# Patient Record
Sex: Female | Born: 1995
Health system: Southern US, Community
[De-identification: ages and names within clinical notes are randomized; demographics above are authoritative.]

## PROBLEM LIST (undated history)

## (undated) ENCOUNTER — Inpatient Hospital Stay: Payer: Self-pay

## (undated) ENCOUNTER — Emergency Department: Admission: EM | Discharge status: Absent without leave | Payer: Medicaid Other

## (undated) ENCOUNTER — Inpatient Hospital Stay (HOSPITAL_COMMUNITY): Payer: Self-pay

## (undated) DIAGNOSIS — F32A Anxiety disorder, unspecified: Secondary | ICD-10-CM

## (undated) DIAGNOSIS — I10 Essential (primary) hypertension: Secondary | ICD-10-CM

## (undated) DIAGNOSIS — J45909 Unspecified asthma, uncomplicated: Secondary | ICD-10-CM

## (undated) DIAGNOSIS — D649 Anemia, unspecified: Secondary | ICD-10-CM

## (undated) DIAGNOSIS — Z5189 Encounter for other specified aftercare: Secondary | ICD-10-CM

## (undated) DIAGNOSIS — T7840XA Allergy, unspecified, initial encounter: Secondary | ICD-10-CM

## (undated) DIAGNOSIS — F419 Anxiety disorder, unspecified: Secondary | ICD-10-CM

## (undated) DIAGNOSIS — O09299 Supervision of pregnancy with other poor reproductive or obstetric history, unspecified trimester: Secondary | ICD-10-CM

## (undated) DIAGNOSIS — F329 Major depressive disorder, single episode, unspecified: Secondary | ICD-10-CM

## (undated) DIAGNOSIS — F410 Panic disorder [episodic paroxysmal anxiety] without agoraphobia: Secondary | ICD-10-CM

## (undated) DIAGNOSIS — O24419 Gestational diabetes mellitus in pregnancy, unspecified control: Secondary | ICD-10-CM

## (undated) HISTORY — DX: Allergy, unspecified, initial encounter: T78.40XA

## (undated) HISTORY — DX: Encounter for other specified aftercare: Z51.89

## (undated) HISTORY — DX: Gestational diabetes mellitus in pregnancy, unspecified control: O24.419

## (undated) HISTORY — PX: OTHER SURGICAL HISTORY: SHX169

## (undated) HISTORY — DX: Panic disorder (episodic paroxysmal anxiety): F41.0

## (undated) HISTORY — DX: Anemia, unspecified: D64.9

## (undated) HISTORY — PX: WISDOM TOOTH EXTRACTION: SHX21

---
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
## 2014-03-14 ENCOUNTER — Emergency Department: Payer: Self-pay | Admitting: Emergency Medicine

## 2014-03-14 LAB — CBC WITH DIFFERENTIAL/PLATELET
Basophil #: 0.1 10*3/uL (ref 0.0–0.1)
Basophil %: 0.7 %
Eosinophil #: 0 10*3/uL (ref 0.0–0.7)
Eosinophil %: 0.1 %
HCT: 37 % (ref 35.0–47.0)
HGB: 12.9 g/dL (ref 12.0–16.0)
Lymphocyte #: 1.4 10*3/uL (ref 1.0–3.6)
Lymphocyte %: 14.4 %
MCH: 31 pg (ref 26.0–34.0)
MCHC: 34.8 g/dL (ref 32.0–36.0)
MCV: 89 fL (ref 80–100)
Monocyte #: 0.5 x10 3/mm (ref 0.2–0.9)
Monocyte %: 4.6 %
Neutrophil #: 7.9 10*3/uL — ABNORMAL HIGH (ref 1.4–6.5)
Neutrophil %: 80.2 %
Platelet: 206 10*3/uL (ref 150–440)
RBC: 4.15 10*6/uL (ref 3.80–5.20)
RDW: 14.5 % (ref 11.5–14.5)
WBC: 9.8 10*3/uL (ref 3.6–11.0)

## 2014-03-14 LAB — BASIC METABOLIC PANEL
Anion Gap: 5 — ABNORMAL LOW (ref 7–16)
BUN: 8 mg/dL (ref 7–18)
Calcium, Total: 8.9 mg/dL — ABNORMAL LOW (ref 9.0–10.7)
Chloride: 108 mmol/L — ABNORMAL HIGH (ref 98–107)
Co2: 23 mmol/L (ref 21–32)
Creatinine: 0.64 mg/dL (ref 0.60–1.30)
EGFR (African American): >60
EGFR (Non-African Amer.): >60
Glucose: 74 mg/dL (ref 65–99)
Osmolality: 269 (ref 275–301)
Potassium: 3.7 mmol/L (ref 3.5–5.1)
Sodium: 136 mmol/L (ref 136–145)

## 2014-04-26 ENCOUNTER — Observation Stay: Payer: Self-pay | Admitting: Obstetrics and Gynecology

## 2014-06-18 ENCOUNTER — Observation Stay: Payer: Self-pay | Admitting: Obstetrics and Gynecology

## 2014-06-18 LAB — PIH PROFILE
Anion Gap: 10 (ref 7–16)
BUN: 9 mg/dL (ref 7–18)
Calcium, Total: 8.9 mg/dL — ABNORMAL LOW (ref 9.0–10.7)
Chloride: 108 mmol/L — ABNORMAL HIGH (ref 98–107)
Co2: 23 mmol/L (ref 21–32)
Creatinine: 0.65 mg/dL (ref 0.60–1.30)
EGFR (African American): >60
EGFR (Non-African Amer.): >60
Glucose: 81 mg/dL (ref 65–99)
HCT: 34.5 % — ABNORMAL LOW (ref 35.0–47.0)
HGB: 11.8 g/dL — ABNORMAL LOW (ref 12.0–16.0)
MCH: 30.8 pg (ref 26.0–34.0)
MCHC: 34.1 g/dL (ref 32.0–36.0)
MCV: 90 fL (ref 80–100)
Osmolality: 279 (ref 275–301)
Platelet: 160 10*3/uL (ref 150–440)
Potassium: 3.9 mmol/L (ref 3.5–5.1)
RBC: 3.82 10*6/uL (ref 3.80–5.20)
RDW: 15 % — ABNORMAL HIGH (ref 11.5–14.5)
SGOT(AST): 21 U/L (ref 0–26)
Sodium: 141 mmol/L (ref 136–145)
Uric Acid: 3.6 mg/dL (ref 3.0–5.8)
WBC: 9.1 10*3/uL (ref 3.6–11.0)

## 2014-06-19 ENCOUNTER — Inpatient Hospital Stay: Payer: Self-pay | Admitting: Obstetrics and Gynecology

## 2014-06-19 LAB — CBC WITH DIFFERENTIAL/PLATELET
Basophil #: 0 10*3/uL (ref 0.0–0.1)
Basophil %: 0.5 %
Eosinophil #: 0 10*3/uL (ref 0.0–0.7)
Eosinophil %: 0.4 %
HCT: 35.5 % (ref 35.0–47.0)
HGB: 12.2 g/dL (ref 12.0–16.0)
Lymphocyte #: 1.7 10*3/uL (ref 1.0–3.6)
Lymphocyte %: 17.7 %
MCH: 31 pg (ref 26.0–34.0)
MCHC: 34.4 g/dL (ref 32.0–36.0)
MCV: 90 fL (ref 80–100)
Monocyte #: 0.7 x10 3/mm (ref 0.2–0.9)
Monocyte %: 7.8 %
Neutrophil #: 6.9 10*3/uL — ABNORMAL HIGH (ref 1.4–6.5)
Neutrophil %: 73.6 %
Platelet: 162 10*3/uL (ref 150–440)
RBC: 3.94 10*6/uL (ref 3.80–5.20)
RDW: 14.3 % (ref 11.5–14.5)
WBC: 9.3 10*3/uL (ref 3.6–11.0)

## 2014-06-19 LAB — COMPREHENSIVE METABOLIC PANEL
Albumin: 2.7 g/dL — ABNORMAL LOW (ref 3.8–5.6)
Alkaline Phosphatase: 127 U/L — ABNORMAL HIGH
Anion Gap: 11 (ref 7–16)
BUN: 7 mg/dL (ref 7–18)
Bilirubin,Total: 0.2 mg/dL (ref 0.2–1.0)
Calcium, Total: 8.5 mg/dL — ABNORMAL LOW (ref 9.0–10.7)
Chloride: 109 mmol/L — ABNORMAL HIGH (ref 98–107)
Co2: 20 mmol/L — ABNORMAL LOW (ref 21–32)
Creatinine: 0.65 mg/dL (ref 0.60–1.30)
EGFR (African American): >60
EGFR (Non-African Amer.): >60
Glucose: 86 mg/dL (ref 65–99)
Osmolality: 277 (ref 275–301)
Potassium: 4 mmol/L (ref 3.5–5.1)
SGOT(AST): 27 U/L — ABNORMAL HIGH (ref 0–26)
SGPT (ALT): 15 U/L
Sodium: 140 mmol/L (ref 136–145)
Total Protein: 6.7 g/dL (ref 6.4–8.6)

## 2014-06-19 LAB — PROTEIN / CREATININE RATIO, URINE
Creatinine, Urine: 79.2 mg/dL (ref 30.0–125.0)
Protein, Random Urine: 37 mg/dL — ABNORMAL HIGH (ref 0–12)
Protein/Creat. Ratio: 467 mg/gCREAT — ABNORMAL HIGH (ref 0–200)

## 2014-06-19 LAB — GC/CHLAMYDIA PROBE AMP

## 2014-06-20 LAB — HEMATOCRIT: HCT: 30.5 % — ABNORMAL LOW (ref 35.0–47.0)

## 2014-10-04 NOTE — L&D Delivery Note (Signed)
Obstetrical Delivery Note   Date of Delivery:   05/22/2015 at 0804 Primary OB:   [facility] Gestational Age/EDD: [redacted]w[redacted]d  Antepartum complications: preeclampsia with mild features, anemia  Delivered By:   Farrel Conners, CNM  Delivery Type:   spontaneous vaginal delivery  Procedure Details:   Began pushing when anterior lip. Was having variable decelerations after AROM, but progressed rapidly from 6 to 10 cm. Patient pushed effectively and cervix was reduced. After delivery of the head, there was a shoulder dystocia which did respond to [facility] and suprapubic pressure. Shoulder cord was reduced with birth. Baby had poor tone and was not breathing effectively, so cord was clamped then cut and baby was handed to neonatal nurse. Baby responded to tactile stimulation. Placenta appeared spontaneously at vaginal introitus shortly after birth and was easily delivered. Anesthesia:    none Intrapartum complications: Shoulder Dystocia and variable decelerations. Shoulder cord GBS:    negative Laceration:    n/a Episiotomy:    none Placenta:    Via active 3rd stage. To pathology: no Estimated Blood Loss:  400 ml  Baby:    Liveborn female, Apgars 6/9, weight 9#3oz (4170 gms)    Shaketha Jeon, CNM

## 2014-11-07 LAB — OB RESULTS CONSOLE RUBELLA ANTIBODY, IGM: Rubella: IMMUNE

## 2014-11-07 LAB — OB RESULTS CONSOLE GC/CHLAMYDIA
Chlamydia: NEGATIVE
Gonorrhea: NEGATIVE

## 2014-11-07 LAB — OB RESULTS CONSOLE RPR: RPR: NONREACTIVE

## 2014-11-07 LAB — OB RESULTS CONSOLE HIV ANTIBODY (ROUTINE TESTING): HIV: NONREACTIVE

## 2014-11-07 LAB — OB RESULTS CONSOLE ABO/RH: RH Type: POSITIVE

## 2014-11-07 LAB — OB RESULTS CONSOLE HEPATITIS B SURFACE ANTIGEN: Hepatitis B Surface Ag: NEGATIVE

## 2014-11-07 LAB — OB RESULTS CONSOLE ANTIBODY SCREEN: Antibody Screen: NEGATIVE

## 2015-02-11 NOTE — H&P (Signed)
L&D Evaluation:  History:  HPI -CC: HA, spots in vision and h/o dizziness -HPI: [age] y /o G1 @ 39/5 with above CC. Preg c/b h/o panic d/o and currently on zoloft. Patient states that her HAs began about 3 weeks ago and have been stable but persisting. She states that it occurs every day and lasts for a few hours, with spots in her vision occuring for about 2mins at the start of the HA. The HA is frontal and on both sides of her HA and has no aggravating or alleviating s/s. She doesn't drink any caffeiene, smoke and denies any GI or OB s/s except for occasional cramping. She has never tried any apap during these episodes and denies any current s/s and she denies any s/s prior to three weeks ago   She states that she had a h/o of some dizziness earlier in pregnancy but was seen by Dr. Dear and had a negative eval, including an ECG.  She has been evaluated a few times this pregnancy and has always had negative pre-eclampsia lab eval and has never been hypertensive.   Allergies albuterol, benadryl, buspar   Social History none   Exam:  Vital Signs AF VS normal and stable   General no apparent distress   Mental Status clear   Chest ctab   Heart rrr no mrgs   Abdomen gravid, non-tender   Reflexes 2+  brachial   FHT 130 baseline, +accels, no decels, mod var   Ucx q5959m   Impression:  Impression negative pre-eclampsia lab evaluation. ?migraine HAs   Plan:  Comments *IUP: rNST. no s/s of labor *HA: I told her that it sounds suspicious for migraines but options are limited with pregnancy. I told her that she can do apap prn, since she's never tried this; expectant management; or very limited fioricet use due to risk of rebound HA. I told her that I was hopeful that since it started so late in pregnancy that it'll resolve PP. CBC and CMP negative still; no need for PC ratio given recent 24hr urine and it wouldn't change her management.   She states that since it's not that severe and so  close to her due date that she'll do just apap only; she was counseled to avoid NSAIDs and call if she'd like a script for fioricet  She was amenable to this plan and has an NST/AFI already scheduled for her regular PNV.   Follow Up Appointment already scheduled   Electronic Signatures: [place] BingPickens, Leah Skora (MD)  (Signed 15-Sep-15 19:10)  Authored: L&D Evaluation   Last Updated: 15-Sep-15 19:10 by [place] BingPickens, Whittley Carandang (MD)

## 2015-02-11 NOTE — H&P (Signed)
L&D Evaluation:  History:  HPI -CC: SROM @ 2200 yesterday -HPI: [age] y /o G1 @ 39/6 with above CC. Preg c/b h/o panic d/o and currently on zoloft. SROM (fluid color unknown) at 2200 yesterday. Feeling regular UCs  (unsure frequency) and some blood streaks with fluid  She denies any HA,blurry vision/spots in vision   Patient's Medical History panic disorder   Patient's Surgical History none   Medications zoloft   Allergies albuterol, benadryl, buspar   Social History none   Exam:  Vital Signs AF VS normal and stable except for BP 130s-150s/80s-90s   General no apparent distress   Mental Status clear   Chest ctab   Heart rrr no mrgs   Abdomen gravid, non-tender   Estimated Fetal Weight Average for gestational age, 603600gm   Fetal Position ceph   Reflexes 2+  brachial   Pelvic 3/50 at 0500 (pt previously 3cm in the office)   Mebranes Ruptured, clear fluid, cx visually closed   FHT 120 baseline, +accels, no decels, mod var   Ucx irregular   Skin dry   Impression:  Impression s/p SROM with ?gHTN; pt stable   Plan:  Comments *ZOX:WRUEAVWUUP:category I tracing *gHTN: trend BPs q2963m and rpt urine and serum studies sent. no neuro s/s *SROM: pitocin augmentation given lack of cx change and regular UCs s/p SROM 7hrs ago to help decrease infection risk *GBS neg *psych: continue zoloft 50 qday  A pos/RI/Var NI/rpr neg/hiv and hepB neg/tdap unknown   Follow Up Appointment already scheduled   Electronic Signatures: [place] BingPickens, Odeal Welden (MD)  (Signed 16-Sep-15 06:26)  Authored: L&D Evaluation   Last Updated: 16-Sep-15 06:26 by [place] BingPickens, Zorana Brockwell (MD)

## 2015-02-12 ENCOUNTER — Emergency Department: Admission: EM | Admit: 2015-02-12 | Discharge: 2015-02-12 | Discharge status: Absent without leave | Payer: Self-pay

## 2015-02-12 ENCOUNTER — Observation Stay
Admission: RE | Admit: 2015-02-12 | Discharge: 2015-02-13 | Disposition: A | Discharge status: Home / Self Care | Payer: Medicaid Other | Source: Ambulatory Visit | Attending: Obstetrics & Gynecology | Admitting: Obstetrics & Gynecology

## 2015-02-12 ENCOUNTER — Encounter: Payer: Self-pay | Admitting: *Deleted

## 2015-02-12 DIAGNOSIS — Z3A26 26 weeks gestation of pregnancy: Secondary | ICD-10-CM | POA: Diagnosis not present

## 2015-02-12 DIAGNOSIS — O219 Vomiting of pregnancy, unspecified: Secondary | ICD-10-CM | POA: Diagnosis present

## 2015-02-12 DIAGNOSIS — Z3492 Encounter for supervision of normal pregnancy, unspecified, second trimester: Secondary | ICD-10-CM

## 2015-02-12 DIAGNOSIS — R112 Nausea with vomiting, unspecified: Secondary | ICD-10-CM | POA: Diagnosis present

## 2015-02-12 DIAGNOSIS — O2342 Unspecified infection of urinary tract in pregnancy, second trimester: Secondary | ICD-10-CM | POA: Diagnosis not present

## 2015-02-12 DIAGNOSIS — Z3490 Encounter for supervision of normal pregnancy, unspecified, unspecified trimester: Secondary | ICD-10-CM

## 2015-02-12 DIAGNOSIS — O212 Late vomiting of pregnancy: Principal | ICD-10-CM | POA: Insufficient documentation

## 2015-02-12 HISTORY — DX: Anxiety disorder, unspecified: F41.9

## 2015-02-12 HISTORY — DX: Depression, unspecified: F32.A

## 2015-02-12 HISTORY — DX: Major depressive disorder, single episode, unspecified: F32.9

## 2015-02-12 LAB — URINALYSIS COMPLETE WITH MICROSCOPIC (ARMC ONLY)
Bilirubin Urine: NEGATIVE
Glucose, UA: NEGATIVE mg/dL
Hgb urine dipstick: NEGATIVE
Nitrite: NEGATIVE
Protein, ur: NEGATIVE mg/dL
Specific Gravity, Urine: 1.014 (ref 1.005–1.030)
pH: 7 (ref 5.0–8.0)

## 2015-02-12 LAB — CBC
HCT: 29.7 % — ABNORMAL LOW (ref 35.0–47.0)
Hemoglobin: 10.1 g/dL — ABNORMAL LOW (ref 12.0–16.0)
MCH: 29.2 pg (ref 26.0–34.0)
MCHC: 33.9 g/dL (ref 32.0–36.0)
MCV: 86.3 fL (ref 80.0–100.0)
Platelets: 175 10*3/uL (ref 150–440)
RBC: 3.44 MIL/uL — ABNORMAL LOW (ref 3.80–5.20)
RDW: 13.8 % (ref 11.5–14.5)
WBC: 7.8 10*3/uL (ref 3.6–11.0)

## 2015-02-12 LAB — BASIC METABOLIC PANEL
Anion gap: 7 (ref 5–15)
BUN: 12 mg/dL (ref 6–20)
CO2: 22 mmol/L (ref 22–32)
Calcium: 8 mg/dL — ABNORMAL LOW (ref 8.9–10.3)
Chloride: 107 mmol/L (ref 101–111)
Creatinine, Ser: 0.45 mg/dL (ref 0.44–1.00)
GFR calc Af Amer: >60 mL/min (ref >60)
GFR calc non Af Amer: >60 mL/min (ref >60)
Glucose, Bld: 75 mg/dL (ref 65–99)
Potassium: 3.7 mmol/L (ref 3.5–5.1)
Sodium: 136 mmol/L (ref 135–145)

## 2015-02-12 MED ORDER — LACTATED RINGERS IV SOLN
INTRAVENOUS | Status: DC
  Administered 2015-02-12 (×3): via INTRAVENOUS
  Administered 2015-02-12: 500 mL via INTRAVENOUS
  Administered 2015-02-13: 06:00:00 via INTRAVENOUS

## 2015-02-12 MED ORDER — ONDANSETRON HCL 4 MG/2ML IJ SOLN
4.0000 mg | INTRAMUSCULAR | Status: DC | PRN
  Administered 2015-02-12: 4 mg via INTRAVENOUS

## 2015-02-12 MED ORDER — CEFTRIAXONE SODIUM IN DEXTROSE 20 MG/ML IV SOLN
1.0000 g | Freq: Two times a day (BID) | INTRAVENOUS | Status: DC
  Administered 2015-02-12 – 2015-02-13 (×3): 1 g via INTRAVENOUS
  Filled 2015-02-12 (×5): qty 50

## 2015-02-12 MED ORDER — ONDANSETRON HCL 4 MG/2ML IJ SOLN
INTRAMUSCULAR | Status: AC
  Administered 2015-02-12: 4 mg via INTRAVENOUS
  Filled 2015-02-12: qty 2

## 2015-02-12 MED ORDER — FAMOTIDINE IN NACL 20-0.9 MG/50ML-% IV SOLN
INTRAVENOUS | Status: AC
  Administered 2015-02-12: 20 mg via INTRAVENOUS
  Filled 2015-02-12: qty 50

## 2015-02-12 MED ORDER — FAMOTIDINE IN NACL 20-0.9 MG/50ML-% IV SOLN
20.0000 mg | Freq: Two times a day (BID) | INTRAVENOUS | Status: DC
  Administered 2015-02-12 – 2015-02-13 (×2): 20 mg via INTRAVENOUS
  Filled 2015-02-12: qty 50

## 2015-02-12 MED ORDER — LACTATED RINGERS IV SOLN: 500.0000 mL | INTRAVENOUS | Status: DC | PRN

## 2015-02-12 MED ORDER — ACETAMINOPHEN 500 MG PO TABS
1000.0000 mg | ORAL_TABLET | Freq: Four times a day (QID) | ORAL | Status: DC | PRN
  Administered 2015-02-12 – 2015-02-13 (×2): 1000 mg via ORAL
  Filled 2015-02-12 (×2): qty 2

## 2015-02-12 NOTE — H&P (Signed)
19 year old G2 P1001 with EDC=05/26/2015 by LMP 08/19/2014 presents at 25 2/7 weeks with c/o nausea and vomiting x2 days. She was seen in office yesterday for same problem. Urine dipstick at that time showed large leukocytes, small ketones, and +1 protein. Attempted OP treatment with Zofran ODT and cephalexin. Patient reports taking the Zofran twice, and vomiting once at home and once on arrival to hospital today. Able to keep down some gingerale. Denies diarrhea, fever. Has had some abdominal cramping and heartburn. Was feeling BH ctxs yesterday and had some LOF x 1 getting out of shower. SSE yesterday was negative as was wet prep. Cervix L/T/CL/OOP.  [facility]/depression (currently on zoloft 50 mgm), short interconceptual spacing with SVD 06/2014.   O: VSS. Afebrile General : in no acute distress, appears comfortable Mouth: moist mucous membranes Heart: RRR without murmur Lungs: CTA ABDomen: soft, mild tenderness in upper abdomen, BS active Uterus NT FHTs WNL with baseline 145 and accels to 160s Toco: No ctxs seen  A: Nausea and vomiting at 25 2/7 weeks Suspected UTI P: IV hydration, IV Zofran and IV pepcid  already begun. DC external fetal monitors Rocephin for UTI coverage. Urine culture pending at office Clear liquid sips Will transfer to floor

## 2015-02-12 NOTE — OB Triage Note (Signed)
Presents complaining of n/v since the evening of 02/10/2015.  States that she hasn't kept anything down since then with exception of some water on 5/10 and Ginger ale on 5/10.

## 2015-02-13 DIAGNOSIS — O219 Vomiting of pregnancy, unspecified: Secondary | ICD-10-CM | POA: Diagnosis present

## 2015-02-13 DIAGNOSIS — O212 Late vomiting of pregnancy: Secondary | ICD-10-CM | POA: Diagnosis not present

## 2015-02-13 MED ORDER — FAMOTIDINE 20 MG PO TABS: 20.0000 mg | ORAL_TABLET | Freq: Two times a day (BID) | ORAL | Status: DC

## 2015-02-13 NOTE — Progress Notes (Signed)
Patient understands all discharge instructions and the need to make follow up appointments. Patient discharge via wheelchair with auxillary. 

## 2015-02-13 NOTE — Discharge Instructions (Addendum)
Hyperemesis Gravidarum °Hyperemesis gravidarum is a severe form of nausea and vomiting that happens during pregnancy. Hyperemesis is worse than morning sickness. It may cause you to have nausea or vomiting all day for many days. It may keep you from eating and drinking enough food and liquids. Hyperemesis usually occurs during the first half (the first 20 weeks) of pregnancy. It often goes away once a woman is in her second half of pregnancy. However, sometimes hyperemesis continues through an entire pregnancy.  °CAUSES  °The cause of this condition is not completely known but is thought to be related to changes in the body's hormones when pregnant. It could be from the high level of the pregnancy hormone or an increase in estrogen in the body.  °SIGNS AND SYMPTOMS  °· Severe nausea and vomiting. °· Nausea that does not go away. °· Vomiting that does not allow you to keep any food down. °· Weight loss and body fluid loss (dehydration). °· Having no desire to eat or not liking food you have previously enjoyed. °DIAGNOSIS  °Your health care provider will do a physical exam and ask you about your symptoms. He or she may also order blood tests and urine tests to make sure something else is not causing the problem.  °TREATMENT  °You may only need medicine to control the problem. If medicines do not control the nausea and vomiting, you will be treated in the hospital to prevent dehydration, increased acid in the blood (acidosis), weight loss, and changes in the electrolytes in your body that may harm the unborn baby (fetus). You may need IV fluids.  °HOME CARE INSTRUCTIONS  °· Only take over-the-counter or prescription medicines as directed by your health care provider. °· Try eating a couple of dry crackers or toast in the morning before getting out of bed. °· Avoid foods and smells that upset your stomach. °· Avoid fatty and spicy foods. °· Eat 5-6 small meals a day. °· Do not drink when eating meals. Drink between  meals. °· For snacks, eat high-protein foods, such as cheese. °· Eat or suck on things that have ginger in them. Ginger helps nausea. °· Avoid food preparation. The smell of food can spoil your appetite. °· Avoid iron pills and iron in your multivitamins until after 3-4 months of being pregnant. However, consult with your health care provider before stopping any prescribed iron pills. °SEEK MEDICAL CARE IF:  °· Your abdominal pain increases. °· You have a severe headache. °· You have vision problems. °· You are losing weight. °SEEK IMMEDIATE MEDICAL CARE IF:  °· You are unable to keep fluids down. °· You vomit blood. °· You have constant nausea and vomiting. °· You have excessive weakness. °· You have extreme thirst. °· You have dizziness or fainting. °· You have a fever or persistent symptoms for more than 2-3 days. °· You have a fever and your symptoms suddenly get worse. °MAKE SURE YOU:  °· Understand these instructions. °· Will watch your condition. °· Will get help right away if you are not doing well or get worse. °Document Released: 09/20/2005 Document Revised: 07/11/2013 Document Reviewed: 05/02/2013 °ExitCare® Patient Information ©2015 [employer]. This information is not intended to replace advice given to you by your health care provider. Make sure you discuss any questions you have with your health care provider. ° °

## 2015-02-13 NOTE — Discharge Summary (Signed)
Antenatal Physician Discharge Summary  Patient ID: Erika Solomon MRN: [id number] DOB/AGE: [birth year] 21 y.o.Erika Solomon  Admit date: 02/12/2015 Discharge date: 02/13/2015  Admission Diagnoses: Nausea and vomiting in pregnancy, UTI  Discharge Diagnoses: same  Prenatal Procedures: none   Significant Diagnostic Studies:  Results for orders placed or performed during the hospital encounter of 02/12/15 (from the past 168 hour(s))  CBC   Collection Time: 02/12/15 10:41 AM  Result Value Ref Range   WBC 7.8 3.6 - 11.0 K/uL   RBC 3.44 (L) 3.80 - 5.20 MIL/uL   Hemoglobin 10.1 (L) 12.0 - 16.0 g/dL   HCT 95.629.7 (L) 38.735.0 - 56.447.0 %   MCV 86.3 80.0 - 100.0 fL   MCH 29.2 26.0 - 34.0 pg   MCHC 33.9 32.0 - 36.0 g/dL   RDW 33.213.8 95.111.5 - 88.414.5 %   Platelets 175 150 - 440 K/uL  Basic metabolic panel   Collection Time: 02/12/15 10:41 AM  Result Value Ref Range   Sodium 136 135 - 145 mmol/L   Potassium 3.7 3.5 - 5.1 mmol/L   Chloride 107 101 - 111 mmol/L   CO2 22 22 - 32 mmol/L   Glucose, Bld 75 65 - 99 mg/dL   BUN 12 6 - 20 mg/dL   Creatinine, Ser 1.660.45 0.44 - 1.00 mg/dL   Calcium 8.0 (L) 8.9 - 10.3 mg/dL   GFR calc non Af Amer >60 >60 mL/min   GFR calc Af Amer >60 >60 mL/min   Anion gap 7 5 - 15  Urinalysis complete, with microscopic [facility])   Collection Time: 02/12/15 2:46 PM  Result Value Ref Range   Color, Urine YELLOW (A) YELLOW   APPearance CLOUDY (A) CLEAR   Glucose, UA NEGATIVE NEGATIVE mg/dL   Bilirubin Urine NEGATIVE NEGATIVE   Ketones, ur TRACE (A) NEGATIVE mg/dL   Specific Gravity, Urine 1.014 1.005 - 1.030   Hgb urine dipstick NEGATIVE NEGATIVE   pH 7.0 5.0 - 8.0   Protein, ur NEGATIVE NEGATIVE mg/dL   Nitrite NEGATIVE NEGATIVE   Leukocytes, UA 3+ (A) NEGATIVE   RBC / HPF 0-5 0 - 5 RBC/hpf   WBC, UA 6-30 0 - 5 WBC/hpf   Bacteria,  UA RARE (A) NONE SEEN   Squamous Epithelial / LPF 6-30 (A) NONE SEEN   Mucous PRESENT    Hyaline Casts, UA PRESENT     Treatments: IV hydration and antibiotics: ceftriaxone  Hospital Course:  This is a 19 y.o. G2P1001 with IUP at 7374w3d admitted for nausea and vomiting of pregnancy. She was observed, fetal heart tones were obtained, and she had no signs/symptoms of preterm labor or other maternal-fetal concerns. She was given antiemetics and antibiotics and was able to tolerate po intake. She was deemed stable for discharge to home with outpatient follow up.  Discharge Exam: BP 115/67 mmHg  Pulse 71  Temp(Src) 98.6 F (37 C) (Oral)  Resp 20  Ht 5' 3.5" (1.613 m)  Wt 77.111 kg (170 lb)  BMI 29.64 kg/m2  SpO2 100%  LMP 08/19/2014 CVS exam: normal rate, regular rhythm, normal S1, S2, no murmurs, rubs, clicks or gallops. Lungs - Normal respiratory effort, chest expands symmetrically. Lungs are clear to auscultation, no crackles or wheezes. ABD: soft, non-tender to palpation, gravid abdomen   Discharge Condition: Stable  Disposition: stable for home     Medication List    TAKE these medications       famotidine 20 MG tablet  Commonly known as: PEPCID  Take  1 tablet (20 mg total) by mouth 2 (two) times daily.     ondansetron 4 MG disintegrating tablet  Commonly known as: ZOFRAN-ODT  Take 4 mg by mouth every 8 (eight) hours as needed for nausea or vomiting.     prenatal multivitamin Tabs tablet  Take 1 tablet by mouth daily.           Follow-up Information    [facility] Schedule an appointment as soon as possible for a visit in 3 weeks.   Why: keep scheduled 28 week appointment, will do labs at that visit   Contact information:   [phone]           Signed: Jannet MantisSubudhi, Jeslynn Hollander, CNM. 02/13/2015, 10:26 AM

## 2015-04-23 ENCOUNTER — Observation Stay
Admission: EM | Admit: 2015-04-23 | Discharge: 2015-04-24 | Payer: Medicaid Other | Attending: Obstetrics & Gynecology | Admitting: Obstetrics & Gynecology

## 2015-04-23 ENCOUNTER — Encounter: Payer: Self-pay | Admitting: *Deleted

## 2015-04-23 DIAGNOSIS — O368390 Maternal care for abnormalities of the fetal heart rate or rhythm, unspecified trimester, not applicable or unspecified: Secondary | ICD-10-CM | POA: Diagnosis present

## 2015-04-23 DIAGNOSIS — O368130 Decreased fetal movements, third trimester, not applicable or unspecified: Secondary | ICD-10-CM | POA: Insufficient documentation

## 2015-04-23 DIAGNOSIS — Z3A35 35 weeks gestation of pregnancy: Secondary | ICD-10-CM | POA: Insufficient documentation

## 2015-04-23 DIAGNOSIS — O4700 False labor before 37 completed weeks of gestation, unspecified trimester: Secondary | ICD-10-CM | POA: Diagnosis present

## 2015-04-23 LAB — URINALYSIS COMPLETE WITH MICROSCOPIC (ARMC ONLY)
Bacteria, UA: NONE SEEN
Bilirubin Urine: NEGATIVE
Glucose, UA: NEGATIVE mg/dL
Hgb urine dipstick: NEGATIVE
Nitrite: NEGATIVE
Protein, ur: NEGATIVE mg/dL
RBC / HPF: NONE SEEN RBC/hpf (ref 0–5)
Specific Gravity, Urine: 1.003 — ABNORMAL LOW (ref 1.005–1.030)
pH: 7 (ref 5.0–8.0)

## 2015-04-23 MED ORDER — NIFEDIPINE 10 MG PO CAPS
10.0000 mg | ORAL_CAPSULE | Freq: Once | ORAL | Status: AC
  Administered 2015-04-23: 10 mg via ORAL
  Filled 2015-04-23: qty 1

## 2015-04-23 MED ORDER — LACTATED RINGERS IV SOLN: 500.0000 mL | INTRAVENOUS | Status: DC | PRN

## 2015-04-23 MED ORDER — CEPHALEXIN 500 MG PO CAPS
500.0000 mg | ORAL_CAPSULE | Freq: Three times a day (TID) | ORAL | Status: DC
  Administered 2015-04-23: 500 mg via ORAL
  Filled 2015-04-23 (×5): qty 1

## 2015-04-23 NOTE — OB Triage Note (Signed)
Recvd to L&D obs 4 with c/o decreased fetal movement and increased discharge.  Changed to gown and to bed.  EFM applied.

## 2015-04-24 DIAGNOSIS — O4700 False labor before 37 completed weeks of gestation, unspecified trimester: Secondary | ICD-10-CM | POA: Diagnosis not present

## 2015-04-24 NOTE — Discharge Instructions (Signed)
Braxton Hicks Contractions °Contractions of the uterus can occur throughout pregnancy. Contractions are not always a sign that you are in labor.  °WHAT ARE BRAXTON HICKS CONTRACTIONS?  °Contractions that occur before labor are called Braxton Hicks contractions, or false labor. Toward the end of pregnancy (32-34 weeks), these contractions can develop more often and may become more forceful. This is not true labor because these contractions do not result in opening (dilatation) and thinning of the cervix. They are sometimes difficult to tell apart from true labor because these contractions can be forceful and people have different pain tolerances. You should not feel embarrassed if you go to the hospital with false labor. Sometimes, the only way to tell if you are in true labor is for your health care provider to look for changes in the cervix. °If there are no prenatal problems or other health problems associated with the pregnancy, it is completely safe to be sent home with false labor and await the onset of true labor. °HOW CAN YOU TELL THE DIFFERENCE BETWEEN TRUE AND FALSE LABOR? °False Labor °· The contractions of false labor are usually shorter and not as hard as those of true labor.   °· The contractions are usually irregular.   °· The contractions are often felt in the front of the lower abdomen and in the groin.   °· The contractions may go away when you walk around or change positions while lying down.   °· The contractions get weaker and are shorter lasting as time goes on.   °· The contractions do not usually become progressively stronger, regular, and closer together as with true labor.   °True Labor °· Contractions in true labor last 30-70 seconds, become very regular, usually become more intense, and increase in frequency.   °· The contractions do not go away with walking.   °· The discomfort is usually felt in the top of the uterus and spreads to the lower abdomen and low back.   °· True labor can be  determined by your health care provider with an exam. This will show that the cervix is dilating and getting thinner.   °WHAT TO REMEMBER °· Keep up with your usual exercises and follow other instructions given by your health care provider.   °· Take medicines as directed by your health care provider.   °· Keep your regular prenatal appointments.   °· Eat and drink lightly if you think you are going into labor.   °· If Braxton Hicks contractions are making you uncomfortable:   °¨ Change your position from lying down or resting to walking, or from walking to resting.   °¨ Sit and rest in a tub of warm water.   °¨ Drink 2-3 glasses of water. Dehydration may cause these contractions.   °¨ Do slow and deep breathing several times an hour.   °WHEN SHOULD I SEEK IMMEDIATE MEDICAL CARE? °Seek immediate medical care if: °· Your contractions become stronger, more regular, and closer together.   °· You have fluid leaking or gushing from your vagina.   °· You have a fever.   °· You pass blood-tinged mucus.   °· You have vaginal bleeding.   °· You have continuous abdominal pain.   °· You have low back pain that you never had before.   °· You feel your baby's head pushing down and causing pelvic pressure.   °· Your baby is not moving as much as it used to.   °Document Released: 09/20/2005 Document Revised: 09/25/2013 Document Reviewed: 07/02/2013 °ExitCare® Patient Information ©2015 [employer]. This information is not intended to replace advice given to you by your health care   provider. Make sure you discuss any questions you have with your health care provider. ° °

## 2015-04-24 NOTE — OB Triage Provider Note (Signed)
OB History & Physical   History of Present Illness:  Chief Complaint: Presented to office with c/o decreased FM since 19 July PM. And LOF x weeks. Had a NST in office which was reactive, but there were two fetal heart rate decelerations to 120 from a baseline of 150. She presents for monitoring and evaluation of c/o LOF/ Has been feeling some cramping, but denies VB, dysuria, vulvovaginal itching, or diarrhea. Has had nausea thru out her pregnancy and heartburn. Has not eaten all day 20 July, drinking some fluids.  HPI:  Erika Solomon is a 19 y.o. G3P1001 female with EDC=05/26/2015  at [redacted]w[redacted]d dated by LMP=9wk ultrasound.  Her pregnancy has been complicated by persistent nausea, close interconceptual spacing, hx of anxiety, and overnight hospitalization in second trimester for N/V. Has gained 20# with pregnancy.  A SSE was negative for PROM, and the AFI was 18 cm. The prolonged NST was nicely reactive, CAT1, but patient was contracting every 2-3 minutes on arrival. Her contractions spaced out a little with po hydration, but they resumed at  q3-5 min apart. Patient feeling more cramping with the contractions. Cervix changed slightly from FT/40% to 1cm and 50%. Urinalysis also with 6-30 WBCs and suspicious for a UTI. She was given Keflex and a dose of procardia at 2218. She was held for monitoring of labor contractions/ cervical change.    Prenatal care site: Prenatal care at [facility] has also been remarkable for early onset of care, normal anatomy scan except for single EIF, negative first trimester test, iron deficiency anemia, and mildly decreased vitamin B12 level. She had been on Zoloft earlier in pregnancy, but stopped taking. Has received one injection of vitamin B12 1000 mcg.       Maternal Medical History:   Past Medical History  Diagnosis Date  . Anxiety and depression   Iron deficiency anemia  Past Surgical History  Procedure Laterality Date  . No past surgery      Allergies   Allergen Reactions  . Albuterol Anaphylaxis  . Benadryl [Diphenhydramine] Anaphylaxis    Prior to Admission medications   Medication Sig Start Date End Date Taking? Authorizing Provider  famotidine (PEPCID) 20 MG tablet Take 1 tablet (20 mg total) by mouth 2 (two) times daily. 02/13/15   Chelsea C Ward, MD  ondansetron (ZOFRAN-ODT) 4 MG disintegrating tablet Take 4 mg by mouth every 8 (eight) hours as needed for nausea or vomiting.    Historical Provider, MD  Prenatal Vit-Fe Fumarate-FA (PRENATAL MULTIVITAMIN) TABS tablet Take 1 tablet by mouth daily.     Historical Provider, MD   UA: +1ketones, 1.003, 2+ leuks, 6-30 WBC, 0-5epi cells    Social History: She  reports that she has quit smoking. She does not have any smokeless tobacco history on file. She reports that she does not drink alcohol or use illicit drugs.  Family History: family history includes Diabetes in her maternal grandfather; Hypertension in her father and maternal grandmother.   Review of Systems: Negative x 10 systems reviewed except as noted in the HPI.      Physical Exam:  Vital Signs: BP 131/73 mmHg  Pulse 75  Temp(Src) 98.3 F (36.8 C) (Oral)  Resp 18  LMP 08/19/2014 General: no acute distress.  HEENT: normocephalic, atraumatic Heart: regular rate & rhythm.  No murmurs Lungs: clear to auscultation bilaterally Abdomen: soft, gravid, non-tender;  Pelvic:   External: Normal external female genitalia  Cervix: initially FT/30-40%/-2, then after 2 hours 1/50%/-3  Wet [place]: negative  clue cells, hyphae, Trichimonas  ROM: negative Nitrazine, ferning, pooling Extremities: non-tender, symmetric, no edema bilaterally.   Neurologic: Alert & oriented x 3.    Pertinent Results:  Prenatal Labs: Blood type/Rh A positive  Antibody screen negative  Rubella immune  RPR negative  HBsAg negative  HIV negative  GC negative  Chlamydia negative  Genetic screening Negative first trimester test.  1 hour GTT 94  3  hour GTT N/A  GBS unknown   Baseline FHR: 125-130 baseline and accelerations to 150s to 170, moderate variability, no decelerations Toco: contractions q4-5 minutesx 40 sec. 4.0cm+Bedside Ultrasound:  Number of Fetus: single  Presentation: vertex  Fluid: 18.1cm=4cm+ 4.3+5.3+4.5cm   Assessment:  Erika Solomon is a 19 y.o. G12P1001 female at [redacted]w[redacted]d with threatened preterm labor. Possible UTI  Plan:  1. Continue to monitor for cervical change 2. Urine culture 3. Continue po hydration.  Tierre Gerard  04/24/2015 3:52 AM

## 2015-04-25 NOTE — Progress Notes (Signed)
Late triage note for 04/24/2015 at 0430 AM   S: Patient comfortable, sleeping, no longer feeling lower abdominal cramping O:  Appears comfortable Baby very active. FHR 150 with accels to 170s to 180s, no decelerations Essentially acontractile over last 2 hours Cervical exam at 0051-no change  A: IUP at 35 3/7 weeks with threatened preterm labor-resolved Possible UTI  P: Discussed earlier with patient option for BMZ with preterm deliver Patient declines at this time.  Discharge home with preterm labor precautions RX for Keflex 500 mgm TID x 7 days Urine culture pending FU at [facility] as scheduled.  Farrel Conners, CNM

## 2015-04-26 LAB — URINE CULTURE

## 2015-05-21 ENCOUNTER — Encounter: Payer: Self-pay | Admitting: *Deleted

## 2015-05-21 ENCOUNTER — Inpatient Hospital Stay
Admission: EM | Admit: 2015-05-21 | Discharge: 2015-05-23 | DRG: 774 | Disposition: A | Discharge status: Home / Self Care | Payer: Medicaid Other | Attending: Obstetrics & Gynecology | Admitting: Obstetrics & Gynecology

## 2015-05-21 DIAGNOSIS — Z3490 Encounter for supervision of normal pregnancy, unspecified, unspecified trimester: Secondary | ICD-10-CM

## 2015-05-21 DIAGNOSIS — Z888 Allergy status to other drugs, medicaments and biological substances status: Secondary | ICD-10-CM

## 2015-05-21 DIAGNOSIS — Z87891 Personal history of nicotine dependence: Secondary | ICD-10-CM

## 2015-05-21 DIAGNOSIS — O9902 Anemia complicating childbirth: Secondary | ICD-10-CM | POA: Diagnosis present

## 2015-05-21 DIAGNOSIS — Z3A39 39 weeks gestation of pregnancy: Secondary | ICD-10-CM | POA: Diagnosis present

## 2015-05-21 DIAGNOSIS — O1403 Mild to moderate pre-eclampsia, third trimester: Secondary | ICD-10-CM | POA: Diagnosis present

## 2015-05-21 DIAGNOSIS — Z79899 Other long term (current) drug therapy: Secondary | ICD-10-CM | POA: Diagnosis not present

## 2015-05-21 DIAGNOSIS — O09299 Supervision of pregnancy with other poor reproductive or obstetric history, unspecified trimester: Secondary | ICD-10-CM | POA: Diagnosis present

## 2015-05-21 LAB — CBC
HCT: 29.7 % — ABNORMAL LOW (ref 35.0–47.0)
Hemoglobin: 9.7 g/dL — ABNORMAL LOW (ref 12.0–16.0)
MCH: 26.5 pg (ref 26.0–34.0)
MCHC: 32.8 g/dL (ref 32.0–36.0)
MCV: 80.8 fL (ref 80.0–100.0)
Platelets: 147 10*3/uL — ABNORMAL LOW (ref 150–440)
RBC: 3.67 MIL/uL — ABNORMAL LOW (ref 3.80–5.20)
RDW: 16.4 % — ABNORMAL HIGH (ref 11.5–14.5)
WBC: 7.9 10*3/uL (ref 3.6–11.0)

## 2015-05-21 LAB — COMPREHENSIVE METABOLIC PANEL
ALT: 12 U/L — ABNORMAL LOW (ref 14–54)
AST: 29 U/L (ref 15–41)
Albumin: 2.8 g/dL — ABNORMAL LOW (ref 3.5–5.0)
Alkaline Phosphatase: 81 U/L (ref 38–126)
Anion gap: 6 (ref 5–15)
BUN: 11 mg/dL (ref 6–20)
CO2: 22 mmol/L (ref 22–32)
Calcium: 8.3 mg/dL — ABNORMAL LOW (ref 8.9–10.3)
Chloride: 105 mmol/L (ref 101–111)
Creatinine, Ser: 0.62 mg/dL (ref 0.44–1.00)
GFR calc Af Amer: >60 mL/min (ref >60)
GFR calc non Af Amer: >60 mL/min (ref >60)
Glucose, Bld: 109 mg/dL — ABNORMAL HIGH (ref 65–99)
Potassium: 3.5 mmol/L (ref 3.5–5.1)
Sodium: 133 mmol/L — ABNORMAL LOW (ref 135–145)
Total Bilirubin: 0.3 mg/dL (ref 0.3–1.2)
Total Protein: 6 g/dL — ABNORMAL LOW (ref 6.5–8.1)

## 2015-05-21 LAB — PROTEIN / CREATININE RATIO, URINE
Creatinine, Urine: 183 mg/dL
Protein Creatinine Ratio: 0.5 mg/mg{Cre} — ABNORMAL HIGH (ref 0.00–0.15)
Total Protein, Urine: 91 mg/dL

## 2015-05-21 LAB — ABO/RH: ABO/RH(D): A POS

## 2015-05-21 LAB — TYPE AND SCREEN
ABO/RH(D): A POS
Antibody Screen: NEGATIVE

## 2015-05-21 MED ORDER — LACTATED RINGERS IV SOLN
500.0000 mL | INTRAVENOUS | Status: DC | PRN
  Administered 2015-05-22: 500 mL via INTRAVENOUS

## 2015-05-21 MED ORDER — DINOPROSTONE 10 MG VA INST
10.0000 mg | VAGINAL_INSERT | Freq: Once | VAGINAL | Status: AC
  Administered 2015-05-21: 10 mg via VAGINAL
  Filled 2015-05-21: qty 1

## 2015-05-21 MED ORDER — LABETALOL HCL 5 MG/ML IV SOLN: 20.0000 mg | INTRAVENOUS | Status: DC | PRN

## 2015-05-21 MED ORDER — OXYTOCIN 40 UNITS IN LACTATED RINGERS INFUSION - SIMPLE MED
62.5000 mL/h | INTRAVENOUS | Status: DC
  Administered 2015-05-22: 500 mL/h via INTRAVENOUS
  Filled 2015-05-21: qty 1000

## 2015-05-21 MED ORDER — OXYTOCIN BOLUS FROM INFUSION: 500.0000 mL | INTRAVENOUS | Status: DC

## 2015-05-21 MED ORDER — AMMONIA AROMATIC IN INHA: 0.3000 mL | Freq: Once | RESPIRATORY_TRACT | Status: DC | PRN

## 2015-05-21 MED ORDER — HYDRALAZINE HCL 20 MG/ML IJ SOLN: 5.0000 mg | INTRAMUSCULAR | Status: DC | PRN

## 2015-05-21 MED ORDER — ONDANSETRON HCL 4 MG/2ML IJ SOLN: 4.0000 mg | Freq: Four times a day (QID) | INTRAMUSCULAR | Status: DC | PRN

## 2015-05-21 MED ORDER — LACTATED RINGERS IV SOLN
INTRAVENOUS | Status: DC
  Administered 2015-05-21: 23:00:00 via INTRAVENOUS

## 2015-05-21 MED ORDER — MISOPROSTOL 200 MCG PO TABS
800.0000 ug | ORAL_TABLET | Freq: Once | ORAL | Status: DC | PRN
  Filled 2015-05-21: qty 4

## 2015-05-21 MED ORDER — LIDOCAINE HCL (PF) 1 % IJ SOLN
30.0000 mL | INTRAMUSCULAR | Status: DC | PRN
  Filled 2015-05-21: qty 30

## 2015-05-21 NOTE — OB Triage Note (Signed)
PIH evaluation sent from the office.

## 2015-05-21 NOTE — H&P (Signed)
HISTORY AND PHYSICAL  HISTORY OF PRESENT ILLNESS: Erika Solomon is a 19 y.o. G2P1001 at [redacted]w[redacted]d by LMP consistent with a 9 week ultrasound with a pregnancy complicated by preeclampsia without severe features presenting for induction of labor. Patient was sent from office today with elevated blood pressures and +1 proteinuria. Blood pressures in the L&D were 130s/78 to 94. PIH labs were essentially WNL (LFTS, BUN, CR) except for mild thrombocytopenia (147K) and a PC ratio of 500 mgm. She reports intermittent RUQ pain, dizziness and bitemporal headaches.  She has been having contractions Q5 min when arrived on unit for [facility] evaluation but denied leakage of fluid, frank vaginal bleeding, or decreased fetal movement. She has had a little spotting after a cervical exam this week.  Prenatal care at [facility] remarkable for anxiety and depression (was on Zoloft earlier this pregnancy), short interconceptual spacing, anemia, reflux, and a history of preeclampsia with G1. Had an SVD after a Pitocin augmented labor  In Sept 2015 delivering a 7#1 oz son.   HISTORY:  Past Medical History  Diagnosis Date  . Anxiety and depression     Past Surgical History  Procedure Laterality Date  . No past surgery      No current facility-administered medications on file prior to encounter.   Current Outpatient Prescriptions on File Prior to Encounter  Medication Sig Dispense Refill  . famotidine (PEPCID) 20 MG tablet Take 1 tablet (20 mg total) by mouth 2 (two) times daily. 60 tablet 4  . ondansetron (ZOFRAN-ODT) 4 MG disintegrating tablet Take 4 mg by mouth every 8 (eight) hours as needed for nausea or vomiting.    . Prenatal Vit-Fe Fumarate-FA (PRENATAL MULTIVITAMIN) TABS tablet Take 1 tablet by mouth daily.        Allergies  Allergen Reactions  . Albuterol Anaphylaxis  . Benadryl [Diphenhydramine] Anaphylaxis  . Buspar [Buspirone] Anaphylaxis  . Strawberry Anaphylaxis    OB History  Gravida Para Term  Preterm AB SAB TAB Ectopic Multiple Living  0 0 0 0 0 0 1    # Outcome Date GA Lbr Len/2nd Weight Sex Delivery Anes PTL Lv  2 Current           1 Term 06/19/14   7 lb 1 oz (3.204 kg) M Vag-Spont  N Y       Social History  Substance Use Topics  . Smoking status: Former Games developer  . Smokeless tobacco: None  . Alcohol Use: No    PHYSICAL EXAM: Temp:  [98.3 F (36.8 C)] 98.3 F (36.8 C) (08/17 2031) Pulse Rate:  [78-92] 78 (08/17 2031) Resp:  [18] 18 (08/17 2031) BP: (132-147)/(78-94) 147/82 mmHg (08/17 2031) Weight:  [170 lb (77.111 kg)] 170 lb (77.111 kg) (08/17 1914)  GENERAL: NAD AAOx3 CHEST:CTAB no increased work of breathing CV:RRR no appreciable murmurs ABDOMEN: gravid, nontender, EFW 7 1/2# by [place] EXTREMITIES:  Warm and well-perfused, nontender, nonedematous, +1 DTRs  CERVIX: 3.5/ 30%/-1 to -2/ vtx/posterior   FHT:130 baseline with accelerations to 170s, no decelerations  Toco:q5 min apart earlier today, now irregular DIAGNOSTIC STUDIES:  Recent Labs Lab 05/21/15 1430  WBC 7.9  HGB 9.7*  HCT 29.7*  PLT 147*  NA 133*  K 3.5  CL 105  CO2 22  BUN 11  CREATININE 0.62  GLUCOSE 109*  CALCIUM 8.3*  ALKPHOS 81  AST 29  ALT 12*  PROT 6.0*    PRENATAL STUDIES:  Prenatal Labs:  MBT: A POS, Rubella immune,  Varicella immune, HIV neg, RPR neg, Hep B neg, GC/CT neg, GBSnegative, glucola94    ASSESSMENT AND PLAN: IUP at 39.2 weeks with preeclampsia without severe features Discussed plan to induce labor since term with preeclampsia. Will use Cervidil tonight since cervix thick and posterior. Reviewed risks of induction including hyperstimulation, FITL, failed induction and Cesarean section. Monitor blood pressures closely  1. Fetal Well being  - Fetal Tracing: Cat 1 - Group B Streptococcus: negative  2. Routine OB: - Prenatal labs reviewed, as above - Rh positive 3. Induction of Labor:  -  Contractions irregular-  Pelvis proven to 7#1oz -   Plan for induction withCervidil  4. Post Partum Planning: - Infant feeding:bottle - Contraception: Jinny Blossom, CNM

## 2015-05-22 DIAGNOSIS — O1490 Unspecified pre-eclampsia, unspecified trimester: Secondary | ICD-10-CM

## 2015-05-22 LAB — RPR: RPR Ser Ql: NONREACTIVE

## 2015-05-22 MED ORDER — DOCUSATE SODIUM 100 MG PO CAPS
100.0000 mg | ORAL_CAPSULE | Freq: Two times a day (BID) | ORAL | Status: DC
  Administered 2015-05-23 (×2): 100 mg via ORAL
  Filled 2015-05-22 (×2): qty 1

## 2015-05-22 MED ORDER — DIBUCAINE 1 % RE OINT: 1.0000 "application " | TOPICAL_OINTMENT | RECTAL | Status: DC | PRN

## 2015-05-22 MED ORDER — BENZOCAINE-MENTHOL 20-0.5 % EX AERO: 1.0000 "application " | INHALATION_SPRAY | CUTANEOUS | Status: DC | PRN

## 2015-05-22 MED ORDER — LANOLIN HYDROUS EX OINT: TOPICAL_OINTMENT | CUTANEOUS | Status: DC | PRN

## 2015-05-22 MED ORDER — BUTORPHANOL TARTRATE 1 MG/ML IJ SOLN
2.0000 mg | INTRAMUSCULAR | Status: DC | PRN
  Administered 2015-05-22: 2 mg via INTRAVENOUS
  Administered 2015-05-22: 1 mg via INTRAVENOUS
  Filled 2015-05-22: qty 1
  Filled 2015-05-22: qty 2
  Filled 2015-05-22: qty 1

## 2015-05-22 MED ORDER — ZOLPIDEM TARTRATE 5 MG PO TABS: 5.0000 mg | ORAL_TABLET | Freq: Every evening | ORAL | Status: DC | PRN

## 2015-05-22 MED ORDER — ONDANSETRON HCL 4 MG/2ML IJ SOLN: 4.0000 mg | INTRAMUSCULAR | Status: DC | PRN

## 2015-05-22 MED ORDER — ACETAMINOPHEN 325 MG PO TABS: 650.0000 mg | ORAL_TABLET | ORAL | Status: DC | PRN

## 2015-05-22 MED ORDER — LACTATED RINGERS IV SOLN: INTRAVENOUS | Status: DC

## 2015-05-22 MED ORDER — IBUPROFEN 600 MG PO TABS
600.0000 mg | ORAL_TABLET | Freq: Four times a day (QID) | ORAL | Status: DC
  Administered 2015-05-22 – 2015-05-23 (×3): 600 mg via ORAL
  Filled 2015-05-22 (×3): qty 1

## 2015-05-22 MED ORDER — WITCH HAZEL-GLYCERIN EX PADS: 1.0000 "application " | MEDICATED_PAD | CUTANEOUS | Status: DC | PRN

## 2015-05-22 MED ORDER — ONDANSETRON HCL 4 MG PO TABS: 4.0000 mg | ORAL_TABLET | ORAL | Status: DC | PRN

## 2015-05-22 MED ORDER — FERROUS SULFATE 325 (65 FE) MG PO TABS
325.0000 mg | ORAL_TABLET | Freq: Every day | ORAL | Status: DC
  Administered 2015-05-23: 325 mg via ORAL
  Filled 2015-05-22: qty 1

## 2015-05-22 MED ORDER — OXYCODONE-ACETAMINOPHEN 5-325 MG PO TABS: 1.0000 | ORAL_TABLET | ORAL | Status: DC | PRN

## 2015-05-22 MED ORDER — SIMETHICONE 80 MG PO CHEW: 80.0000 mg | CHEWABLE_TABLET | ORAL | Status: DC | PRN

## 2015-05-22 MED ORDER — PRENATAL MULTIVITAMIN CH
1.0000 | ORAL_TABLET | Freq: Every day | ORAL | Status: DC
  Administered 2015-05-22 – 2015-05-23 (×2): 1 via ORAL
  Filled 2015-05-22 (×2): qty 1

## 2015-05-23 LAB — HEMOGLOBIN AND HEMATOCRIT, BLOOD
HCT: 28.1 % — ABNORMAL LOW (ref 35.0–47.0)
Hemoglobin: 9.2 g/dL — ABNORMAL LOW (ref 12.0–16.0)

## 2015-05-23 MED ORDER — IBUPROFEN 600 MG PO TABS: 600.0000 mg | ORAL_TABLET | Freq: Four times a day (QID) | ORAL | Status: DC | PRN

## 2015-05-23 MED ORDER — DOCUSATE SODIUM 100 MG PO CAPS: 100.0000 mg | ORAL_CAPSULE | Freq: Two times a day (BID) | ORAL | Status: DC | PRN

## 2015-05-23 NOTE — Discharge Summary (Signed)
Physician Obstetric Discharge Summary  Patient ID: Erika Solomon MRN: [id number] DOB/AGE: [birth year] 18 y.o.   Date of Admission: 05/21/2015  Date of Delivery: 05/22/2015  Date of Discharge:   Admitting Diagnosis: Preeclampsia without severe features at [redacted]w[redacted]d  Secondary Diagnosis: Anemia in pregnancy  Mode of Delivery: normal spontaneous vaginal delivery     Discharge Diagnosis: Preeclampsia (mild), Shoulder dystocia, Term pregnancy-delivered   Intrapartum Procedures: Atificial rupture of membranes and Cervidil ripening   Post partum procedures: none  Complications: Shoulder dystocia, Shoulder cord, Variable decelerations   Brief Hospital Course  Erika Solomon is a [id number] who had a SVD on 05/22/2015;  for further details of this delivery, please refer to the delivery note.  Patient had an uncomplicated postpartum course.  By time of discharge on PPD#1, her pain was controlled on oral pain medications; she had appropriate lochia and was ambulating, voiding without difficulty and tolerating regular diet.  She was deemed stable for discharge to home.  BPs normal with two mild range BPs in the PP period  Labs: CBC Latest Ref Rng 05/23/2015 05/21/2015 02/12/2015  WBC 3.6 - 11.0 K/uL - 7.9 7.8  Hemoglobin 12.0 - 16.0 g/dL 1.4(N) 8.2(N) 10.1(L)  Hematocrit 35.0 - 47.0 % 28.1(L) 29.7(L) 29.7(L)  Platelets 150 - 440 K/uL - 147(L) 175   A POS  Physical exam:  Blood pressure 131/79, pulse 76, temperature 97.7 F (36.5 C), temperature source Oral, resp. rate 14, height  (1.6 m), weight 170 lb (77.111 kg), last menstrual period 08/19/2014, SpO2 97 %, unknown if currently breastfeeding. Benign  Discharge Instructions: Per After Visit Summary. Activity: Advance as tolerated. Pelvic rest for 6 weeks.  Also refer to Discharge Instructions Diet: Regular Medications:   Medication List    TAKE these medications        docusate sodium 100 MG capsule  Commonly known as:  COLACE   Take 1 capsule (100 mg total) by mouth 2 (two) times daily as needed for mild constipation.     famotidine 20 MG tablet  Commonly known as:  PEPCID  Take 1 tablet (20 mg total) by mouth 2 (two) times daily.     ibuprofen 600 MG tablet  Commonly known as:  ADVIL,MOTRIN  Take 1 tablet (600 mg total) by mouth every 6 (six) hours as needed for mild pain or cramping.     ondansetron 4 MG disintegrating tablet  Commonly known as:  ZOFRAN-ODT  Take 4 mg by mouth every 8 (eight) hours as needed for nausea or vomiting.     prenatal multivitamin Tabs tablet  Take 1 tablet by mouth daily.       Outpatient follow up:  Follow-up Information    Follow up with GUTIERREZ, COLLEEN, CNM. Schedule an appointment as soon as possible for a visit in 1 week.   Specialty:  Certified Nurse Midwife   Why:  nexplanon placement and BP check   Contact information:   [address] [place] [address] [phone]      Postpartum contraception: Nexplanon  Discharged Condition: good  Discharged to: home   Newborn Data: Disposition:home with mother  Apgars: APGAR (1 MIN): 6   APGAR (5 MINS): 9   APGAR (10 MINS):    Baby Feeding: Loney Laurence, MD 05/23/2015 11:35 AM

## 2015-05-23 NOTE — Progress Notes (Signed)
Daily Post Partum Note  Erika Solomon is a 19 y.o. [id number] PPD#1 s/p  SVD/IP.  Pregnancy c/b IOL for pre-x w/o severe features, anx/depression, GERD, CI pregnancy  24hr/overnight events:  none  Subjective:  Pt doing well; meeting all PP goals. No s/s of pre-x  Objective:   Filed Vitals:   05/22/15 1639 05/22/15 1901 05/23/15 0018 05/23/15 0419  BP: 123/77 121/74 144/81 115/74  Pulse: 94  69 75  Temp: 98.2 F (36.8 C) 97.9 F (36.6 C) 97.9 F (36.6 C) 97.7 F (36.5 C)  TempSrc: Oral Oral Oral Oral  Resp:  20 18 18   Height:      Weight:      SpO2: 99%  99%      Current Vital Signs 24h Vital Sign Ranges  T 97.7 F (36.5 C) Temp  Avg: 98 F (36.7 C)  Min: 97.7 F (36.5 C)  Max: 98.2 F (36.8 C)  BP 115/74 mmHg BP  Min: 115/74  Max: 150/116  HR 75 Pulse  Avg: 84.6  Min: 69  Max: 122  RR 18 Resp  Avg: 18.7  Min: 18  Max: 20  SaO2 99 % Not Delivered SpO2  Avg: 99.3 %  Min: 99 %  Max: 100 %       24 Hour I/O Current Shift I/O  Time Ins Outs 08/18 0701 - 08/19 0700 In: 2030 [I.V.:2030] Out: 400       General: NAD Abdomen: FF below the umbilicus, nttp, nd Perineum: deferred Skin:  Warm and dry.  Cardiovascular:Regular rate and rhythm. Respiratory:  Clear to auscultation bilateral. Normal respiratory effort  Medications Current Facility-Administered Medications  Medication Dose Route Frequency Provider Last Rate Last Dose  . acetaminophen (TYLENOL) tablet 650 mg  650 mg Oral Q4H PRN Marta Antu, CNM      . benzocaine-Menthol (DERMOPLAST) 20-0.5 % topical spray 1 application  1 application Topical PRN Marta Antu, CNM      . witch hazel-glycerin (TUCKS) pad 1 application  1 application Topical PRN Marta Antu, CNM       And  . dibucaine (NUPERCAINAL) 1 % rectal ointment 1 application  1 application Rectal PRN Marta Antu, CNM      . docusate sodium (COLACE) capsule 100 mg  100 mg Oral BID Marta Antu, CNM   100 mg at 05/23/15 0026  . ferrous  sulfate tablet 325 mg  325 mg Oral Q breakfast Marta Antu, CNM      . ibuprofen (ADVIL,MOTRIN) tablet 600 mg  600 mg Oral 4 times per day Marta Antu, CNM   600 mg at 05/23/15 4540  . lanolin ointment   Topical PRN Marta Antu, CNM      . ondansetron (ZOFRAN) tablet 4 mg  4 mg Oral Q4H PRN Marta Antu, CNM      . oxyCODONE-acetaminophen (PERCOCET/ROXICET) 5-325 MG per tablet 1-2 tablet  1-2 tablet Oral Q4H PRN Marta Antu, CNM      . prenatal multivitamin tablet 1 tablet  1 tablet Oral Q1200 Marta Antu, CNM   1 tablet at 05/22/15 1812  . simethicone (MYLICON) chewable tablet 80 mg  80 mg Oral PRN Marta Antu, CNM      . zolpidem (AMBIEN) tablet 5 mg  5 mg Oral QHS PRN Marta Antu, CNM         Recent Labs Lab 05/21/15 1430 05/23/15 0429  WBC 7.9  --   HGB 9.7* 9.2*  HCT 29.7* 28.1*  PLT 147*  --  Recent Labs Lab 05/21/15 1430  NA 133*  K 3.5  CL 105  CO2 22  BUN 11  CREATININE 0.62  GLUCOSE 109*  CALCIUM 8.3*    Assessment & Plan:  Pt doing well *Postpartum/postop: routine care *pre-x: pt told to let us know if any s/s. BPs normal *Psych: no needs. Can see back one week PP and for nexplanon placement *Dispo: likely tomorrow  A POS / Rubella Immune / Varicella Immune/  RPR negative / HIV negative / HepBsAg negative / Tdap UTD: Yes/pap not applicable / Bottle  / Contraception: nexplanon / Follow up: Johnsie Kindred. MD [facility] Pager [phone]

## 2015-05-23 NOTE — Discharge Instructions (Signed)
Vaginal Delivery, Care After Refer to this sheet in the next few weeks. These discharge instructions provide you with information on caring for yourself after delivery. Your caregiver may also give you specific instructions. Your treatment has been planned according to the most current medical practices available, but problems sometimes occur. Call your caregiver if you have any problems or questions after you go home. HOME CARE INSTRUCTIONS  Take over-the-counter or prescription medicines only as directed by your caregiver or pharmacist.  Do not drink alcohol, especially if you are breastfeeding or taking medicine to relieve pain.  Do not smoke tobacco.  Continue to use good perineal care. Good perineal care includes:  Wiping your perineum from back to front  Keeping your perineum clean.  You can do sitz baths twice a day, to help keep this area clean  Do not use tampons, douche or have sex until your caregiver says it is okay.  Shower only and avoid sitting in submerged water, aside from sitz baths  Wear a well-fitting bra that provides breast support.  Eat healthy foods.  Drink enough fluids to keep your urine clear or pale yellow.  Eat high-fiber foods such as whole grain cereals and breads, brown rice, beans, and fresh fruits and vegetables every day. These foods may help prevent or relieve constipation.  Avoid constipation with high fiber foods or medications, such as miralax or metamucil  Follow your caregiver's recommendations regarding resumption of activities such as climbing stairs, driving, lifting, exercising, or traveling.  Talk to your caregiver about resuming sexual activities. Resumption of sexual activities is dependent upon your risk of infection, your rate of healing, and your comfort and desire to resume sexual activity.  Try to have someone help you with your household activities and your newborn for at least a few days after you leave the  hospital.  Rest as much as possible. Try to rest or take a nap when your newborn is sleeping.  Increase your activities gradually.  Keep all of your scheduled postpartum appointments. It is very important to keep your scheduled follow-up appointments. At these appointments, your caregiver will be checking to make sure that you are healing physically and emotionally. SEEK MEDICAL CARE IF:   You are passing large clots from your vagina. Save any clots to show your caregiver.  You have a foul smelling discharge from your vagina.  You have trouble urinating.  You are urinating frequently.  You have pain when you urinate.  You have a change in your bowel movements.  You have increasing redness, pain, or swelling near your vaginal incision (episiotomy) or vaginal tear.  You have pus draining from your episiotomy or vaginal tear.  Your episiotomy or vaginal tear is separating.  You have painful, hard, or reddened breasts.  You have a severe headache.  You have blurred vision or see spots.  You feel sad or depressed.  You have thoughts of hurting yourself or your newborn.  You have questions about your care, the care of your newborn, or medicines.  You are dizzy or light-headed.  You have a rash.  You have nausea or vomiting.  You were breastfeeding and have not had a menstrual period within 12 weeks after you stopped breastfeeding.  You are not breastfeeding and have not had a menstrual period by the 12th week after delivery.  You have a fever. SEEK IMMEDIATE MEDICAL CARE IF:   You have persistent pain.  You have chest pain.  You have shortness of breath.  You faint.  You have leg pain.  You have stomach pain.  Your vaginal bleeding saturates two or more sanitary pads in 1 hour. MAKE SURE YOU:   Understand these instructions.  Will watch your condition.  Will get help right away if you are not doing well or get worse. Document Released: 09/17/2000  Document Revised: 02/04/2014 Document Reviewed: 05/17/2012 [facility] Patient Information 2015 [place], [place]. This information is not intended to replace advice given to you by your health care provider. Make sure you discuss any questions you have with your health care provider.  Sitz Bath A sitz bath is a warm water bath taken in the sitting position. The water covers only the hips and butt (buttocks). We recommend using one that fits in the toilet, to help with ease of use and cleanliness. It may be used for either healing or cleaning purposes. Sitz baths are also used to relieve pain, itching, or muscle tightening (spasms). The water may contain medicine. Moist heat will help you heal and relax.  HOME CARE  Take 3 to 4 sitz baths a day.  Fill the bathtub half-full with warm water.  Sit in the water and open the drain a little.  Turn on the warm water to keep the tub half-full. Keep the water running constantly.  Soak in the water for 15 to 20 minutes.  After the sitz bath, pat the affected area dry. GET HELP RIGHT AWAY IF: You get worse instead of better. Stop the sitz baths if you get worse. MAKE SURE YOU:  Understand these instructions.  Will watch your condition.  Will get help right away if you are not doing well or get worse. Document Released: 10/28/2004 Document Revised: 06/14/2012 Document Reviewed: 01/18/2011 [facility] Patient Information 2015 [place], [place]. This information is not intended to replace advice given to you by your health care provider. Make sure you discuss any questions you have with your health care provider.   Preeclampsia and Eclampsia Preeclampsia is a serious condition that develops only during pregnancy. It is also called toxemia of pregnancy. This condition causes high blood pressure along with other symptoms, such as swelling and headaches. These may develop as the condition gets worse. Preeclampsia may occur 20 weeks or later into your pregnancy.   Diagnosing and treating preeclampsia early is very important. If not treated early, it can cause serious problems for you and your baby. One problem it can lead to is eclampsia, which is a condition that causes muscle jerking or shaking (convulsions) in the mother. Delivering your baby is the best treatment for preeclampsia or eclampsia.  RISK FACTORS The cause of preeclampsia is not known. You may be more likely to develop preeclampsia if you have certain risk factors. These include:   Being pregnant for the first time.  Having preeclampsia in a past pregnancy.  Having a family history of preeclampsia.  Having high blood pressure.  Being pregnant with twins or triplets.  Being 69 or older.  Being African American.  Having kidney disease or diabetes.  Having medical conditions such as lupus or blood diseases.  Being very overweight (obese). SIGNS AND SYMPTOMS  The earliest signs of preeclampsia are:  High blood pressure.  Increased protein in your urine. Your health care provider will check for this at every prenatal visit. Other symptoms that can develop include:   Severe headaches.  Sudden weight gain.  Swelling of your hands, face, legs, and feet.  Feeling sick to your stomach (nauseous) and throwing up (  vomiting).  Vision problems (blurred or double vision).  Numbness in your face, arms, legs, and feet.  Dizziness.  Slurred speech.  Sensitivity to bright lights.  Abdominal pain. SEEK MEDICAL CARE IF:  You are gaining more weight than expected.  You have any headaches, abdominal pain, or nausea.  You are bruising more than usual.  You feel dizzy or light-headed. SEEK IMMEDIATE MEDICAL CARE IF:   You develop sudden or severe swelling anywhere in your body. This usually happens in the legs.  You gain 5 lb (2.3 kg) or more in a week.  You have a severe headache, dizziness, problems with your vision, or confusion.  You have severe abdominal  pain.  You have lasting nausea or vomiting.  You have a seizure.  You have trouble moving any part of your body.  You develop numbness in your body.  You have trouble speaking.  You have any abnormal bleeding.  You develop a stiff neck.  You pass out. MAKE SURE YOU:   Understand these instructions.  Will watch your condition.  Will get help right away if you are not doing well or get worse. Document Released: 09/17/2000 Document Revised: 09/25/2013 Document Reviewed: 07/13/2013 [facility] Patient Information 2015 [place], [place]. This information is not intended to replace advice given to you by your health care provider. Make sure you discuss any questions you have with your health care provider.

## 2015-07-20 ENCOUNTER — Encounter: Payer: Self-pay | Admitting: Emergency Medicine

## 2015-07-20 ENCOUNTER — Emergency Department
Admission: EM | Admit: 2015-07-20 | Discharge: 2015-07-20 | Disposition: A | Discharge status: Home / Self Care | Payer: Medicaid Other | Attending: Emergency Medicine | Admitting: Emergency Medicine

## 2015-07-20 DIAGNOSIS — F419 Anxiety disorder, unspecified: Secondary | ICD-10-CM | POA: Diagnosis not present

## 2015-07-20 DIAGNOSIS — S8011XA Contusion of right lower leg, initial encounter: Secondary | ICD-10-CM | POA: Diagnosis not present

## 2015-07-20 DIAGNOSIS — S3991XA Unspecified injury of abdomen, initial encounter: Secondary | ICD-10-CM | POA: Insufficient documentation

## 2015-07-20 DIAGNOSIS — S0081XA Abrasion of other part of head, initial encounter: Secondary | ICD-10-CM | POA: Diagnosis not present

## 2015-07-20 DIAGNOSIS — Y998 Other external cause status: Secondary | ICD-10-CM | POA: Insufficient documentation

## 2015-07-20 DIAGNOSIS — Y9389 Activity, other specified: Secondary | ICD-10-CM | POA: Diagnosis not present

## 2015-07-20 DIAGNOSIS — F329 Major depressive disorder, single episode, unspecified: Secondary | ICD-10-CM | POA: Diagnosis not present

## 2015-07-20 DIAGNOSIS — Z79899 Other long term (current) drug therapy: Secondary | ICD-10-CM | POA: Diagnosis not present

## 2015-07-20 DIAGNOSIS — S6991XA Unspecified injury of right wrist, hand and finger(s), initial encounter: Secondary | ICD-10-CM | POA: Insufficient documentation

## 2015-07-20 DIAGNOSIS — S60221A Contusion of right hand, initial encounter: Secondary | ICD-10-CM | POA: Insufficient documentation

## 2015-07-20 DIAGNOSIS — S0990XA Unspecified injury of head, initial encounter: Secondary | ICD-10-CM | POA: Diagnosis present

## 2015-07-20 DIAGNOSIS — Y92009 Unspecified place in unspecified non-institutional (private) residence as the place of occurrence of the external cause: Secondary | ICD-10-CM | POA: Insufficient documentation

## 2015-07-20 DIAGNOSIS — S1091XA Abrasion of unspecified part of neck, initial encounter: Secondary | ICD-10-CM | POA: Insufficient documentation

## 2015-07-20 DIAGNOSIS — Z87891 Personal history of nicotine dependence: Secondary | ICD-10-CM | POA: Insufficient documentation

## 2015-07-20 MED ORDER — IBUPROFEN 800 MG PO TABS
800.0000 mg | ORAL_TABLET | Freq: Once | ORAL | Status: AC
  Administered 2015-07-20: 800 mg via ORAL
  Filled 2015-07-20: qty 1

## 2015-07-20 MED ORDER — KETOROLAC TROMETHAMINE 60 MG/2ML IM SOLN
60.0000 mg | Freq: Once | INTRAMUSCULAR | Status: AC
  Administered 2015-07-20: 60 mg via INTRAMUSCULAR
  Filled 2015-07-20: qty 2

## 2015-07-20 MED ORDER — CYCLOBENZAPRINE HCL 10 MG PO TABS: 10.0000 mg | ORAL_TABLET | Freq: Three times a day (TID) | ORAL | Status: DC | PRN

## 2015-07-20 MED ORDER — IBUPROFEN 800 MG PO TABS: 800.0000 mg | ORAL_TABLET | Freq: Three times a day (TID) | ORAL | Status: DC | PRN

## 2015-07-20 MED ORDER — CYCLOBENZAPRINE HCL 10 MG PO TABS
10.0000 mg | ORAL_TABLET | Freq: Once | ORAL | Status: AC
Start: 2015-07-20 — End: 2015-07-20
  Administered 2015-07-20: 10 mg via ORAL
  Filled 2015-07-20: qty 1

## 2015-07-20 MED ORDER — TRAMADOL HCL 50 MG PO TABS: 50.0000 mg | ORAL_TABLET | Freq: Four times a day (QID) | ORAL | Status: DC | PRN
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
## 2015-10-02 ENCOUNTER — Emergency Department
Admission: EM | Admit: 2015-10-02 | Discharge: 2015-10-02 | Disposition: A | Discharge status: Home / Self Care | Payer: Medicaid Other | Attending: Emergency Medicine | Admitting: Emergency Medicine

## 2015-10-02 DIAGNOSIS — Z79899 Other long term (current) drug therapy: Secondary | ICD-10-CM | POA: Insufficient documentation

## 2015-10-02 DIAGNOSIS — Z87891 Personal history of nicotine dependence: Secondary | ICD-10-CM | POA: Insufficient documentation

## 2015-10-02 DIAGNOSIS — J069 Acute upper respiratory infection, unspecified: Secondary | ICD-10-CM | POA: Insufficient documentation

## 2015-10-02 MED ORDER — PREDNISONE 10 MG (21) PO TBPK: ORAL_TABLET | ORAL | Status: DC

## 2015-10-02 NOTE — ED Notes (Signed)
Pt c/o sinus congestion for the past 2 weeks with some dizziness.

## 2015-10-02 NOTE — ED Provider Notes (Signed)
[facility] Emergency Department Provider Note  ____________________________________________  Time seen: Approximately 11:08 AM  I have reviewed the triage vital signs and the nursing notes.   HISTORY  Chief Complaint Nasal Congestion   HPI Erika Solomon is a 19 y.o. female who presents to the emergency department for evaluation of sinus congestion for the past week. She states that she just had gotten over a cold and then symptoms returned after her children got sick. No relief with OTC cold meds. One of the medications makes her feel "dizzy" but she is unsure which one. She fell last and scratched her scalp. No loss of consciousness.    Past Medical History  Diagnosis Date  . Anxiety and depression     Patient Active Problem List   Diagnosis Date Noted  . Pregnancy 05/21/2015  . Indication for care in labor or delivery 05/21/2015  . Mild preeclampsia 05/21/2015  . Labor and delivery indication for care or intervention 04/24/2015  . Variable fetal heart rate decelerations, antepartum 04/23/2015  . Fetal heart rate decelerations affecting management of mother 04/23/2015  . Nausea/vomiting in pregnancy 02/13/2015  . Pregnant and not yet delivered 02/12/2015  . Nausea and vomiting in pregnancy 02/12/2015    Past Surgical History  Procedure Laterality Date  . No past surgery      Current Outpatient Rx  Name  Route  Sig  Dispense  Refill  . cyclobenzaprine (FLEXERIL) 10 MG tablet   Oral   Take 1 tablet (10 mg total) by mouth every 8 (eight) hours as needed for muscle spasms.   15 tablet   0   . docusate sodium (COLACE) 100 MG capsule   Oral   Take 1 capsule (100 mg total) by mouth 2 (two) times daily as needed for mild constipation.   45 capsule   1   . famotidine (PEPCID) 20 MG tablet   Oral   Take 1 tablet (20 mg total) by mouth 2 (two) times daily.   60 tablet   4   . ibuprofen (ADVIL,MOTRIN) 600 MG tablet   Oral   Take 1 tablet  (600 mg total) by mouth every 6 (six) hours as needed for mild pain or cramping.   30 tablet   1   . ibuprofen (ADVIL,MOTRIN) 800 MG tablet   Oral   Take 1 tablet (800 mg total) by mouth every 8 (eight) hours as needed for moderate pain.   15 tablet   0   . ondansetron (ZOFRAN-ODT) 4 MG disintegrating tablet   Oral   Take 4 mg by mouth every 8 (eight) hours as needed for nausea or vomiting.         . predniSONE (STERAPRED UNI-PAK 21 TAB) 10 MG (21) TBPK tablet      Take 6 tablets on day 1 Take 5 tablets on day 2 Take 4 tablets on day 3 Take 3 tablets on day 4 Take 2 tablets on day 5 Take 1 tablet on day 6   21 tablet   0   . Prenatal Vit-Fe Fumarate-FA (PRENATAL MULTIVITAMIN) TABS tablet   Oral   Take 1 tablet by mouth daily.          . traMADol (ULTRAM) 50 MG tablet   Oral   Take 1 tablet (50 mg total) by mouth every 6 (six) hours as needed for moderate pain.   12 tablet   0     Allergies Albuterol; Benadryl; Buspar; and Strawberry extract  Family History  Problem Relation Age of Onset  . Diabetes Maternal Grandfather   . Hypertension Father   . Hypertension Maternal Grandmother     Social History Social History  Substance Use Topics  . Smoking status: Former Games developer  . Smokeless tobacco: None  . Alcohol Use: 0.0 oz/week    0 Standard drinks or equivalent per week     Comment: occasional    Review of Systems Constitutional: No fever/chills Eyes: No visual changes. ENT: No sore throat. Cardiovascular: Denies chest pain. Respiratory: Negative for shortness of breath. Positive for cough. Gastrointestinal: Negative for abdominal pain. No nausea,  No vomiting.  No diarrhea.  Genitourinary: Negative for dysuria. Musculoskeletal: Negative for body aches Skin: Negative for rash. Neurological: Positive for headaches, Negative for focal weakness or numbness.  10-point ROS otherwise negative.  ____________________________________________   PHYSICAL  EXAM:  VITAL SIGNS: ED Triage Vitals  Enc Vitals Group     BP 10/02/15 1024 115/67 mmHg     Pulse Rate 10/02/15 1024 80     Resp 10/02/15 1024 18     Temp 10/02/15 1024 98.3 F (36.8 C)     Temp Source 10/02/15 1024 Oral     SpO2 10/02/15 1024 99 %     Weight 10/02/15 1024 160 lb (72.576 kg)     Height 10/02/15 1024  (1.626 m)     Head Cir --      Peak Flow --      Pain Score 10/02/15 1030 7     Pain Loc --      Pain Edu? --      Excl. in GC? --     Constitutional: Alert and oriented. Well appearing and in no acute distress. Eyes: Conjunctivae are normal. PERRL. EOMI. Ears: Serous OM bilaterally. Head: Atraumatic. Nose: No congestion/rhinnorhea. Mouth/Throat: Mucous membranes are moist.  Oropharynx non-erythematous. Neck: No stridor.  Lymphatic: No cervical lymphadenopathy. Cardiovascular: Normal rate, regular rhythm. Grossly normal heart sounds.  Good peripheral circulation. Respiratory: Normal respiratory effort.  No retractions. Lungs Clear to auscultation bilaterally. Gastrointestinal: Soft and nontender. No distention. No abdominal bruits. No CVA tenderness. Musculoskeletal: No joint pain reported. Neurologic:  Normal speech and language. No gross focal neurologic deficits are appreciated. Speech is normal. No gait instability. Skin:  Skin is warm, dry and intact. No rash noted. Psychiatric: Mood and affect are normal. Speech and behavior are normal.  ____________________________________________   LABS (all labs ordered are listed, but only abnormal results are displayed)  Labs Reviewed - No data to display ____________________________________________  EKG   ____________________________________________  RADIOLOGY   ____________________________________________   PROCEDURES  Procedure(s) performed: None  Critical Care performed: No  ____________________________________________   INITIAL IMPRESSION / ASSESSMENT AND PLAN / ED COURSE  Pertinent  labs & imaging results that were available during my care of the patient were reviewed by me and considered in my medical decision making (see chart for details).  Patient was advised to follow up with PCP of choice for symptoms that are not improving with the Prednisone pack.  ____________________________________________   FINAL CLINICAL IMPRESSION(S) / ED DIAGNOSES  Final diagnoses:  Acute upper respiratory infection       Chinita Pester, FNP 10/02/15 1116  Arnaldo Natal, MD 10/02/15 [phone]

## 2015-10-02 NOTE — ED Notes (Signed)
States she has had nasal and chest congestion with occasional productive cough for about 1 week   Also fell and hit her head last pm  No loc

## 2015-10-02 NOTE — Discharge Instructions (Signed)
Upper Respiratory Infection, Adult Most upper respiratory infections (URIs) are a viral infection of the air passages leading to the lungs. A URI affects the nose, throat, and upper air passages. The most common type of URI is nasopharyngitis and is typically referred to as "the common cold." URIs run their course and usually go away on their own. Most of the time, a URI does not require medical attention, but sometimes a bacterial infection in the upper airways can follow a viral infection. This is called a secondary infection. Sinus and middle ear infections are common types of secondary upper respiratory infections. Bacterial pneumonia can also complicate a URI. A URI can worsen asthma and chronic obstructive pulmonary disease (COPD). Sometimes, these complications can require emergency medical care and may be life threatening.  CAUSES Almost all URIs are caused by viruses. A virus is a type of germ and can spread from one person to another.  RISKS FACTORS You may be at risk for a URI if:   You smoke.   You have chronic heart or lung disease.  You have a weakened defense (immune) system.   You are very young or very old.   You have nasal allergies or asthma.  You work in crowded or poorly ventilated areas.  You work in health care facilities or schools. SIGNS AND SYMPTOMS  Symptoms typically develop 2-3 days after you come in contact with a cold virus. Most viral URIs last 7-10 days. However, viral URIs from the influenza virus (flu virus) can last 14-18 days and are typically more severe. Symptoms may include:   Runny or stuffy (congested) nose.   Sneezing.   Cough.   Sore throat.   Headache.   Fatigue.   Fever.   Loss of appetite.   Pain in your forehead, behind your eyes, and over your cheekbones (sinus pain).  Muscle aches.  DIAGNOSIS  Your health care provider may diagnose a URI by:  Physical exam.  Tests to check that your symptoms are not due to  another condition such as:  Strep throat.  Sinusitis.  Pneumonia.  Asthma. TREATMENT  A URI goes away on its own with time. It cannot be cured with medicines, but medicines may be prescribed or recommended to relieve symptoms. Medicines may help:  Reduce your fever.  Reduce your cough.  Relieve nasal congestion. HOME CARE INSTRUCTIONS   Take medicines only as directed by your health care provider.   Gargle warm saltwater or take cough drops to comfort your throat as directed by your health care provider.  Use a warm mist humidifier or inhale steam from a shower to increase air moisture. This may make it easier to breathe.  Drink enough fluid to keep your urine clear or pale yellow.   Eat soups and other clear broths and maintain good nutrition.   Rest as needed.   Return to work when your temperature has returned to normal or as your health care provider advises. You may need to stay home longer to avoid infecting others. You can also use a face mask and careful hand washing to prevent spread of the virus.  Increase the usage of your inhaler if you have asthma.   Do not use any tobacco products, including cigarettes, chewing tobacco, or electronic cigarettes. If you need help quitting, ask your health care provider. PREVENTION  The best way to protect yourself from getting a cold is to practice good hygiene.   Avoid oral or hand contact with people with cold   symptoms.   Wash your hands often if contact occurs.  There is no clear evidence that vitamin C, vitamin E, echinacea, or exercise reduces the chance of developing a cold. However, it is always recommended to get plenty of rest, exercise, and practice good nutrition.  SEEK MEDICAL CARE IF:   You are getting worse rather than better.   Your symptoms are not controlled by medicine.   You have chills.  You have worsening shortness of breath.  You have brown or red mucus.  You have yellow or brown nasal  discharge.  You have pain in your face, especially when you bend forward.  You have a fever.  You have swollen neck glands.  You have pain while swallowing.  You have white areas in the back of your throat. SEEK IMMEDIATE MEDICAL CARE IF:   You have severe or persistent:  Headache.  Ear pain.  Sinus pain.  Chest pain.  You have chronic lung disease and any of the following:  Wheezing.  Prolonged cough.  Coughing up blood.  A change in your usual mucus.  You have a stiff neck.  You have changes in your:  Vision.  Hearing.  Thinking.  Mood. MAKE SURE YOU:   Understand these instructions.  Will watch your condition.  Will get help right away if you are not doing well or get worse.   This information is not intended to replace advice given to you by your health care provider. Make sure you discuss any questions you have with your health care provider.   Document Released: 03/16/2001 Document Revised: 02/04/2015 Document Reviewed: 12/26/2013 Elsevier Interactive Patient Education 2016 [employer]vier Inc[employer]  

## 2015-10-30 ENCOUNTER — Encounter: Payer: Self-pay | Admitting: Emergency Medicine

## 2015-10-30 ENCOUNTER — Emergency Department
Admission: EM | Admit: 2015-10-30 | Discharge: 2015-10-30 | Disposition: A | Discharge status: Home / Self Care | Payer: Medicaid Other | Attending: Emergency Medicine | Admitting: Emergency Medicine

## 2015-10-30 ENCOUNTER — Emergency Department: Payer: Medicaid Other

## 2015-10-30 DIAGNOSIS — S20219A Contusion of unspecified front wall of thorax, initial encounter: Secondary | ICD-10-CM | POA: Diagnosis not present

## 2015-10-30 DIAGNOSIS — Z3A00 Weeks of gestation of pregnancy not specified: Secondary | ICD-10-CM | POA: Insufficient documentation

## 2015-10-30 DIAGNOSIS — Y9289 Other specified places as the place of occurrence of the external cause: Secondary | ICD-10-CM | POA: Diagnosis not present

## 2015-10-30 DIAGNOSIS — Z79899 Other long term (current) drug therapy: Secondary | ICD-10-CM | POA: Diagnosis not present

## 2015-10-30 DIAGNOSIS — Z87891 Personal history of nicotine dependence: Secondary | ICD-10-CM | POA: Diagnosis not present

## 2015-10-30 DIAGNOSIS — Y9389 Activity, other specified: Secondary | ICD-10-CM | POA: Diagnosis not present

## 2015-10-30 DIAGNOSIS — S40211A Abrasion of right shoulder, initial encounter: Secondary | ICD-10-CM | POA: Diagnosis not present

## 2015-10-30 DIAGNOSIS — Y998 Other external cause status: Secondary | ICD-10-CM | POA: Insufficient documentation

## 2015-10-30 DIAGNOSIS — S29001A Unspecified injury of muscle and tendon of front wall of thorax, initial encounter: Secondary | ICD-10-CM | POA: Diagnosis present

## 2015-10-30 MED ORDER — IBUPROFEN 600 MG PO TABS
600.0000 mg | ORAL_TABLET | Freq: Once | ORAL | Status: AC
  Administered 2015-10-30: 600 mg via ORAL
  Filled 2015-10-30: qty 1

## 2015-10-30 MED ORDER — IBUPROFEN 600 MG PO TABS: 600.0000 mg | ORAL_TABLET | Freq: Four times a day (QID) | ORAL | Status: DC | PRN
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
## 2015-10-30 NOTE — Discharge Instructions (Signed)
Chest Contusion A contusion is a deep bruise. Bruises happen when an injury causes bleeding under the skin. Signs of bruising include pain, puffiness (swelling), and discolored skin. The bruise may turn blue, purple, or yellow.  HOME CARE  Put ice on the injured area.  Put ice in a plastic bag.  Place a towel between the skin and the bag.  Leave the ice on for 15-20 minutes at a time, 03-04 times a day for the first 48 hours.  Only take medicine as told by your doctor.  Rest.  Take deep breaths (deep-breathing exercises) as told by your doctor.  Stop smoking if you smoke.  Do not lift objects over 5 pounds (2.3 kilograms) for 3 days or longer if told by your doctor. GET HELP RIGHT AWAY IF:   You have more bruising or puffiness.  You have pain that gets worse.  You have trouble breathing.  You are dizzy, weak, or pass out (faint).  You have blood in your pee (urine) or poop (stool).  You cough up or throw up (vomit) blood.  Your puffiness or pain is not helped with medicines. MAKE SURE YOU:   Understand these instructions.  Will watch your condition.  Will get help right away if you are not doing well or get worse.   This information is not intended to replace advice given to you by your health care provider. Make sure you discuss any questions you have with your health care provider.   Document Released: 03/08/2008 Document Revised: 06/14/2012 Document Reviewed: 03/13/2012 Elsevier Interactive Patient Education 2016 [employer].   Follow-up with [facility] if any continued problems. Use ice to the ribs as needed for discomfort. Begin taking ibuprofen 600 mg with food for inflammation and pain.
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
## 2015-12-15 ENCOUNTER — Encounter: Payer: Self-pay | Admitting: Emergency Medicine

## 2015-12-15 ENCOUNTER — Emergency Department
Admission: EM | Admit: 2015-12-15 | Discharge: 2015-12-15 | Disposition: A | Discharge status: Home / Self Care | Payer: Medicaid Other | Attending: Emergency Medicine | Admitting: Emergency Medicine

## 2015-12-15 DIAGNOSIS — J101 Influenza due to other identified influenza virus with other respiratory manifestations: Secondary | ICD-10-CM

## 2015-12-15 DIAGNOSIS — Z79899 Other long term (current) drug therapy: Secondary | ICD-10-CM | POA: Insufficient documentation

## 2015-12-15 DIAGNOSIS — R52 Pain, unspecified: Secondary | ICD-10-CM | POA: Diagnosis present

## 2015-12-15 DIAGNOSIS — Z87891 Personal history of nicotine dependence: Secondary | ICD-10-CM | POA: Insufficient documentation

## 2015-12-15 LAB — RAPID INFLUENZA A&B ANTIGENS
Influenza A (ARMC): NEGATIVE
Influenza B (ARMC): POSITIVE — AB

## 2015-12-15 LAB — POCT RAPID STREP A: Streptococcus, Group A Screen (Direct): NEGATIVE

## 2015-12-15 MED ORDER — OSELTAMIVIR PHOSPHATE 75 MG PO CAPS: 75.0000 mg | ORAL_CAPSULE | Freq: Two times a day (BID) | ORAL | Status: DC

## 2015-12-15 MED ORDER — PROMETHAZINE-DM 6.25-15 MG/5ML PO SYRP: 5.0000 mL | ORAL_SOLUTION | Freq: Four times a day (QID) | ORAL | Status: DC | PRN

## 2015-12-15 NOTE — ED Provider Notes (Signed)
[facility] Emergency Department Provider Note  ____________________________________________  Time seen: Approximately 1:10 PM  I have reviewed the triage vital signs and the nursing notes.   HISTORY  Chief Complaint Generalized Body Aches    HPI Erika Solomon is a 20 y.o. female , NAD, presents to the emergency department with one-day history of sore throat, body aches, chills. Has not checked her temperature so unknown if she had a fever. Has also had some nasal congestion and runny nose. Notes everyone in her household is been sick with some upper respiratory illness over the last few days. Denies any abdominal pain, nausea, vomiting, diarrhea. Chest and back pain. Has not taken anything over-the-counter at this time for her current symptoms.   Past Medical History  Diagnosis Date  . Anxiety and depression     Patient Active Problem List   Diagnosis Date Noted  . Pregnancy 05/21/2015  . Indication for care in labor or delivery 05/21/2015  . Mild preeclampsia 05/21/2015  . Labor and delivery indication for care or intervention 04/24/2015  . Variable fetal heart rate decelerations, antepartum 04/23/2015  . Fetal heart rate decelerations affecting management of mother 04/23/2015  . Nausea/vomiting in pregnancy 02/13/2015  . Pregnant and not yet delivered 02/12/2015  . Nausea and vomiting in pregnancy 02/12/2015    Past Surgical History  Procedure Laterality Date  . No past surgery      Current Outpatient Rx  Name  Route  Sig  Dispense  Refill  . cyclobenzaprine (FLEXERIL) 10 MG tablet   Oral   Take 1 tablet (10 mg total) by mouth every 8 (eight) hours as needed for muscle spasms.   15 tablet   0   . docusate sodium (COLACE) 100 MG capsule   Oral   Take 1 capsule (100 mg total) by mouth 2 (two) times daily as needed for mild constipation.   45 capsule   1   . famotidine (PEPCID) 20 MG tablet   Oral   Take 1 tablet (20 mg total) by  mouth 2 (two) times daily.   60 tablet   4   . ibuprofen (ADVIL,MOTRIN) 600 MG tablet   Oral   Take 1 tablet (600 mg total) by mouth every 6 (six) hours as needed.   30 tablet   0   . ondansetron (ZOFRAN-ODT) 4 MG disintegrating tablet   Oral   Take 4 mg by mouth every 8 (eight) hours as needed for nausea or vomiting.         Marland Kitchen oseltamivir (TAMIFLU) 75 MG capsule   Oral   Take 1 capsule (75 mg total) by mouth 2 (two) times daily.   10 capsule   0   . Prenatal Vit-Fe Fumarate-FA (PRENATAL MULTIVITAMIN) TABS tablet   Oral   Take 1 tablet by mouth daily.          . promethazine-dextromethorphan (PROMETHAZINE-DM) 6.25-15 MG/5ML syrup   Oral   Take 5 mLs by mouth 4 (four) times daily as needed for cough.   118 mL   0     Allergies Albuterol; Benadryl; Buspar; and Strawberry extract  Family History  Problem Relation Age of Onset  . Diabetes Maternal Grandfather   . Hypertension Father   . Hypertension Maternal Grandmother     Social History Social History  Substance Use Topics  . Smoking status: Former Games developer  . Smokeless tobacco: None  . Alcohol Use: 0.0 oz/week    0 Standard drinks or equivalent  per week     Comment: occasional     Review of Systems  Constitutional: Eyes of chills. No fever. Eyes: No visual changes. No discharge, erythema, swelling. ENT: Positive nasal congestion, runny nose, sore throat. Ear pain, sinus pressure. Cardiovascular: No chest pain. Respiratory: As of cough. No shortness of breath. No wheezing.  Gastrointestinal: No abdominal pain.  No nausea, vomiting.  No diarrhea.   Musculoskeletal: Positive general myalgias.  Skin: Negative for rash. Neurological: Negative for headaches, focal weakness or numbness. 10-point ROS otherwise negative.  ____________________________________________   PHYSICAL EXAM:  VITAL SIGNS: ED Triage Vitals  Enc Vitals Group     BP 12/15/15 1213 122/75 mmHg     Pulse Rate 12/15/15 1213 106      Resp 12/15/15 1213 18     Temp 12/15/15 1213 98.6 F (37 C)     Temp Source 12/15/15 1213 Oral     SpO2 12/15/15 1213 98 %     Weight 12/15/15 1213 160 lb (72.576 kg)     Height 12/15/15 1213 5' 3.5" (1.613 m)     Head Cir --      Peak Flow --      Pain Score 12/15/15 1214 6     Pain Loc --      Pain Edu? --      Excl. in GC? --     Constitutional: Alert and oriented. Well appearing and in no acute distress. Eyes: Conjunctivae are normal. PERRL. EOMI without pain.  Head: Atraumatic. ENT:      Ears: TMs visualized bilaterally without erythema, effusion, bulging, perforation.      Nose: Moderate congestion/rhinnorhea.      Mouth/Throat: Mucous membranes are moist. Pharynx with mild injection but no overt erythema, swelling, exudates. Neck: Upper with full range of motion. Hematological/Lymphatic/Immunilogical: No cervical lymphadenopathy. Cardiovascular: Normal rate, regular rhythm. Normal S1 and S2.  Good peripheral circulation. Respiratory: Normal respiratory effort without tachypnea or retractions. Lungs CTAB. Neurologic:  Normal speech and language. No gross focal neurologic deficits are appreciated.  Skin:  Skin is warm, dry and intact. No rash noted. Psychiatric: Mood and affect are normal. Speech and behavior are normal. Patient exhibits appropriate insight and judgement.   ____________________________________________   LABS (all labs ordered are listed, but only abnormal results are displayed)  Labs Reviewed  RAPID INFLUENZA A&B ANTIGENS ([facility] ONLY)  POCT RAPID STREP A   ____________________________________________  EKG  None ____________________________________________  RADIOLOGY  None  ____________________________________________    PROCEDURES  Procedure(s) performed: None    Medications - No data to display   ____________________________________________   INITIAL IMPRESSION / ASSESSMENT AND PLAN / ED COURSE  Pertinent lab results that were  available during my care of the patient were reviewed by me and considered in my medical decision making (see chart for details).  Patient's diagnosis is consistent with influenza infection. Patient will be discharged home with prescriptions for Tamiflu and promethazine DM syrup to take as directed. May alternate Tylenol and ibuprofen as needed for fever and aches. Patient is to follow up with [facility] if symptoms persist past this treatment course. Patient is given ED precautions to return to the ED for any worsening or new symptoms.    ____________________________________________  FINAL CLINICAL IMPRESSION(S) / ED DIAGNOSES  Final diagnoses:  Influenza B      NEW MEDICATIONS STARTED DURING THIS VISIT:  New Prescriptions   OSELTAMIVIR (TAMIFLU) 75 MG CAPSULE    Take 1 capsule (75 mg total)  by mouth 2 (two) times daily.   PROMETHAZINE-DEXTROMETHORPHAN (PROMETHAZINE-DM) 6.25-15 MG/5ML SYRUP    Take 5 mLs by mouth 4 (four) times daily as needed for cough.         Hope PigeonJami L Dmetrius Ambs, PA-C 12/15/15 1413  Jene Everyobert Kinner, MD 12/15/15 1440

## 2015-12-15 NOTE — Discharge Instructions (Signed)

## 2015-12-15 NOTE — ED Notes (Signed)
Pt c/o sore throat, body aches, chills, runny nose, and sweats at home. Has not taken temperature. Reports whole family has been sick.  No respiratory distress.

## 2016-04-15 ENCOUNTER — Emergency Department
Admission: EM | Admit: 2016-04-15 | Discharge: 2016-04-15 | Disposition: A | Discharge status: Home / Self Care | Payer: Medicaid Other | Attending: Emergency Medicine | Admitting: Emergency Medicine

## 2016-04-15 ENCOUNTER — Encounter: Payer: Self-pay | Admitting: Emergency Medicine

## 2016-04-15 ENCOUNTER — Emergency Department: Payer: Medicaid Other

## 2016-04-15 DIAGNOSIS — F329 Major depressive disorder, single episode, unspecified: Secondary | ICD-10-CM | POA: Diagnosis not present

## 2016-04-15 DIAGNOSIS — R102 Pelvic and perineal pain: Secondary | ICD-10-CM | POA: Diagnosis present

## 2016-04-15 DIAGNOSIS — Z87891 Personal history of nicotine dependence: Secondary | ICD-10-CM | POA: Insufficient documentation

## 2016-04-15 DIAGNOSIS — Z3201 Encounter for pregnancy test, result positive: Secondary | ICD-10-CM | POA: Diagnosis not present

## 2016-04-15 DIAGNOSIS — Z3A01 Less than 8 weeks gestation of pregnancy: Secondary | ICD-10-CM | POA: Diagnosis not present

## 2016-04-15 DIAGNOSIS — Z3490 Encounter for supervision of normal pregnancy, unspecified, unspecified trimester: Secondary | ICD-10-CM

## 2016-04-15 LAB — CBC
HCT: 32.8 % — ABNORMAL LOW (ref 35.0–47.0)
Hemoglobin: 11 g/dL — ABNORMAL LOW (ref 12.0–16.0)
MCH: 26.3 pg (ref 26.0–34.0)
MCHC: 33.6 g/dL (ref 32.0–36.0)
MCV: 78.3 fL — ABNORMAL LOW (ref 80.0–100.0)
Platelets: 210 10*3/uL (ref 150–440)
RBC: 4.2 MIL/uL (ref 3.80–5.20)
RDW: 15.1 % — ABNORMAL HIGH (ref 11.5–14.5)
WBC: 9.9 10*3/uL (ref 3.6–11.0)

## 2016-04-15 LAB — COMPREHENSIVE METABOLIC PANEL
ALT: 18 U/L (ref 14–54)
AST: 23 U/L (ref 15–41)
Albumin: 4.2 g/dL (ref 3.5–5.0)
Alkaline Phosphatase: 51 U/L (ref 38–126)
Anion gap: 7 (ref 5–15)
BUN: 16 mg/dL (ref 6–20)
CO2: 23 mmol/L (ref 22–32)
Calcium: 8.8 mg/dL — ABNORMAL LOW (ref 8.9–10.3)
Chloride: 108 mmol/L (ref 101–111)
Creatinine, Ser: 0.83 mg/dL (ref 0.44–1.00)
GFR calc Af Amer: >60 mL/min (ref >60)
GFR calc non Af Amer: >60 mL/min (ref >60)
Glucose, Bld: 92 mg/dL (ref 65–99)
Potassium: 3.4 mmol/L — ABNORMAL LOW (ref 3.5–5.1)
Sodium: 138 mmol/L (ref 135–145)
Total Bilirubin: 0.3 mg/dL (ref 0.3–1.2)
Total Protein: 7.2 g/dL (ref 6.5–8.1)

## 2016-04-15 LAB — URINALYSIS COMPLETE WITH MICROSCOPIC (ARMC ONLY)
Bilirubin Urine: NEGATIVE
Glucose, UA: NEGATIVE mg/dL
Hgb urine dipstick: NEGATIVE
Ketones, ur: NEGATIVE mg/dL
Nitrite: NEGATIVE
Protein, ur: 30 mg/dL — AB
Specific Gravity, Urine: 1.021 (ref 1.005–1.030)
pH: 6 (ref 5.0–8.0)

## 2016-04-15 LAB — POCT PREGNANCY, URINE: Preg Test, Ur: NEGATIVE

## 2016-04-15 LAB — LIPASE, BLOOD: Lipase: 30 U/L (ref 11–51)

## 2016-04-15 LAB — POC URINE PREG, ED: Preg Test, Ur: POSITIVE — AB

## 2016-04-15 LAB — HCG, QUANTITATIVE, PREGNANCY: hCG, Beta Chain, Quant, S: 158 m[IU]/mL — ABNORMAL HIGH (ref <5)

## 2016-04-15 MED ORDER — NITROFURANTOIN MACROCRYSTAL 100 MG PO CAPS: 100.0000 mg | ORAL_CAPSULE | Freq: Four times a day (QID) | ORAL | Status: DC

## 2016-04-15 NOTE — ED Notes (Signed)
Pt presents to ED with reports of upper abdominal pain that radiates to epigastric area. Pt reports nausea and diarrhea but denies vomiting. Pt denies SOB.

## 2016-04-15 NOTE — ED Notes (Addendum)
Pregnancy, urine POC Resulted; NEGATIVE. IT help desk has been notified and they will be making the correction. Charge nurse Sue LushAndrea, RN has been notified as well.

## 2016-04-15 NOTE — Discharge Instructions (Signed)
First Trimester of Pregnancy The first trimester of pregnancy is from week 1 until the end of week 12 (months 1 through 3). A week after a sperm fertilizes an egg, the egg will implant on the wall of the uterus. This embryo will begin to develop into a baby. Genes from you and your partner are forming the baby. The female genes determine whether the baby is a boy or a girl. At 6-8 weeks, the eyes and face are formed, and the heartbeat can be seen on ultrasound. At the end of 12 weeks, all the baby's organs are formed.  Now that you are pregnant, you will want to do everything you can to have a healthy baby. Two of the most important things are to get good prenatal care and to follow your health care provider's instructions. Prenatal care is all the medical care you receive before the baby's birth. This care will help prevent, find, and treat any problems during the pregnancy and childbirth. BODY CHANGES Your body goes through many changes during pregnancy. The changes vary from woman to woman.   You may gain or lose a couple of pounds at first.  You may feel sick to your stomach (nauseous) and throw up (vomit). If the vomiting is uncontrollable, call your health care provider.  You may tire easily.  You may develop headaches that can be relieved by medicines approved by your health care provider.  You may urinate more often. Painful urination may mean you have a bladder infection.  You may develop heartburn as a result of your pregnancy.  You may develop constipation because certain hormones are causing the muscles that push waste through your intestines to slow down.  You may develop hemorrhoids or swollen, bulging veins (varicose veins).  Your breasts may begin to grow larger and become tender. Your nipples may stick out more, and the tissue that surrounds them (areola) may become darker.  Your gums may bleed and may be sensitive to brushing and flossing.  Dark spots or blotches (chloasma,  mask of pregnancy) may develop on your face. This will likely fade after the baby is born.  Your menstrual periods will stop.  You may have a loss of appetite.  You may develop cravings for certain kinds of food.  You may have changes in your emotions from day to day, such as being excited to be pregnant or being concerned that something may go wrong with the pregnancy and baby.  You may have more vivid and strange dreams.  You may have changes in your hair. These can include thickening of your hair, rapid growth, and changes in texture. Some women also have hair loss during or after pregnancy, or hair that feels dry or thin. Your hair will most likely return to normal after your baby is born. WHAT TO EXPECT AT YOUR PRENATAL VISITS During a routine prenatal visit:  You will be weighed to make sure you and the baby are growing normally.  Your blood pressure will be taken.  Your abdomen will be measured to track your baby's growth.  The fetal heartbeat will be listened to starting around week 10 or 12 of your pregnancy.  Test results from any previous visits will be discussed. Your health care provider may ask you:  How you are feeling.  If you are feeling the baby move.  If you have had any abnormal symptoms, such as leaking fluid, bleeding, severe headaches, or abdominal cramping.  If you are using any tobacco products,   including cigarettes, chewing tobacco, and electronic cigarettes.  If you have any questions. Other tests that may be performed during your first trimester include:  Blood tests to find your blood type and to check for the presence of any previous infections. They will also be used to check for low iron levels (anemia) and Rh antibodies. Later in the pregnancy, blood tests for diabetes will be done along with other tests if problems develop.  Urine tests to check for infections, diabetes, or protein in the urine.  An ultrasound to confirm the proper growth  and development of the baby.  An amniocentesis to check for possible genetic problems.  Fetal screens for spina bifida and Down syndrome.  You may need other tests to make sure you and the baby are doing well.  HIV (human immunodeficiency virus) testing. Routine prenatal testing includes screening for HIV, unless you choose not to have this test. HOME CARE INSTRUCTIONS  Medicines  Follow your health care provider's instructions regarding medicine use. Specific medicines may be either safe or unsafe to take during pregnancy.  Take your prenatal vitamins as directed.  If you develop constipation, try taking a stool softener if your health care provider approves. Diet  Eat regular, well-balanced meals. Choose a variety of foods, such as meat or vegetable-based protein, fish, milk and low-fat dairy products, vegetables, fruits, and whole grain breads and cereals. Your health care provider will help you determine the amount of weight gain that is right for you.  Avoid raw meat and uncooked cheese. These carry germs that can cause birth defects in the baby.  Eating four or five small meals rather than three large meals a day may help relieve nausea and vomiting. If you start to feel nauseous, eating a few soda crackers can be helpful. Drinking liquids between meals instead of during meals also seems to help nausea and vomiting.  If you develop constipation, eat more high-fiber foods, such as fresh vegetables or fruit and whole grains. Drink enough fluids to keep your urine clear or pale yellow. Activity and Exercise  Exercise only as directed by your health care provider. Exercising will help you:  Control your weight.  Stay in shape.  Be prepared for labor and delivery.  Experiencing pain or cramping in the lower abdomen or low back is a good sign that you should stop exercising. Check with your health care provider before continuing normal exercises.  Try to avoid standing for long  periods of time. Move your legs often if you must stand in one place for a long time.  Avoid heavy lifting.  Wear low-heeled shoes, and practice good posture.  You may continue to have sex unless your health care provider directs you otherwise. Relief of Pain or Discomfort  Wear a good support bra for breast tenderness.   Take warm sitz baths to soothe any pain or discomfort caused by hemorrhoids. Use hemorrhoid cream if your health care provider approves.   Rest with your legs elevated if you have leg cramps or low back pain.  If you develop varicose veins in your legs, wear support hose. Elevate your feet for 15 minutes, 3-4 times a day. Limit salt in your diet. Prenatal Care  Schedule your prenatal visits by the twelfth week of pregnancy. They are usually scheduled monthly at first, then more often in the last 2 months before delivery.  Write down your questions. Take them to your prenatal visits.  Keep all your prenatal visits as directed by your   health care provider. Safety  Wear your seat belt at all times when driving.  Make a list of emergency phone numbers, including numbers for family, friends, the hospital, and police and fire departments. General Tips  Ask your health care provider for a referral to a local prenatal education class. Begin classes no later than at the beginning of month 6 of your pregnancy.  Ask for help if you have counseling or nutritional needs during pregnancy. Your health care provider can offer advice or refer you to specialists for help with various needs.  Do not use hot tubs, steam rooms, or saunas.  Do not douche or use tampons or scented sanitary pads.  Do not cross your legs for long periods of time.  Avoid cat litter boxes and soil used by cats. These carry germs that can cause birth defects in the baby and possibly loss of the fetus by miscarriage or stillbirth.  Avoid all smoking, herbs, alcohol, and medicines not prescribed by  your health care provider. Chemicals in these affect the formation and growth of the baby.  Do not use any tobacco products, including cigarettes, chewing tobacco, and electronic cigarettes. If you need help quitting, ask your health care provider. You may receive counseling support and other resources to help you quit.  Schedule a dentist appointment. At home, brush your teeth with a soft toothbrush and be gentle when you floss. SEEK MEDICAL CARE IF:   You have dizziness.  You have mild pelvic cramps, pelvic pressure, or nagging pain in the abdominal area.  You have persistent nausea, vomiting, or diarrhea.  You have a bad smelling vaginal discharge.  You have pain with urination.  You notice increased swelling in your face, hands, legs, or ankles. SEEK IMMEDIATE MEDICAL CARE IF:   You have a fever.  You are leaking fluid from your vagina.  You have spotting or bleeding from your vagina.  You have severe abdominal cramping or pain.  You have rapid weight gain or loss.  You vomit blood or material that looks like coffee grounds.  You are exposed to German measles and have never had them.  You are exposed to fifth disease or chickenpox.  You develop a severe headache.  You have shortness of breath.  You have any kind of trauma, such as from a fall or a car accident.   This information is not intended to replace advice given to you by your health care provider. Make sure you discuss any questions you have with your health care provider.   Document Released: 09/14/2001 Document Revised: 10/11/2014 Document Reviewed: 07/31/2013 Elsevier Interactive Patient Education 2016 [employer]  

## 2016-04-15 NOTE — ED Provider Notes (Signed)
Time Seen: Approximately 1950  I have reviewed the triage notes  Chief Complaint: Abdominal Pain   History of Present Illness: Erika Solomon is a 20 y.o. female *who states that she missed her last menstrual period earlier this month. She states her menstrual periods are fairly regular and urine pregnancy test was actually positive. She would be gravida 3 para 2 no complications from her previous pregnancies. Patient states she's had some nausea with no vomiting. Occasional loose stool but no persistent diarrhea. She denies any melena or hematochezia. She denies any vaginal bleeding or discharge. She points to this historian toward the lower part of her abdomen is source of discomfort and points to both lower abdominal regions. She denies any back or flank pain   Past Medical History  Diagnosis Date  . Anxiety and depression     Patient Active Problem List   Diagnosis Date Noted  . Pregnancy 05/21/2015  . Indication for care in labor or delivery 05/21/2015  . Mild preeclampsia 05/21/2015  . Labor and delivery indication for care or intervention 04/24/2015  . Variable fetal heart rate decelerations, antepartum 04/23/2015  . Fetal heart rate decelerations affecting management of mother 04/23/2015  . Nausea/vomiting in pregnancy 02/13/2015  . Pregnant and not yet delivered 02/12/2015  . Nausea and vomiting in pregnancy 02/12/2015    Past Surgical History  Procedure Laterality Date  . No past surgery      Past Surgical History  Procedure Laterality Date  . No past surgery      Current Outpatient Rx  Name  Route  Sig  Dispense  Refill  . cyclobenzaprine (FLEXERIL) 10 MG tablet   Oral   Take 1 tablet (10 mg total) by mouth every 8 (eight) hours as needed for muscle spasms.   15 tablet   0   . docusate sodium (COLACE) 100 MG capsule   Oral   Take 1 capsule (100 mg total) by mouth 2 (two) times daily as needed for mild constipation.   45 capsule   1   . famotidine  (PEPCID) 20 MG tablet   Oral   Take 1 tablet (20 mg total) by mouth 2 (two) times daily.   60 tablet   4   . ibuprofen (ADVIL,MOTRIN) 600 MG tablet   Oral   Take 1 tablet (600 mg total) by mouth every 6 (six) hours as needed.   30 tablet   0   . ondansetron (ZOFRAN-ODT) 4 MG disintegrating tablet   Oral   Take 4 mg by mouth every 8 (eight) hours as needed for nausea or vomiting.         Marland Kitchen oseltamivir (TAMIFLU) 75 MG capsule   Oral   Take 1 capsule (75 mg total) by mouth 2 (two) times daily.   10 capsule   0   . Prenatal Vit-Fe Fumarate-FA (PRENATAL MULTIVITAMIN) TABS tablet   Oral   Take 1 tablet by mouth daily.          . promethazine-dextromethorphan (PROMETHAZINE-DM) 6.25-15 MG/5ML syrup   Oral   Take 5 mLs by mouth 4 (four) times daily as needed for cough.   118 mL   0     Allergies:  Albuterol; Benadryl; Buspar; and Strawberry extract  Family History: Family History  Problem Relation Age of Onset  . Diabetes Maternal Grandfather   . Hypertension Father   . Hypertension Maternal Grandmother     Social History: Social History  Substance Use Topics  .  Smoking status: Former Games developermoker  . Smokeless tobacco: None  . Alcohol Use: 0.0 oz/week    0 Standard drinks or equivalent per week     Comment: occasional     Review of Systems:   10 point review of systems was performed and was otherwise negative:  Constitutional: No fever Eyes: No visual disturbances ENT: No sore throat, ear pain Cardiac: No chest pain Respiratory: No shortness of breath, wheezing, or stridor Abdomen: No abdominal pain, no vomiting, No diarrhea Endocrine: No weight loss, No night sweats Extremities: No peripheral edema, cyanosis Skin: No rashes, easy bruising Neurologic: No focal weakness, trouble with speech or swollowing Urologic: Urinary frequency without hematuria or dysuria   Physical Exam:  ED Triage Vitals  Enc Vitals Group     BP 04/15/16 1903 135/85 mmHg      Pulse Rate 04/15/16 1903 88     Resp 04/15/16 1903 18     Temp 04/15/16 1903 98.2 F (36.8 C)     Temp Source 04/15/16 1903 Oral     SpO2 04/15/16 1903 99 %     Weight 04/15/16 1903 186 lb (84.369 kg)     Height 04/15/16 1903 5' 3.5" (1.613 m)     Head Cir --      Peak Flow --      Pain Score 04/15/16 1904 6     Pain Loc --      Pain Edu? --      Excl. in GC? --     General: Awake , Alert , and Oriented times 3; GCS 15 Head: Normal cephalic , atraumatic Eyes: Pupils equal , round, reactive to light Nose/Throat: No nasal drainage, patent upper airway without erythema or exudate.  Neck: Supple, Full range of motion, No anterior adenopathy or palpable thyroid masses Lungs: Clear to ascultation without wheezes , rhonchi, or rales Heart: Regular rate, regular rhythm without murmurs , gallops , or rubs Abdomen: Soft, non tender without rebound, guarding , or rigidity; bowel sounds positive and symmetric in all 4 quadrants. No organomegaly .        Extremities: 2 plus symmetric pulses. No edema, clubbing or cyanosis Neurologic: normal ambulation, Motor symmetric without deficits, sensory intact Skin: warm, dry, no rashes   Labs:   All laboratory work was reviewed including any pertinent negatives or positives listed below:  Labs Reviewed  CBC - Abnormal; Notable for the following:    Hemoglobin 11.0 (*)    HCT 32.8 (*)    MCV 78.3 (*)    RDW 15.1 (*)    All other components within normal limits  URINALYSIS COMPLETEWITH MICROSCOPIC ([facility] ONLY) - Abnormal; Notable for the following:    Color, Urine YELLOW (*)    APPearance CLOUDY (*)    Protein, ur 30 (*)    Leukocytes, UA 3+ (*)    Bacteria, UA RARE (*)    Squamous Epithelial / LPF 6-30 (*)    All other components within normal limits  LIPASE, BLOOD  COMPREHENSIVE METABOLIC PANEL  HCG, QUANTITATIVE, PREGNANCY  POC URINE PREG, ED  POCT PREGNANCY, URINE  Patient has some findings of a mild urinary tract infection. Serum  quantitative pregnancy is extremely low.  EKG ED ECG REPORT I, Jennye MoccasinBrian S Ameliana Brashear, the attending physician, personally viewed and interpreted this ECG.  Date: 04/15/2016 EKG Time: 1911 Rate: 86 Rhythm: normal sinus rhythm QRS Axis: normal Intervals: normal ST/T Wave abnormalities: normal Conduction Disturbances: none Narrative Interpretation: unremarkable Normal Radiology: *  US OB Transvaginal (Final result) Result time: 04/15/16 21:26:14   Final result by Rad Results In Interface (04/15/16 21:26:14)   Narrative:   CLINICAL DATA: Lower abdominal pain for 2-3 weeks the pregnancy. Estimated gestational age by LMP is 4 weeks 3 days . Quantitative beta HCG is pending but urine pregnancy test is positive.  EXAM: OBSTETRIC <14 WK [place] AND TRANSVAGINAL OB US  TECHNIQUE: Both transabdominal and transvaginal ultrasound examinations were performed for complete evaluation of the gestation as well as the maternal uterus, adnexal regions, and pelvic cul-de-sac. Transvaginal technique was performed to assess early pregnancy.  COMPARISON: None.  FINDINGS: Intrauterine gestational sac: No definitive injuring gestational sac within that. There is a small amount of complex fluid in the endometrium at the level of the fundus measuring about 6.5 mm maximal diameter. This is indeterminate but could represent early or wall gestational sac.  Yolk sac: Not identified.  Embryo: Not identified.  Cardiac Activity: Not identified.  The that tear of the indeterminate intrauterine collection: 6 mm 5 w 2 d  Subchorionic hemorrhage: None visualized.  Maternal uterus/adnexae: Uterus is anteverted. No myometrial mass lesions identified. Both ovaries are visualized and appear normal. Presumed corpus luteum cyst on the right ovary measuring 2 cm maximal diameter.  IMPRESSION: Small complex fluid collection in the endometrium is indeterminate and could represent loculated fluid  or an abnormal/early gestational sac. No definitive intrauterine gestational sac, yolk sac, or fetal pole identified. Differential considerations include intrauterine pregnancy too early to be sonographically visualized, missed abortion, or ectopic pregnancy. Followup ultrasound is recommended in 10-14 days for further evaluation.   Electronically Signed By: Burman Nieves M.D. On: 04/15/2016 21:26      I personally reviewed the radiologic studies     ED Course:  Patient's stay was uneventful and her pain resolved over time. She was evaluated for the possibility of an ectopic pregnancy. She is extremely early in her pregnancy and course is difficult to ascertain and will need to have her serum quantitative pregnancy test followed on an outpatient basis. I discussed the case briefly with Dr. Bonney Aid who is on call for [facility] side OB/GYN. The patient's been advised to come into the outpatient lab and have a repeat serum quantitative test performed. She should come in on Saturday 7/15. She was also prescribed some Macrodantin for the possibility of urinary tract infection.    Assessment: * Early first trimester pregnancy Unspecified lower abdominal pain Urinary tract infection      Plan: * Outpatient Patient was advised to return immediately if condition worsens. Patient was advised to follow up with their primary care physician or other specialized physicians involved in their outpatient care. The patient and/or family member/power of attorney had laboratory results reviewed at the bedside. All questions and concerns were addressed and appropriate discharge instructions were distributed by the nursing staff.             Jennye Moccasin, MD 04/15/16 [phone]

## 2016-04-15 NOTE — ED Notes (Signed)
Pt's preg test put in error, Upreg was positive

## 2016-04-15 NOTE — ED Notes (Signed)
Patient transported to Ultrasound 

## 2016-04-17 ENCOUNTER — Other Ambulatory Visit
Admission: RE | Admit: 2016-04-17 | Discharge: 2016-04-17 | Disposition: A | Discharge status: Home / Self Care | Payer: Medicaid Other | Source: Ambulatory Visit | Attending: Emergency Medicine | Admitting: Emergency Medicine

## 2016-04-17 DIAGNOSIS — Z3200 Encounter for pregnancy test, result unknown: Secondary | ICD-10-CM | POA: Diagnosis not present

## 2016-04-17 LAB — HCG, QUANTITATIVE, PREGNANCY: hCG, Beta Chain, Quant, S: 321 m[IU]/mL — ABNORMAL HIGH (ref <5)

## 2016-04-28 ENCOUNTER — Encounter: Payer: Self-pay | Admitting: Emergency Medicine

## 2016-04-28 ENCOUNTER — Emergency Department
Admission: EM | Admit: 2016-04-28 | Discharge: 2016-04-28 | Disposition: A | Discharge status: Home / Self Care | Payer: Medicaid Other | Attending: Emergency Medicine | Admitting: Emergency Medicine

## 2016-04-28 DIAGNOSIS — O9A211 Injury, poisoning and certain other consequences of external causes complicating pregnancy, first trimester: Secondary | ICD-10-CM | POA: Insufficient documentation

## 2016-04-28 DIAGNOSIS — Z87891 Personal history of nicotine dependence: Secondary | ICD-10-CM | POA: Diagnosis not present

## 2016-04-28 DIAGNOSIS — W57XXXA Bitten or stung by nonvenomous insect and other nonvenomous arthropods, initial encounter: Secondary | ICD-10-CM | POA: Insufficient documentation

## 2016-04-28 DIAGNOSIS — S90465A Insect bite (nonvenomous), left lesser toe(s), initial encounter: Secondary | ICD-10-CM | POA: Insufficient documentation

## 2016-04-28 DIAGNOSIS — Y999 Unspecified external cause status: Secondary | ICD-10-CM | POA: Insufficient documentation

## 2016-04-28 DIAGNOSIS — Z3A01 Less than 8 weeks gestation of pregnancy: Secondary | ICD-10-CM | POA: Insufficient documentation

## 2016-04-28 DIAGNOSIS — Y929 Unspecified place or not applicable: Secondary | ICD-10-CM | POA: Insufficient documentation

## 2016-04-28 DIAGNOSIS — Y939 Activity, unspecified: Secondary | ICD-10-CM | POA: Insufficient documentation

## 2016-04-28 DIAGNOSIS — Z79899 Other long term (current) drug therapy: Secondary | ICD-10-CM | POA: Insufficient documentation

## 2016-04-28 DIAGNOSIS — M79675 Pain in left toe(s): Secondary | ICD-10-CM | POA: Diagnosis present

## 2016-04-28 MED ORDER — TRIAMCINOLONE ACETONIDE 0.1 % EX CREA: 1.0000 "application " | TOPICAL_CREAM | Freq: Four times a day (QID) | CUTANEOUS | 0 refills | Status: DC

## 2016-04-28 NOTE — ED Triage Notes (Addendum)
Pt presents to ED with reports of left second and middle toe swelling and pain. Pt denies injury. Pt states she is three weeks pregnant.

## 2016-04-28 NOTE — ED Provider Notes (Signed)
[facility] Emergency Department Provider Note  ____________________________________________  Time seen: Approximately 5:30 PM  I have reviewed the triage vital signs and the nursing notes.   HISTORY  Chief Complaint Toe Pain    HPI Erika Solomon is a 20 y.o. female who presents emergency department complaining of irritation to her second and third digit of the left foot. Patient denies any trauma to either digit. She describes the sensation as an itching/burning sensation. Patient reports 2 small "blisters" to the toes that are irritated with wearing shoes. Patient denies any pain with weightbearing but states that it does hurt to ambulate when the toes were up against each other. Patient denies any other complaints to include foot or calf pain, edema, fevers or chills. Patient is pregnant and is G3 P2. Patient denies any abdominal pain, nausea or vomiting, vaginal discharge or bleeding.   Past Medical History:  Diagnosis Date  . Anxiety and depression     Patient Active Problem List   Diagnosis Date Noted  . Pregnancy 05/21/2015  . Indication for care in labor or delivery 05/21/2015  . Mild preeclampsia 05/21/2015  . Labor and delivery indication for care or intervention 04/24/2015  . Variable fetal heart rate decelerations, antepartum 04/23/2015  . Fetal heart rate decelerations affecting management of mother 04/23/2015  . Nausea/vomiting in pregnancy 02/13/2015  . Pregnant and not yet delivered 02/12/2015  . Nausea and vomiting in pregnancy 02/12/2015    Past Surgical History:  Procedure Laterality Date  . No past surgery      Prior to Admission medications   Medication Sig Start Date End Date Taking? Authorizing Provider  cyclobenzaprine (FLEXERIL) 10 MG tablet Take 1 tablet (10 mg total) by mouth every 8 (eight) hours as needed for muscle spasms. 07/20/15   Joni Reining, PA-C  docusate sodium (COLACE) 100 MG capsule Take 1 capsule (100 mg  total) by mouth 2 (two) times daily as needed for mild constipation. 05/23/15   [place] Bing, MD  famotidine (PEPCID) 20 MG tablet Take 1 tablet (20 mg total) by mouth 2 (two) times daily. 02/13/15   Chelsea C Ward, MD  ibuprofen (ADVIL,MOTRIN) 600 MG tablet Take 1 tablet (600 mg total) by mouth every 6 (six) hours as needed. 10/30/15   Tommi Rumps, PA-C  nitrofurantoin (MACRODANTIN) 100 MG capsule Take 1 capsule (100 mg total) by mouth 4 (four) times daily. 04/15/16   Jennye Moccasin, MD  ondansetron (ZOFRAN-ODT) 4 MG disintegrating tablet Take 4 mg by mouth every 8 (eight) hours as needed for nausea or vomiting.    Historical Provider, MD  oseltamivir (TAMIFLU) 75 MG capsule Take 1 capsule (75 mg total) by mouth 2 (two) times daily. 12/15/15   Jami L Hagler, PA-C  Prenatal Vit-Fe Fumarate-FA (PRENATAL MULTIVITAMIN) TABS tablet Take 1 tablet by mouth daily.     Historical Provider, MD  promethazine-dextromethorphan (PROMETHAZINE-DM) 6.25-15 MG/5ML syrup Take 5 mLs by mouth 4 (four) times daily as needed for cough. 12/15/15   Jami L Hagler, PA-C  triamcinolone cream (KENALOG) 0.1 % Apply 1 application topically 4 (four) times daily. 04/28/16   Delorise Royals Luisalberto Beegle, PA-C    Allergies Albuterol; Benadryl [diphenhydramine]; Buspar [buspirone]; and Strawberry extract  Family History  Problem Relation Age of Onset  . Diabetes Maternal Grandfather   . Hypertension Father   . Hypertension Maternal Grandmother     Social History Social History  Substance Use Topics  . Smoking status: Former Games developer  . Smokeless  tobacco: Not on file  . Alcohol use 0.0 oz/week     Comment: occasional     Review of Systems  Constitutional: No fever/chills Eyes: No visual changes. No discharge ENT: No upper respiratory complaints. Cardiovascular: no chest pain. Respiratory: no cough. No SOB. Gastrointestinal: No abdominal pain.  No nausea, no vomiting.  No diarrhea.  No constipation. Genitourinary: Negative  for dysuria. No hematuria. No vaginal discharge or bleeding. Musculoskeletal: Positive for irritation to the second and third digit of the left foot. Skin: Negative for rash, abrasions, lacerations, ecchymosis. Neurological: Negative for headaches, focal weakness or numbness. 10-point ROS otherwise negative.  ____________________________________________   PHYSICAL EXAM:  VITAL SIGNS: ED Triage Vitals [04/28/16 1717]  Enc Vitals Group     BP 117/67     Pulse Rate 78     Resp 18     Temp 98.2 F (36.8 C)     Temp Source Oral     SpO2 99 %     Weight 186 lb (84.4 kg)     Height 5' 3.5" (1.613 m)     Head Circumference      Peak Flow      Pain Score 0     Pain Loc      Pain Edu?      Excl. in GC?      Constitutional: Alert and oriented. Well appearing and in no acute distress. Eyes: Conjunctivae are normal. PERRL. EOMI. Head: Atraumatic. Cardiovascular: Normal rate, regular rhythm. Normal S1 and S2.  Good peripheral circulation. Respiratory: Normal respiratory effort without tachypnea or retractions. Lungs CTAB. Good air entry to the bases with no decreased or absent breath sounds. Gastrointestinal: Bowel sounds 4 quadrants. Soft and nontender to palpation. No guarding or rigidity. No palpable masses. Fundus is not palpable. No distention. No CVA tenderness. Musculoskeletal: Full range of motion to all extremities. No gross deformities appreciated. No deformities noted to left foot and left toes upon inspection. Full range of motion to left ankle, foot, all digits left foot. Sensation and cap refill intact 5 digits. Neurologic:  Normal speech and language. No gross focal neurologic deficits are appreciated.  Skin:  Skin is warm, dry and intact. No rash noted. Tiny less than 2 mm in diameter blister noted to the lateral aspect of the second digit and dorsal aspect of the third digit left foot. No surrounding erythema or edema. Area is tender to palpation. No palpable abnormality  to osseous structures underlying this. Full range of motion both digits. Cap refill intact distally digits. Psychiatric: Mood and affect are normal. Speech and behavior are normal. Patient exhibits appropriate insight and judgement.   ____________________________________________   LABS (all labs ordered are listed, but only abnormal results are displayed)  Labs Reviewed - No data to display ____________________________________________  EKG   ____________________________________________  RADIOLOGY   No results found.  ____________________________________________    PROCEDURES  Procedure(s) performed:    Procedures    Medications - No data to display   ____________________________________________   INITIAL IMPRESSION / ASSESSMENT AND PLAN / ED COURSE  Pertinent labs & imaging results that were available during my care of the patient were reviewed by me and considered in my medical decision making (see chart for details).  Clinical Course    Patient's diagnosis is consistent with Bug bites to the second and third digit of the left foot. No signs of infection at this time. Exam is reassuring.. Patient will be discharged home with prescriptions for triamcinolone ointment  for symptom relief. Patient is informed that she can take Tylenol for additional symptom control but to avoid other pain relieving medications such as NSAIDs due to pregnancy status.. Patient is to follow up with primary care provider or OB/GYN as needed or otherwise directed. Patient is given ED precautions to return to the ED for any worsening or new symptoms.     ____________________________________________  FINAL CLINICAL IMPRESSION(S) / ED DIAGNOSES  Final diagnoses:  Bug bite      NEW MEDICATIONS STARTED DURING THIS VISIT:  New Prescriptions   TRIAMCINOLONE CREAM (KENALOG) 0.1 %    Apply 1 application topically 4 (four) times daily.        This chart was dictated using voice  recognition software/Dragon. Despite best efforts to proofread, errors can occur which can change the meaning. Any change was purely unintentional.    Racheal Patches, PA-C 04/28/16 1739    Governor Rooks, MD 04/28/16 [phone]

## 2016-05-13 ENCOUNTER — Encounter (HOSPITAL_COMMUNITY): Payer: Self-pay | Admitting: Emergency Medicine

## 2016-05-13 ENCOUNTER — Emergency Department (HOSPITAL_COMMUNITY)
Admission: EM | Admit: 2016-05-13 | Discharge: 2016-05-13 | Disposition: A | Discharge status: Home / Self Care | Payer: Medicaid Other | Attending: Emergency Medicine | Admitting: Emergency Medicine

## 2016-05-13 DIAGNOSIS — O26891 Other specified pregnancy related conditions, first trimester: Secondary | ICD-10-CM | POA: Diagnosis present

## 2016-05-13 DIAGNOSIS — O209 Hemorrhage in early pregnancy, unspecified: Secondary | ICD-10-CM | POA: Diagnosis not present

## 2016-05-13 DIAGNOSIS — Z87891 Personal history of nicotine dependence: Secondary | ICD-10-CM | POA: Diagnosis not present

## 2016-05-13 DIAGNOSIS — Z79899 Other long term (current) drug therapy: Secondary | ICD-10-CM | POA: Insufficient documentation

## 2016-05-13 DIAGNOSIS — Z3A08 8 weeks gestation of pregnancy: Secondary | ICD-10-CM | POA: Insufficient documentation

## 2016-05-13 LAB — URINALYSIS, ROUTINE W REFLEX MICROSCOPIC
Bilirubin Urine: NEGATIVE
Bilirubin Urine: NEGATIVE
Glucose, UA: NEGATIVE mg/dL
Glucose, UA: NEGATIVE mg/dL
Hgb urine dipstick: NEGATIVE
Hgb urine dipstick: NEGATIVE
Ketones, ur: 40 mg/dL — AB
Ketones, ur: >80 mg/dL — AB
Nitrite: NEGATIVE
Nitrite: NEGATIVE
Protein, ur: 30 mg/dL — AB
Protein, ur: NEGATIVE mg/dL
Specific Gravity, Urine: 1.024 (ref 1.005–1.030)
Specific Gravity, Urine: 1.026 (ref 1.005–1.030)
pH: 6 (ref 5.0–8.0)
pH: 6 (ref 5.0–8.0)

## 2016-05-13 LAB — CBC
HCT: 34.7 % — ABNORMAL LOW (ref 36.0–46.0)
Hemoglobin: 11.3 g/dL — ABNORMAL LOW (ref 12.0–15.0)
MCH: 26.6 pg (ref 26.0–34.0)
MCHC: 32.6 g/dL (ref 30.0–36.0)
MCV: 81.6 fL (ref 78.0–100.0)
Platelets: 203 10*3/uL (ref 150–400)
RBC: 4.25 MIL/uL (ref 3.87–5.11)
RDW: 15.2 % (ref 11.5–15.5)
WBC: 7.4 10*3/uL (ref 4.0–10.5)

## 2016-05-13 LAB — WET PREP, GENITAL
Sperm: NONE SEEN
Trich, Wet Prep: NONE SEEN
Yeast Wet Prep HPF POC: NONE SEEN

## 2016-05-13 LAB — URINE MICROSCOPIC-ADD ON: RBC / HPF: NONE SEEN RBC/hpf (ref 0–5)

## 2016-05-13 LAB — POC URINE PREG, ED: Preg Test, Ur: POSITIVE — AB

## 2016-05-13 MED ORDER — ACETAMINOPHEN 325 MG PO TABS
650.0000 mg | ORAL_TABLET | Freq: Once | ORAL | Status: AC
  Administered 2016-05-13: 650 mg via ORAL
  Filled 2016-05-13: qty 2

## 2016-05-13 MED ORDER — CEPHALEXIN 250 MG PO CAPS
500.0000 mg | ORAL_CAPSULE | Freq: Once | ORAL | Status: AC
  Administered 2016-05-13: 500 mg via ORAL
  Filled 2016-05-13: qty 2

## 2016-05-13 MED ORDER — CEPHALEXIN 500 MG PO CAPS: 500.0000 mg | ORAL_CAPSULE | Freq: Two times a day (BID) | ORAL | 0 refills | Status: AC

## 2016-05-13 NOTE — ED Notes (Signed)
Pelvic cart at bedside. 

## 2016-05-13 NOTE — ED Notes (Signed)
Esignature not working at this time. Pt verbally acknowledges dc instructions and is agreeable to dc.

## 2016-05-13 NOTE — ED Triage Notes (Signed)
Pt. Stated, I am 3-[redacted] weeks pregnant.  I woke this morning and had blood on my bed and all on my clothes. Not bleeding now. Unable to see my doctor.

## 2016-05-13 NOTE — ED Provider Notes (Signed)
[facility]-EMERGENCY DEPT Provider Note   CSN: [id number] Arrival date & time: 05/13/16  1253  First Provider Contact:  None       History   Chief Complaint Chief Complaint  Patient presents with  . Abdominal Pain  . Vaginal Bleeding    HPI Erika Solomon is a 20 y.o. female.  The history is provided by the patient. No language interpreter was used.  Vaginal Bleeding  Primary symptoms include vaginal bleeding.  Primary symptoms include no discharge, no pelvic pain, no genital pain, no genital odor, no dysuria. There has been no fever. This is a new problem. The current episode started 6 to 12 hours ago (overnight). Episode frequency: once. The problem has been resolved. The symptoms occur at rest. She is pregnant. She has missed her period. Her LMP was months ago. Vaginal discharge characteristics: none. Associated symptoms include abdominal pain (lower abd pain, crampy, for over a week), diarrhea and nausea (not currently though). Pertinent negatives include no anorexia, no diaphoresis, no abdominal swelling, no vomiting, no frequency and no light-headedness. She has tried nothing for the symptoms. Sexual activity: sexually active. There is no concern regarding sexually transmitted diseases. Contraceptive use: N/D. Associated medical issues do not include PID, ovarian cysts, ectopic pregnancy, Cesarean section, miscarriage or terminated pregnancy.    Past Medical History:  Diagnosis Date  . Anxiety and depression   . Pregnant     Patient Active Problem List   Diagnosis Date Noted  . Pregnancy 05/21/2015  . Indication for care in labor or delivery 05/21/2015  . Mild preeclampsia 05/21/2015  . Labor and delivery indication for care or intervention 04/24/2015  . Variable fetal heart rate decelerations, antepartum 04/23/2015  . Fetal heart rate decelerations affecting management of mother 04/23/2015  . Nausea/vomiting in pregnancy 02/13/2015  . Pregnant and not yet delivered  02/12/2015  . Nausea and vomiting in pregnancy 02/12/2015    Past Surgical History:  Procedure Laterality Date  . No past surgery      OB History    Gravida Para Term Preterm AB Living   2 2 2  0 0 2   SAB TAB Ectopic Multiple Live Births   0 0 0 0 2       Home Medications    Prior to Admission medications   Medication Sig Start Date End Date Taking? Authorizing Provider  cyclobenzaprine (FLEXERIL) 10 MG tablet Take 1 tablet (10 mg total) by mouth every 8 (eight) hours as needed for muscle spasms. 07/20/15   Joni Reining, PA-C  docusate sodium (COLACE) 100 MG capsule Take 1 capsule (100 mg total) by mouth 2 (two) times daily as needed for mild constipation. 05/23/15    Bing, MD  famotidine (PEPCID) 20 MG tablet Take 1 tablet (20 mg total) by mouth 2 (two) times daily. 02/13/15   Chelsea C Ward, MD  ibuprofen (ADVIL,MOTRIN) 600 MG tablet Take 1 tablet (600 mg total) by mouth every 6 (six) hours as needed. 10/30/15   Tommi Rumps, PA-C  nitrofurantoin (MACRODANTIN) 100 MG capsule Take 1 capsule (100 mg total) by mouth 4 (four) times daily. 04/15/16   Jennye Moccasin, MD  ondansetron (ZOFRAN-ODT) 4 MG disintegrating tablet Take 4 mg by mouth every 8 (eight) hours as needed for nausea or vomiting.    Historical Provider, MD  oseltamivir (TAMIFLU) 75 MG capsule Take 1 capsule (75 mg total) by mouth 2 (two) times daily. 12/15/15   Jami L Hagler, PA-C  Prenatal Vit-Fe Fumarate-FA (  PRENATAL MULTIVITAMIN) TABS tablet Take 1 tablet by mouth daily.     Historical Provider, MD  promethazine-dextromethorphan (PROMETHAZINE-DM) 6.25-15 MG/5ML syrup Take 5 mLs by mouth 4 (four) times daily as needed for cough. 12/15/15   Jami L Hagler, PA-C  triamcinolone cream (KENALOG) 0.1 % Apply 1 application topically 4 (four) times daily. 04/28/16   Delorise Royals Cuthriell, PA-C    Family History Family History  Problem Relation Age of Onset  . Diabetes Maternal Grandfather   . Hypertension Father     . Hypertension Maternal Grandmother     Social History Social History  Substance Use Topics  . Smoking status: Former Games developer  . Smokeless tobacco: Former Neurosurgeon  . Alcohol use 0.0 oz/week     Comment: occasional     Allergies   Albuterol; Benadryl [diphenhydramine]; Buspar [buspirone]; and Strawberry extract   Review of Systems Review of Systems  Constitutional: Negative for diaphoresis.  HENT: Negative.   Eyes: Negative.   Respiratory: Negative for cough and shortness of breath.   Cardiovascular: Negative for chest pain.  Gastrointestinal: Positive for abdominal pain (lower abd pain, crampy, for over a week), diarrhea and nausea (not currently though). Negative for anorexia and vomiting.  Genitourinary: Positive for vaginal bleeding. Negative for dysuria, frequency and pelvic pain.  Musculoskeletal: Negative for back pain.  Skin: Negative for pallor and rash.  Neurological: Negative for syncope and light-headedness.  Psychiatric/Behavioral: Negative.      Physical Exam Updated Vital Signs BP 129/66 (BP Location: Left Arm)   Pulse 85   Temp 98.6 F (37 C) (Oral)   Resp 18   Ht 5' 3.5" (1.613 m)   Wt 83 kg   LMP 03/15/2016   SpO2 100%   BMI 31.91 kg/m   Physical Exam  Constitutional: She is oriented to person, place, and time. She appears well-developed and well-nourished. No distress.  HENT:  Head: Normocephalic and atraumatic.  Eyes: Conjunctivae are normal. Right eye exhibits no discharge. Left eye exhibits no discharge. No scleral icterus.  Neck: Normal range of motion. Neck supple. No tracheal deviation present.  Cardiovascular: Normal rate, regular rhythm, normal heart sounds and intact distal pulses.   No murmur heard. Pulmonary/Chest: Effort normal and breath sounds normal. No stridor. No respiratory distress. She has no wheezes. She has no rales.  Abdominal: Soft. She exhibits distension (mild). There is tenderness (mild in lower abd). There is no  rebound and no guarding.  No CVA tenderness  Musculoskeletal: She exhibits no edema or tenderness.  Neurological: She is alert and oriented to person, place, and time. GCS eye subscore is 4. GCS verbal subscore is 5. GCS motor subscore is 6.  Skin: Skin is warm and dry. Capillary refill takes less than 2 seconds. She is not diaphoretic.  Psychiatric: She has a normal mood and affect. Her behavior is normal. Judgment and thought content normal.  Nursing note and vitals reviewed.    ED Treatments / Results  Labs (all labs ordered are listed, but only abnormal results are displayed) Labs Reviewed  URINE CULTURE - Abnormal; Notable for the following:       Result Value   Culture   (*)    Value: MULTIPLE SPECIES PRESENT, SUGGEST RECOLLECTION 50,000 COLONIES/mL GROUP B STREP(S.AGALACTIAE)ISOLATED There is no known Penicillin Resistant Beta Streptococcus in the [place] For patients that are Penicillin-allergic, Erythromycin is 85-94% susceptible, and Clindamycin is 80% susceptible.  Contact Microbiology within 7 days if sensitivity testing is  required.   CRITICAL RESULT  CALLED TO, READ BACK BY AND VERIFIED WITH: RN L.BERBIK [phone] MLM    All other components within normal limits  WET PREP, GENITAL - Abnormal; Notable for the following:    Clue Cells Wet Prep HPF POC PRESENT (*)    WBC, Wet Prep HPF POC MANY (*)    All other components within normal limits  URINALYSIS, ROUTINE W REFLEX MICROSCOPIC (NOT AT [facility]) - Abnormal; Notable for the following:    APPearance CLOUDY (*)    Ketones, ur 40 (*)    Protein, ur 30 (*)    Leukocytes, UA LARGE (*)    All other components within normal limits  CBC - Abnormal; Notable for the following:    Hemoglobin 11.3 (*)    HCT 34.7 (*)    All other components within normal limits  URINE MICROSCOPIC-ADD ON - Abnormal; Notable for the following:    Squamous Epithelial / LPF 6-30 (*)    Bacteria, UA MANY (*)    All other components within normal  limits  URINALYSIS, ROUTINE W REFLEX MICROSCOPIC (NOT AT [facility]) - Abnormal; Notable for the following:    APPearance CLOUDY (*)    Ketones, ur >80 (*)    Leukocytes, UA MODERATE (*)    All other components within normal limits  URINE MICROSCOPIC-ADD ON - Abnormal; Notable for the following:    Squamous Epithelial / LPF 0-5 (*)    Bacteria, UA FEW (*)    All other components within normal limits  POC URINE PREG, ED - Abnormal; Notable for the following:    Preg Test, Ur POSITIVE (*)    All other components within normal limits  GC/CHLAMYDIA PROBE AMP (Crisp) NOT AT [facility]    EKG  EKG Interpretation None       Radiology No results found.  Procedures Procedures (including critical care time)  EMERGENCY DEPARTMENT US PREGNANCY "Study: Limited Ultrasound of the Pelvis"  INDICATIONS:Vaginal bleeding Multiple views of the uterus and pelvic cavity are obtained with a multi-frequency probe.  APPROACH:Transabdominal   PERFORMED BY: Myself  IMAGES ARCHIVED?: Yes  LIMITATIONS: none  PREGNANCY FREE FLUID: None  PREGNANCY UTERUS FINDINGS:Uterus enlarged   ADNEXAL FINDINGS:Left ovary not seen, R appears WNL.  PREGNANCY FINDINGS: Fetal pole present and Fetal heart activity seen  INTERPRETATION: Viable intrauterine pregnancy  GESTATIONAL AGE, ESTIMATE: 921w2d  FETAL HEART RATE: 176 BPM  Medications Ordered in ED Medications  acetaminophen (TYLENOL) tablet 650 mg (650 mg Oral Given 05/13/16 1616)  cephALEXin (KEFLEX) capsule 500 mg (500 mg Oral Given 05/13/16 1808)     Initial Impression / Assessment and Plan / ED Course  I have reviewed the triage vital signs and the nursing notes.  Pertinent labs & imaging results that were available during my care of the patient were reviewed by me and considered in my medical decision making (see chart for details).  Clinical Course   20 year old G3P2 female presents with vaginal bleeding during pregnancy.  She has been  evaluated in ED prior and was found to be pregnant however fetus was unabe to be visualized on ultrasound at that time. In The setting of bleeding, patient was reevaluated for possible miscarriage.  Pelvic exam was grossly unremarkable with no significant bleeding.  Os was closed.  No POC visible.  Transabdominal ultrasound revealed a fetal pole with a visible heart rate as documented above.  As result, patient is likely a little over [redacted] weeks pregnant, which is consistent with her LMP per her report.  Discussed reassuring findings with patient.  Stated thought that if bleeding were to recur, patient will be reevaluated.  Also encouraged following up with her OB since patient had evidence of asymptomatic UTI on multiple UAs, s/p multiple courses of antibiotics, including being started on Keflex today for prophylaxis. No clinical signs of pyelonephritis, no CVA tenderness. Patient reported having a headache during evaluation. Thus was given Tylenol. Otherwise patient and vital signs are stable at discharge. Patient is not severely anemic either. Patient is in agreement with plan and states understanding.  Patient and vital signs stable at discharge. All questions answered. Patient was told she would be contacted if [facility] or cultures were concerning and needed Abx change.  Final Clinical Impressions(s) / ED Diagnoses   Final diagnoses:  Vaginal bleeding in pregnancy, first trimester    New Prescriptions Discharge Medication List as of 05/13/2016  6:13 PM    START taking these medications   Details  cephALEXin (KEFLEX) 500 MG capsule Take 1 capsule (500 mg total) by mouth 2 (two) times daily., Starting Thu 05/13/2016, Until Tue 05/18/2016, Print          Maretta Bees, MD 05/16/16 4098    Arby Barrette, MD 06/22/16 1191

## 2016-05-14 LAB — URINE CULTURE

## 2016-05-14 LAB — GC/CHLAMYDIA PROBE AMP (~~LOC~~) NOT AT ARMC
Chlamydia: NEGATIVE
Neisseria Gonorrhea: NEGATIVE

## 2016-05-14 NOTE — ED Notes (Signed)
Lab called and reported that pt.s urine culture had resulted.  Information given to Dr. Madilyn Hookees,  No orders received.  Pt. Prescribed Keflex 500mg .

## 2016-05-15 ENCOUNTER — Encounter: Payer: Self-pay | Admitting: Emergency Medicine

## 2016-05-15 ENCOUNTER — Telehealth (HOSPITAL_BASED_OUTPATIENT_CLINIC_OR_DEPARTMENT_OTHER): Payer: Self-pay

## 2016-05-15 ENCOUNTER — Emergency Department
Admission: EM | Admit: 2016-05-15 | Discharge: 2016-05-15 | Disposition: A | Discharge status: Home / Self Care | Payer: Medicaid Other | Attending: Emergency Medicine | Admitting: Emergency Medicine

## 2016-05-15 DIAGNOSIS — Z87891 Personal history of nicotine dependence: Secondary | ICD-10-CM | POA: Diagnosis not present

## 2016-05-15 DIAGNOSIS — O219 Vomiting of pregnancy, unspecified: Secondary | ICD-10-CM | POA: Diagnosis present

## 2016-05-15 DIAGNOSIS — Z3A08 8 weeks gestation of pregnancy: Secondary | ICD-10-CM | POA: Insufficient documentation

## 2016-05-15 DIAGNOSIS — R112 Nausea with vomiting, unspecified: Secondary | ICD-10-CM

## 2016-05-15 DIAGNOSIS — Z3491 Encounter for supervision of normal pregnancy, unspecified, first trimester: Secondary | ICD-10-CM

## 2016-05-15 DIAGNOSIS — R197 Diarrhea, unspecified: Secondary | ICD-10-CM | POA: Insufficient documentation

## 2016-05-15 LAB — CBC
HCT: 36.8 % (ref 35.0–47.0)
Hemoglobin: 12.6 g/dL (ref 12.0–16.0)
MCH: 27.3 pg (ref 26.0–34.0)
MCHC: 34.2 g/dL (ref 32.0–36.0)
MCV: 79.7 fL — ABNORMAL LOW (ref 80.0–100.0)
Platelets: 209 10*3/uL (ref 150–440)
RBC: 4.61 MIL/uL (ref 3.80–5.20)
RDW: 16.3 % — ABNORMAL HIGH (ref 11.5–14.5)
WBC: 8 10*3/uL (ref 3.6–11.0)

## 2016-05-15 LAB — URINALYSIS COMPLETE WITH MICROSCOPIC (ARMC ONLY)
Bilirubin Urine: NEGATIVE
Glucose, UA: NEGATIVE mg/dL
Hgb urine dipstick: NEGATIVE
Ketones, ur: NEGATIVE mg/dL
Nitrite: NEGATIVE
Protein, ur: 30 mg/dL — AB
Specific Gravity, Urine: 1.024 (ref 1.005–1.030)
pH: 5 (ref 5.0–8.0)

## 2016-05-15 LAB — COMPREHENSIVE METABOLIC PANEL
ALT: 15 U/L (ref 14–54)
AST: 17 U/L (ref 15–41)
Albumin: 4.4 g/dL (ref 3.5–5.0)
Alkaline Phosphatase: 56 U/L (ref 38–126)
Anion gap: 7 (ref 5–15)
BUN: 9 mg/dL (ref 6–20)
CO2: 22 mmol/L (ref 22–32)
Calcium: 9.5 mg/dL (ref 8.9–10.3)
Chloride: 106 mmol/L (ref 101–111)
Creatinine, Ser: 0.62 mg/dL (ref 0.44–1.00)
GFR calc Af Amer: >60 mL/min (ref >60)
GFR calc non Af Amer: >60 mL/min (ref >60)
Glucose, Bld: 86 mg/dL (ref 65–99)
Potassium: 3.7 mmol/L (ref 3.5–5.1)
Sodium: 135 mmol/L (ref 135–145)
Total Bilirubin: 0.6 mg/dL (ref 0.3–1.2)
Total Protein: 8.1 g/dL (ref 6.5–8.1)

## 2016-05-15 LAB — LIPASE, BLOOD: Lipase: 27 U/L (ref 11–51)

## 2016-05-15 LAB — HCG, QUANTITATIVE, PREGNANCY: hCG, Beta Chain, Quant, S: 151873 m[IU]/mL — ABNORMAL HIGH (ref <5)

## 2016-05-15 MED ORDER — NITROFURANTOIN MONOHYD MACRO 100 MG PO CAPS: 100.0000 mg | ORAL_CAPSULE | Freq: Two times a day (BID) | ORAL | 0 refills | Status: DC

## 2016-05-15 MED ORDER — LOPERAMIDE HCL 2 MG PO CAPS
4.0000 mg | ORAL_CAPSULE | Freq: Once | ORAL | Status: AC
  Administered 2016-05-15: 4 mg via ORAL
  Filled 2016-05-15: qty 2

## 2016-05-15 MED ORDER — PROMETHAZINE HCL 25 MG PO TABS: 25.0000 mg | ORAL_TABLET | ORAL | 1 refills | Status: DC | PRN

## 2016-05-15 MED ORDER — PROMETHAZINE HCL 25 MG/ML IJ SOLN
25.0000 mg | Freq: Once | INTRAMUSCULAR | Status: AC
  Administered 2016-05-15: 25 mg via INTRAVENOUS
  Filled 2016-05-15: qty 1

## 2016-05-15 MED ORDER — ONDANSETRON 4 MG PO TBDP: 4.0000 mg | ORAL_TABLET | Freq: Three times a day (TID) | ORAL | 1 refills | Status: DC | PRN

## 2016-05-15 MED ORDER — SODIUM CHLORIDE 0.9 % IV SOLN
Freq: Once | INTRAVENOUS | Status: AC
  Administered 2016-05-15: 16:00:00 via INTRAVENOUS

## 2016-05-15 MED ORDER — ONDANSETRON HCL 4 MG/2ML IJ SOLN
4.0000 mg | Freq: Once | INTRAMUSCULAR | Status: AC
  Administered 2016-05-15: 4 mg via INTRAVENOUS
  Filled 2016-05-15: qty 2

## 2016-05-15 NOTE — Telephone Encounter (Signed)
Post ED Visit - Positive Culture Follow-up  Culture report reviewed by antimicrobial stewardship pharmacist:  []  Enzo BiNathan Batchelder, Pharm.D. []  Celedonio MiyamotoJeremy Frens, Pharm.D., BCPS []  Garvin FilaMike Maccia, Pharm.D. []  Georgina PillionElizabeth Martin, Pharm.D., BCPS []  HopewellMinh Pham, [address]Ph[address]D., BCPS, AAHIVP []  Estella HuskMichelle Turner, Pharm.D., BCPS, AAHIVP []  Tennis Mustassie Stewart, Pharm.D. []  Sherle Poeob Vincent, [address]Ph[address] Delle Reiningorey Ball Pharm D Positive urine culture Treated with Cephalexin, organism sensitive to the same and no further patient follow-up is required at this time.  Jerry CarasCullom, Shamona Wirtz Burnett 05/15/2016, 9:49 AM

## 2016-05-15 NOTE — ED Notes (Signed)
Patient unable to provide stool sample at this time.

## 2016-05-15 NOTE — ED Triage Notes (Addendum)
Pt reports being [redacted] weeks pregnant states that her nausea has gotten worse along with vomiting and diarrhea that started about 3 weeks. Ago. C/O lower abdomen cramping no bleeding.

## 2016-05-15 NOTE — ED Notes (Signed)
Patient c/o vomiting and diarrhea for the past 3 weeks on and off. Patient recently found out that she is [redacted] weeks pregnant.

## 2016-05-15 NOTE — ED Notes (Signed)
Patient unable to have bowel movement at ED.

## 2016-05-15 NOTE — ED Provider Notes (Signed)
[facility] Emergency Department Provider Note        Time seen: ----------------------------------------- 3:25 PM on 05/15/2016 -----------------------------------------    I have reviewed the triage vital signs and the nursing notes.   HISTORY  Chief Complaint Emesis and Diarrhea    HPI Erika Solomon is a 20 y.o. female who presents to ER for intractable vomiting and diarrhea. Patient states she's had intermittent vomiting and diarrhea for the last 3 weeks. Currently she is [redacted] weeks pregnant, was seen 2 days ago at a separate hospital for pain and bleeding. She states ultrasound that time was normal. She states this is her third pregnancy, she said two normal full-term deliveries. She has never had persistent vomiting like this before associated with diarrhea. She states the bleeding has stopped, she denies fevers or other complaints.   Past Medical History:  Diagnosis Date  . Anxiety and depression   . Pregnant     Patient Active Problem List   Diagnosis Date Noted  . Pregnancy 05/21/2015  . Indication for care in labor or delivery 05/21/2015  . Mild preeclampsia 05/21/2015  . Labor and delivery indication for care or intervention 04/24/2015  . Variable fetal heart rate decelerations, antepartum 04/23/2015  . Fetal heart rate decelerations affecting management of mother 04/23/2015  . Nausea/vomiting in pregnancy 02/13/2015  . Pregnant and not yet delivered 02/12/2015  . Nausea and vomiting in pregnancy 02/12/2015    Past Surgical History:  Procedure Laterality Date  . No past surgery      Allergies Albuterol; Benadryl [diphenhydramine]; Buspar [buspirone]; and Strawberry extract  Social History Social History  Substance Use Topics  . Smoking status: Former Games developer  . Smokeless tobacco: Former Neurosurgeon  . Alcohol use 0.0 oz/week     Comment: occasional    Review of Systems Constitutional: Negative for fever. Cardiovascular: Negative  for chest pain. Respiratory: Negative for shortness of breath. Gastrointestinal: Positive for abdominal pain, vomiting and diarrhea Genitourinary: Negative for dysuria. Musculoskeletal: Negative for back pain. Skin: Negative for rash. Neurological: Negative for headaches, focal weakness or numbness.  10-point ROS otherwise negative.  ____________________________________________   PHYSICAL EXAM:  VITAL SIGNS: ED Triage Vitals  Enc Vitals Group     BP 05/15/16 1449 127/62     Pulse Rate 05/15/16 1449 67     Resp 05/15/16 1449 20     Temp 05/15/16 1449 98 F (36.7 C)     Temp src --      SpO2 05/15/16 1449 96 %     Weight 05/15/16 1452 183 lb (83 kg)     Height 05/15/16 1452  (1.6 m)     Head Circumference --      Peak Flow --      Pain Score 05/15/16 1452 5     Pain Loc --      Pain Edu? --      Excl. in GC? --     Constitutional: Alert and oriented. Well appearing and in no distress. Eyes: Conjunctivae are normal. PERRL. Normal extraocular movements. ENT   Head: Normocephalic and atraumatic.   Nose: No congestion/rhinnorhea.   Mouth/Throat: Mucous membranes are moist.   Neck: No stridor. Cardiovascular: Normal rate, regular rhythm. No murmurs, rubs, or gallops. Respiratory: Normal respiratory effort without tachypnea nor retractions. Breath sounds are clear and equal bilaterally. No wheezes/rales/rhonchi. Gastrointestinal: Soft and nontender. Normal bowel sounds Musculoskeletal: Nontender with normal range of motion in all extremities. No lower extremity tenderness nor edema. Neurologic:  Normal speech and language. No gross focal neurologic deficits are appreciated.  Skin:  Skin is warm, dry and intact. No rash noted. Psychiatric: Mood and affect are normal. Speech and behavior are normal.  ___________________________________________  ED COURSE:  Pertinent labs & imaging results that were available during my care of the patient were reviewed by me  and considered in my medical decision making (see chart for details). Clinical Course  Patient is in no acute distress, we will give IV fluids, check stool studies if possible. She will also receive IV Zofran and morphine.  Procedures ____________________________________________   LABS (pertinent positives/negatives)  Labs Reviewed  CBC - Abnormal; Notable for the following:       Result Value   MCV 79.7 (*)    RDW 16.3 (*)    All other components within normal limits  URINALYSIS COMPLETEWITH MICROSCOPIC ([facility] ONLY) - Abnormal; Notable for the following:    Color, Urine YELLOW (*)    APPearance HAZY (*)    Protein, ur 30 (*)    Leukocytes, UA 2+ (*)    Bacteria, UA RARE (*)    Squamous Epithelial / LPF 6-30 (*)    All other components within normal limits  HCG, QUANTITATIVE, PREGNANCY - Abnormal; Notable for the following:    hCG, Beta Chain, Quant, S [id number] (*)    All other components within normal limits  C DIFFICILE QUICK SCREEN W PCR REFLEX  GASTROINTESTINAL PANEL BY PCR, STOOL (REPLACES STOOL CULTURE)  LIPASE, BLOOD  COMPREHENSIVE METABOLIC PANEL   ____________________________________________  FINAL ASSESSMENT AND PLAN  Vomiting and diarrhea, first trimester pregnancy, Cystitis  Plan: Patient with labs and imaging as dictated above. Patient is in no acute distress, currently feeling better. She has not had vomiting or diarrhea while she has been in the ER. We attempted to obtain a stool sample. Patient was unable to provide this. She'll be discharged with Zofran to take as needed, will encourage Imodium. Unclear etiology for her symptoms. Labs are concerning for UTI. She will receive Macrobid.   Emily FilbertWilliams, Antonietta Lansdowne E, MD   Note: This dictation was prepared with Dragon dictation. Any transcriptional errors that result from this process are unintentional    Emily FilbertJonathan E Shakemia Madera, MD 05/15/16 [phone]

## 2016-05-15 NOTE — ED Notes (Signed)
Patient unable to give stool sample at this time

## 2016-05-18 ENCOUNTER — Ambulatory Visit (INDEPENDENT_AMBULATORY_CARE_PROVIDER_SITE_OTHER): Payer: Medicaid Other | Admitting: Obstetrics and Gynecology

## 2016-05-18 ENCOUNTER — Encounter: Payer: Self-pay | Admitting: Obstetrics and Gynecology

## 2016-05-18 ENCOUNTER — Other Ambulatory Visit: Payer: Self-pay | Admitting: Obstetrics and Gynecology

## 2016-05-18 VITALS — BP 104/64 | HR 86 | Ht 63.0 in | Wt 182.4 lb

## 2016-05-18 DIAGNOSIS — O209 Hemorrhage in early pregnancy, unspecified: Secondary | ICD-10-CM

## 2016-05-18 DIAGNOSIS — N760 Acute vaginitis: Secondary | ICD-10-CM | POA: Diagnosis not present

## 2016-05-18 DIAGNOSIS — O4691 Antepartum hemorrhage, unspecified, first trimester: Secondary | ICD-10-CM | POA: Diagnosis not present

## 2016-05-18 DIAGNOSIS — A499 Bacterial infection, unspecified: Secondary | ICD-10-CM

## 2016-05-18 DIAGNOSIS — O2341 Unspecified infection of urinary tract in pregnancy, first trimester: Secondary | ICD-10-CM

## 2016-05-18 DIAGNOSIS — R109 Unspecified abdominal pain: Secondary | ICD-10-CM

## 2016-05-18 DIAGNOSIS — B9689 Other specified bacterial agents as the cause of diseases classified elsewhere: Secondary | ICD-10-CM

## 2016-05-18 DIAGNOSIS — O9989 Other specified diseases and conditions complicating pregnancy, childbirth and the puerperium: Secondary | ICD-10-CM

## 2016-05-18 DIAGNOSIS — O219 Vomiting of pregnancy, unspecified: Secondary | ICD-10-CM | POA: Diagnosis not present

## 2016-05-18 DIAGNOSIS — Z3491 Encounter for supervision of normal pregnancy, unspecified, first trimester: Secondary | ICD-10-CM | POA: Diagnosis not present

## 2016-05-18 DIAGNOSIS — O26899 Other specified pregnancy related conditions, unspecified trimester: Secondary | ICD-10-CM

## 2016-05-18 DIAGNOSIS — Z3490 Encounter for supervision of normal pregnancy, unspecified, unspecified trimester: Secondary | ICD-10-CM

## 2016-05-18 MED ORDER — CITRANATAL B-CALM 20-1 MG & 2 X 25 MG PO MISC: 1.0000 | Freq: Every day | ORAL | 8 refills | Status: DC

## 2016-05-18 NOTE — Progress Notes (Signed)
GYNECOLOGY CLINIC PROGRESS NOTE  Subjective:     Alois ClicheDonna M Blecha is a 20 y.o. [id number] female  who presents as a follow up from the Emergency Room for evaluation of nausea and vomiting in pregnancy. Onset of symptoms was a week ago. Patient presented to the Emergency Room 3 days ago with complaints of nausea/vomiting, and diarrhea for 3 days. Patient describes nausea as moderate.  Today notes that diarrhea has resolved, and nausea/vomiting is being managed with Phenergan and Zofran.  States that she has tried to stop the Phenergan due to drowsiness, but the Zofran does completely control her symptoms alone. Symptoms have slowly been improving.   Of note, patient had also been seen at another hospital [facility]([place]) several days prior to her presentation [facility] ED due to complaints of pain and bleeding.  Was told that they could see "something" but not sure if it was the baby several days ago. Notes that the bleeding has resolved, but still noting abdominal pain.  Is also currently taking Keflex for a UTI diagnosed in [facility] ED. Patient's last menstrual period was 03/15/2016.  Currently at 9.[redacted] weeks gestation, with EDD 12/20/2016.   OB History  Gravida Para Term Preterm AB Living  3 2 2  0 0 2  SAB TAB Ectopic Multiple Live Births  0 0 0 0 2    # Outcome Date GA Lbr Len/2nd Weight Sex Delivery Anes PTL Lv  3 Current           2 Term 05/22/15 [id number]  9 lb 3.1 oz (4.17 kg) M Vag-Spont None  LIV     Complications: Pre-eclampsia  1 Term 06/19/14 [id number]  7 lb 1 oz (3.204 kg) M Vag-Spont  N LIV    Obstetric Comments  Induced at 39 weeks in second pregnancy for pre-eclampsia.     Past Medical History:  Diagnosis Date  . Anxiety and depression   . Pregnant     Family History  Problem Relation Age of Onset  . Diabetes Maternal Grandfather   . Hypertension Father   . Hypertension Maternal Grandmother     Past Surgical History:  Procedure Laterality Date  . No past surgery      Social  History   Social History  . Marital status: Single    Spouse name: N/A  . Number of children: N/A  . Years of education: N/A   Occupational History  . Not on file.   Social History Main Topics  . Smoking status: Former Games developermoker  . Smokeless tobacco: Former NeurosurgeonUser  . Alcohol use No     Comment: occasional  . Drug use: No  . Sexual activity: Yes    Birth control/ protection: None   Other Topics Concern  . Not on file   Social History Narrative  . No narrative on file    Current Outpatient Prescriptions on File Prior to Visit  Medication Sig Dispense Refill  . ondansetron (ZOFRAN ODT) 4 MG disintegrating tablet Take 1 tablet (4 mg total) by mouth every 8 (eight) hours as needed for nausea or vomiting. 20 tablet 1  . promethazine (PHENERGAN) 25 MG tablet Take 1 tablet (25 mg total) by mouth every 4 (four) hours as needed for nausea or vomiting. 30 tablet 1  . triamcinolone cream (KENALOG) 0.1 % Apply 1 application topically 4 (four) times daily. 30 g 0  . Prenatal Vit-Fe Fumarate-FA (PRENATAL MULTIVITAMIN) TABS tablet Take 1 tablet by mouth daily.  No current facility-administered medications on file prior to visit.     Allergies  Allergen Reactions  . Albuterol Anaphylaxis  . Benadryl [Diphenhydramine] Anaphylaxis  . Buspar [Buspirone] Anaphylaxis  . Strawberry Extract Anaphylaxis     Review of Systems Pertinent items noted in HPI and remainder of comprehensive ROS otherwise negative.   Objective:    BP 104/64   Pulse 86   Ht 5\' 3"  (1.6 m)   Wt 182 lb 6.4 oz (82.7 kg)   LMP 03/15/2016   BMI 32.31 kg/m  Neck: no adenopathy, no carotid bruit, no JVD, supple, symmetrical, trachea midline and thyroid not enlarged, symmetric, no tenderness/mass/nodules Back: symmetric, no curvature. ROM normal. No CVA tenderness. Lungs: clear to auscultation bilaterally Heart: regular rate and rhythm, S1, S2 normal, no murmur, click, rub or gallop Abdomen: soft, non-tender; bowel  sounds normal; no masses,  no organomegaly Pelvic: deferred Extremities: extremities normal, atraumatic, no cyanosis or edema     Assessment:   Nausea and vomiting of pregnancy Pregnancy, unsure viability Abdominal pain Bacterial vaginosis UTI in pregnancy  Plan:    Antiemetics per medication orders. Dietary guidelines discussed (BRAT diet). To have ultrasound scheduled within next several days to confirm viability.  Follow up in 1 week if not improving. Will also have NOB intake at that time.  Reviewed ER visit notes and labs.  Patient also with BV infection, did not receive antibiotics.  Will prescribe Flagyl, patient to begin taking once nausea/vomiting improves.  Advised on Tylenol prn for abdominal pain.  Will also improve once nausea/vomiting subsides. To start PNV, samples given, once tolerating.   Hildred LaserAnika Clevon Khader, MD [facility]

## 2016-05-19 ENCOUNTER — Other Ambulatory Visit: Payer: Medicaid Other

## 2016-05-20 ENCOUNTER — Ambulatory Visit (INDEPENDENT_AMBULATORY_CARE_PROVIDER_SITE_OTHER): Payer: Medicaid Other

## 2016-05-20 ENCOUNTER — Other Ambulatory Visit: Payer: Self-pay | Admitting: Obstetrics and Gynecology

## 2016-05-20 DIAGNOSIS — O209 Hemorrhage in early pregnancy, unspecified: Secondary | ICD-10-CM

## 2016-05-20 DIAGNOSIS — O4691 Antepartum hemorrhage, unspecified, first trimester: Secondary | ICD-10-CM

## 2016-05-22 ENCOUNTER — Emergency Department
Admission: EM | Admit: 2016-05-22 | Discharge: 2016-05-22 | Disposition: A | Discharge status: Home / Self Care | Payer: Medicaid Other | Attending: Emergency Medicine | Admitting: Emergency Medicine

## 2016-05-22 ENCOUNTER — Encounter: Payer: Self-pay | Admitting: Emergency Medicine

## 2016-05-22 DIAGNOSIS — X500XXA Overexertion from strenuous movement or load, initial encounter: Secondary | ICD-10-CM | POA: Insufficient documentation

## 2016-05-22 DIAGNOSIS — S39012A Strain of muscle, fascia and tendon of lower back, initial encounter: Secondary | ICD-10-CM | POA: Diagnosis not present

## 2016-05-22 DIAGNOSIS — Y929 Unspecified place or not applicable: Secondary | ICD-10-CM | POA: Diagnosis not present

## 2016-05-22 DIAGNOSIS — O9A211 Injury, poisoning and certain other consequences of external causes complicating pregnancy, first trimester: Secondary | ICD-10-CM | POA: Diagnosis not present

## 2016-05-22 DIAGNOSIS — Y990 Civilian activity done for income or pay: Secondary | ICD-10-CM | POA: Diagnosis not present

## 2016-05-22 DIAGNOSIS — Z87891 Personal history of nicotine dependence: Secondary | ICD-10-CM | POA: Diagnosis not present

## 2016-05-22 DIAGNOSIS — Z3A10 10 weeks gestation of pregnancy: Secondary | ICD-10-CM | POA: Insufficient documentation

## 2016-05-22 DIAGNOSIS — Y9389 Activity, other specified: Secondary | ICD-10-CM | POA: Diagnosis not present

## 2016-05-22 NOTE — ED Triage Notes (Signed)
Low back and L buttock and down leg pain since yesterday. States does a lot of lifting at work. States is [redacted] weeks pregnant but denies problems related to pregnancy.

## 2016-05-22 NOTE — ED Provider Notes (Signed)
[facility] Emergency Department Provider Note  ____________________________________________  Time seen: Approximately 5:57 PM  I have reviewed the triage vital signs and the nursing notes.   HISTORY  Chief Complaint Back Pain    HPI Erika Solomon is a 20 y.o. female who presents for evaluation of low back and left buttock pain. Patient states the pain radiates down her posterior aspect of her left leg. She reports that she does a lot of lifting at work and she is [redacted] weeks pregnant. Any bleeding or discharge. Has a urinary frequency.   Past Medical History:  Diagnosis Date  . Anxiety and depression   . Pregnant     Patient Active Problem List   Diagnosis Date Noted  . Pregnancy 05/21/2015  . Indication for care in labor or delivery 05/21/2015  . Mild preeclampsia 05/21/2015  . Labor and delivery indication for care or intervention 04/24/2015  . Variable fetal heart rate decelerations, antepartum 04/23/2015  . Fetal heart rate decelerations affecting management of mother 04/23/2015  . Nausea/vomiting in pregnancy 02/13/2015  . Pregnant and not yet delivered 02/12/2015  . Nausea and vomiting in pregnancy 02/12/2015    Past Surgical History:  Procedure Laterality Date  . No past surgery      Prior to Admission medications   Medication Sig Start Date End Date Taking? Authorizing Provider  ondansetron (ZOFRAN ODT) 4 MG disintegrating tablet Take 1 tablet (4 mg total) by mouth every 8 (eight) hours as needed for nausea or vomiting. 05/15/16   Emily FilbertJonathan E Williams, MD  Prenat w/o A FeCbnFeGlu-FA &B6 (CITRANATAL B-CALM) 20-1 & 25 (2) MG MISC Take 1 tablet by mouth daily. 05/18/16   Hildred LaserAnika Cherry, MD  Prenatal Vit-Fe Fumarate-FA (PRENATAL MULTIVITAMIN) TABS tablet Take 1 tablet by mouth daily.     Historical Provider, MD  promethazine (PHENERGAN) 25 MG tablet Take 1 tablet (25 mg total) by mouth every 4 (four) hours as needed for nausea or vomiting.  05/15/16   Emily FilbertJonathan E Williams, MD  triamcinolone cream (KENALOG) 0.1 % Apply 1 application topically 4 (four) times daily. 04/28/16   Delorise RoyalsJonathan D Cuthriell, PA-C    Allergies Albuterol; Benadryl [diphenhydramine]; Buspar [buspirone]; and Strawberry extract  Family History  Problem Relation Age of Onset  . Diabetes Maternal Grandfather   . Hypertension Father   . Hypertension Maternal Grandmother     Social History Social History  Substance Use Topics  . Smoking status: Former Games developermoker  . Smokeless tobacco: Former NeurosurgeonUser  . Alcohol use No     Comment: occasional    Review of Systems Constitutional: No fever/chills Cardiovascular: Denies chest pain. Respiratory: Denies shortness of breath. Gastrointestinal: No abdominal pain.  No nausea, no vomiting.  No diarrhea.  No constipation. Genitourinary: Negative for dysuria. Musculoskeletal: Positive for low back pain. Skin: Negative for rash. Neurological: Negative for headaches, focal weakness or numbness.  10-point ROS otherwise negative.  ____________________________________________   PHYSICAL EXAM:  VITAL SIGNS: ED Triage Vitals  Enc Vitals Group     BP 05/22/16 1742 120/77     Pulse Rate 05/22/16 1742 81     Resp 05/22/16 1742 18     Temp 05/22/16 1742 98.1 F (36.7 C)     Temp Source 05/22/16 1742 Oral     SpO2 05/22/16 1742 99 %     Weight 05/22/16 1743 180 lb (81.6 kg)     Height 05/22/16 1743 5\' 3"  (1.6 m)     Head Circumference --  Peak Flow --      Pain Score 05/22/16 1743 5     Pain Loc --      Pain Edu? --      Excl. in GC? --     Constitutional: Alert and oriented. Well appearing and in no acute distress. Cardiovascular: Normal rate, regular rhythm. Grossly normal heart sounds.  Good peripheral circulation. Respiratory: Normal respiratory effort.  No retractions. Lungs CTAB. Gastrointestinal: Soft and nontender. No distention.  No CVA tenderness. Musculoskeletal: No lower extremity tenderness nor  edema.  No joint effusions. Bilateral straight leg raise unremarkable. Neurologic:  Normal speech and language. No gross focal neurologic deficits are appreciated. No gait instability. Skin:  Skin is warm, dry and intact. No rash noted. Psychiatric: Mood and affect are normal. Speech and behavior are normal.  ____________________________________________   LABS (all labs ordered are listed, but only abnormal results are displayed)  Labs Reviewed - No data to display ____________________________________________  EKG   ____________________________________________  RADIOLOGY   ____________________________________________   PROCEDURES  Procedure(s) performed: None  Critical Care performed: No  ____________________________________________   INITIAL IMPRESSION / ASSESSMENT AND PLAN / ED COURSE  Pertinent labs & imaging results that were available during my care of the patient were reviewed by me and considered in my medical decision making (see chart for details). Review of the [place] CSRS was performed in accordance of the [facility] prior to dispensing any controlled drugs.  Acute lumbosacral strain secondary to lifting. Encourage patient to use a heating compresses. Tylenol as needed for pain or discomfort and follow up with PCP. Work excuse 24 hours given  Clinical Course    ____________________________________________   FINAL CLINICAL IMPRESSION(S) / ED DIAGNOSES  Final diagnoses:  Lumbar strain, initial encounter     This chart was dictated using voice recognition software/Dragon. Despite best efforts to proofread, errors can occur which can change the meaning. Any change was purely unintentional.    Evangeline Dakinharles M Girl Schissler, PA-C 05/22/16 1813    Jene Everyobert Kinner, MD 05/22/16 [phone]

## 2016-05-24 DIAGNOSIS — O2340 Unspecified infection of urinary tract in pregnancy, unspecified trimester: Secondary | ICD-10-CM | POA: Insufficient documentation

## 2016-05-25 ENCOUNTER — Ambulatory Visit: Payer: Medicaid Other

## 2016-05-25 VITALS — BP 99/64 | HR 79 | Wt 181.2 lb

## 2016-05-25 DIAGNOSIS — Z3490 Encounter for supervision of normal pregnancy, unspecified, unspecified trimester: Secondary | ICD-10-CM

## 2016-05-25 NOTE — Progress Notes (Signed)
Erika Solomon presents for NOB nurse interview visit. G-3.  P-2004. As I was getting pt information she stated she already had a pap smear, breast exam at [facility].  She had went to ER for vaginal bleeding and nausea/vomiting. Could not get them on the phone, no answer and they were closed for 3 days. Went then to [facility] urgent care and they told her to come here. During her visits here it was unknown that she already had started her prenatal care elsewhere. She did not have her blood work done because the lab person was not there that day. Has appt on 06/02/16 for ultrasound and is to get lab work. I did inform pt that she was welcome to see [facility] for her OB care but we need to see one or the other for continuity of care and besides her insurance would be really confused and may not pay for duplicate care.  Pt opted to return to [facility]. Pt states "if they don't get there act together she may transfer".  I gave her copy of her ultrasound done here so they would know what we had done. No blood work done due to her already having an appt scheduled there.

## 2016-06-24 LAB — OB RESULTS CONSOLE HIV ANTIBODY (ROUTINE TESTING): HIV: NONREACTIVE

## 2016-06-24 LAB — OB RESULTS CONSOLE VARICELLA ZOSTER ANTIBODY, IGG: Varicella: NON-IMMUNE/NOT IMMUNE

## 2016-06-24 LAB — OB RESULTS CONSOLE RUBELLA ANTIBODY, IGM: Rubella: NON-IMMUNE/NOT IMMUNE

## 2016-06-30 ENCOUNTER — Inpatient Hospital Stay (HOSPITAL_COMMUNITY)
Admission: AD | Admit: 2016-06-30 | Discharge: 2016-06-30 | Disposition: A | Discharge status: Home / Self Care | Payer: Medicaid Other | Source: Ambulatory Visit | Attending: Family Medicine | Admitting: Family Medicine

## 2016-06-30 ENCOUNTER — Encounter (HOSPITAL_COMMUNITY): Payer: Self-pay | Admitting: *Deleted

## 2016-06-30 DIAGNOSIS — O219 Vomiting of pregnancy, unspecified: Secondary | ICD-10-CM

## 2016-06-30 DIAGNOSIS — R109 Unspecified abdominal pain: Secondary | ICD-10-CM | POA: Diagnosis present

## 2016-06-30 DIAGNOSIS — Z3A15 15 weeks gestation of pregnancy: Secondary | ICD-10-CM | POA: Diagnosis not present

## 2016-06-30 DIAGNOSIS — O210 Mild hyperemesis gravidarum: Secondary | ICD-10-CM | POA: Diagnosis not present

## 2016-06-30 HISTORY — DX: Supervision of pregnancy with other poor reproductive or obstetric history, unspecified trimester: O09.299

## 2016-06-30 LAB — URINE MICROSCOPIC-ADD ON: RBC / HPF: NONE SEEN RBC/hpf (ref 0–5)

## 2016-06-30 LAB — COMPREHENSIVE METABOLIC PANEL
ALT: 9 U/L — ABNORMAL LOW (ref 14–54)
AST: 12 U/L — ABNORMAL LOW (ref 15–41)
Albumin: 3.4 g/dL — ABNORMAL LOW (ref 3.5–5.0)
Alkaline Phosphatase: 44 U/L (ref 38–126)
Anion gap: 6 (ref 5–15)
BUN: 9 mg/dL (ref 6–20)
CO2: 22 mmol/L (ref 22–32)
Calcium: 8.6 mg/dL — ABNORMAL LOW (ref 8.9–10.3)
Chloride: 104 mmol/L (ref 101–111)
Creatinine, Ser: 0.5 mg/dL (ref 0.44–1.00)
GFR calc Af Amer: >60 mL/min (ref >60)
GFR calc non Af Amer: >60 mL/min (ref >60)
Glucose, Bld: 87 mg/dL (ref 65–99)
Potassium: 3.5 mmol/L (ref 3.5–5.1)
Sodium: 132 mmol/L — ABNORMAL LOW (ref 135–145)
Total Bilirubin: 0.5 mg/dL (ref 0.3–1.2)
Total Protein: 6.9 g/dL (ref 6.5–8.1)

## 2016-06-30 LAB — URINALYSIS, ROUTINE W REFLEX MICROSCOPIC
Bilirubin Urine: NEGATIVE
Glucose, UA: NEGATIVE mg/dL
Hgb urine dipstick: NEGATIVE
Ketones, ur: 15 mg/dL — AB
Nitrite: NEGATIVE
Protein, ur: 30 mg/dL — AB
Specific Gravity, Urine: 1.02 (ref 1.005–1.030)
pH: 6.5 (ref 5.0–8.0)

## 2016-06-30 LAB — CBC
HCT: 29.6 % — ABNORMAL LOW (ref 36.0–46.0)
Hemoglobin: 10.2 g/dL — ABNORMAL LOW (ref 12.0–15.0)
MCH: 27.9 pg (ref 26.0–34.0)
MCHC: 34.5 g/dL (ref 30.0–36.0)
MCV: 81.1 fL (ref 78.0–100.0)
Platelets: 193 10*3/uL (ref 150–400)
RBC: 3.65 MIL/uL — ABNORMAL LOW (ref 3.87–5.11)
RDW: 15.7 % — ABNORMAL HIGH (ref 11.5–15.5)
WBC: 7.4 10*3/uL (ref 4.0–10.5)

## 2016-06-30 MED ORDER — LACTATED RINGERS IV BOLUS (SEPSIS)
1000.0000 mL | Freq: Once | INTRAVENOUS | Status: AC
  Administered 2016-06-30: 1000 mL via INTRAVENOUS

## 2016-06-30 MED ORDER — FAMOTIDINE IN NACL 20-0.9 MG/50ML-% IV SOLN
20.0000 mg | Freq: Once | INTRAVENOUS | Status: AC
  Administered 2016-06-30: 20 mg via INTRAVENOUS
  Filled 2016-06-30: qty 50

## 2016-06-30 MED ORDER — PROMETHAZINE HCL 25 MG/ML IJ SOLN
25.0000 mg | Freq: Once | INTRAMUSCULAR | Status: AC
  Administered 2016-06-30: 25 mg via INTRAVENOUS
  Filled 2016-06-30: qty 1

## 2016-06-30 NOTE — MAU Note (Signed)
Stomach hurts really bad, from cramping. Has been having vomiting and diarrhea; off and on since beginning of preg.  Was dx with e-coli.  Was given an antibiotic, but was unable to keep them down.

## 2016-06-30 NOTE — Discharge Instructions (Signed)
Eating Plan for Pregnancy Related Nausea/Vomiting Severe cases of hyperemesis gravidarum can lead to dehydration and malnutrition. The hyperemesis eating plan is one way to lessen the symptoms of nausea and vomiting. It is often used with prescribed medicines to control your symptoms.  WHAT CAN I DO TO RELIEVE MY SYMPTOMS? Listen to your body. Everyone is different and has different preferences. Find what works best for you. Some of the following things may help:  Eat and drink slowly.  Eat 5-6 small meals daily instead of 3 large meals.   Eat crackers before you get out of bed in the morning.   Starchy foods are usually well tolerated (such as cereal, toast, bread, potatoes, pasta, rice, and pretzels).   Ginger may help with nausea. Add  tsp ground ginger to hot tea or choose ginger tea.   Try drinking 100% fruit juice or an electrolyte drink.  Continue to take your prenatal vitamins as directed by your health care provider. If you are having trouble taking your prenatal vitamins, talk with your health care provider about different options.  Include at least 1 serving of protein with your meals and snacks (such as meats or poultry, beans, nuts, eggs, or yogurt). Try eating a protein-rich snack before bed (such as cheese and crackers or a half [place] or peanut butter sandwich). WHAT THINGS SHOULD I AVOID TO REDUCE MY SYMPTOMS? The following things may help reduce your symptoms:  Avoid foods with strong smells. Try eating meals in well-ventilated areas that are free of odors.  Avoid drinking water or other beverages with meals. Try not to drink anything less than 30 minutes before and after meals.  Avoid drinking more than 1 cup of fluid at a time.  Avoid fried or high-fat foods, such as butter and cream sauces.  Avoid spicy foods.  Avoid  skipping meals the best you can. Nausea can be more intense on an empty stomach. If you cannot tolerate food at that time, do not force it. Try sucking on ice chips or other frozen items and make up the calories later.  Avoid lying down within 2 hours after eating.   This information is not intended to replace advice given to you by your health care provider. Make sure you discuss any questions you have with your health care provider.   Document Released: 07/18/2007 Document Revised: 09/25/2013 Document Reviewed: 07/25/2013 Elsevier Interactive Patient Education 2016 Elsevier Inc.      Morning Sickness Morning sickness is when you feel sick to your stomach (nauseous) during pregnancy. This nauseous feeling may or may not come with vomiting. It often occurs in the morning but can be a problem any time of day. Morning sickness is most common during the first trimester, but it may continue throughout pregnancy. While morning sickness is unpleasant, it is usually harmless unless you develop severe and continual vomiting (hyperemesis gravidarum). This condition requires more intense treatment.  CAUSES  The cause of morning sickness is not completely known but seems to be related to normal hormonal changes that occur in pregnancy. RISK FACTORS You are at greater risk if you:  Experienced nausea or vomiting before your pregnancy.  Had morning sickness during a previous pregnancy.  Are pregnant with more than one baby, such as twins. TREATMENT  Do not use any medicines (prescription, over-the-counter, or herbal) for morning sickness without first talking to your health care provider. Your health care provider may prescribe or recommend:  Vitamin B6 supplements.  Anti-nausea medicines.  The herbal  medicine ginger. HOME CARE INSTRUCTIONS   Only take over-the-counter or prescription medicines as directed by your health care provider.  Taking multivitamins before getting pregnant can  prevent or decrease the severity of morning sickness in most women.  Eat a piece of dry toast or unsalted crackers before getting out of bed in the morning.  Eat five or six small meals a day.  Eat dry and bland foods (rice, baked potato). Foods high in carbohydrates are often helpful.  Do not drink liquids with your meals. Drink liquids between meals.  Avoid greasy, fatty, and spicy foods.  Get someone to cook for you if the smell of any food causes nausea and vomiting.  If you feel nauseous after taking prenatal vitamins, take the vitamins at night or with a snack.  Snack on protein foods (nuts, yogurt, cheese) between meals if you are hungry.  Eat unsweetened gelatins for desserts.  Wearing an acupressure wristband (worn for sea sickness) may be helpful.  Acupuncture may be helpful.  Do not smoke.  Get a humidifier to keep the air in your house free of odors.  Get plenty of fresh air. SEEK MEDICAL CARE IF:   Your home remedies are not working, and you need medicine.  You feel dizzy or lightheaded.  You are losing weight. SEEK IMMEDIATE MEDICAL CARE IF:   You have persistent and uncontrolled nausea and vomiting.  You pass out (faint). MAKE SURE YOU:  Understand these instructions.  Will watch your condition.  Will get help right away if you are not doing well or get worse.   This information is not intended to replace advice given to you by your health care provider. Make sure you discuss any questions you have with your health care provider.   Document Released: 11/11/2006 Document Revised: 09/25/2013 Document Reviewed: 03/07/2013 Elsevier Interactive Patient Education [employer]2016 Elsevier Inc.

## 2016-06-30 NOTE — MAU Provider Note (Signed)
History     CSN: [id number]  Arrival date and time: 06/30/16 4098   First Provider Initiated Contact with Patient 06/30/16 1140      Chief Complaint  Patient presents with  . Abdominal Cramping  . Emesis During Pregnancy   HPI  Erika Solomon is a 20 y.o. [phone] at [redacted]w[redacted]d who presents with complaints of abdominal cramping & n/v. Symptoms began since finding out pregnant. Was prescribed diclegis, zofran, & phenergan by OB ([facility] in [place]). Stopped taking diclegis last month b/c it "wasn't working". Took zofran yesterday without relief. Hasn't taken phenergan in over 2 weeks.  Vomited twice this morning and reports current nausea. States she hasn't been able to eat or drink today. Also reports diarrhea throughout pregnancy. Had 3 loose stools yesterday. Denies blood in stool or fever. No BM today.   Lower abdominal cramping worse since Sunday. Pain occurs every hout and lasts for a few minutes at a time. When pain comes, rates 5/10. Currently no pain. Denies vaginal bleeding or LOF.   OB History    Gravida Para Term Preterm AB Living   3 2 2  0 0 2   SAB TAB Ectopic Multiple Live Births   0 0 0 0 2      Obstetric Comments   Induced at 39 weeks in second pregnancy for pre-eclampsia.       Past Medical History:  Diagnosis Date  . Anxiety and depression   . Hx of preeclampsia, prior pregnancy, currently pregnant     Past Surgical History:  Procedure Laterality Date  . No past surgery      Family History  Problem Relation Age of Onset  . Diabetes Maternal Grandfather   . Hypertension Father   . Hypertension Maternal Grandmother     Social History  Substance Use Topics  . Smoking status: Never Smoker  . Smokeless tobacco: Never Used  . Alcohol use No     Comment: occasional    Allergies:  Allergies  Allergen Reactions  . Albuterol Anaphylaxis  . Benadryl [Diphenhydramine] Anaphylaxis  . Buspar [Buspirone] Anaphylaxis  . Strawberry Extract  Anaphylaxis    Prescriptions Prior to Admission  Medication Sig Dispense Refill Last Dose  . ondansetron (ZOFRAN ODT) 4 MG disintegrating tablet Take 1 tablet (4 mg total) by mouth every 8 (eight) hours as needed for nausea or vomiting. 20 tablet 1 Past Month at Unknown time  . promethazine (PHENERGAN) 25 MG tablet Take 1 tablet (25 mg total) by mouth every 4 (four) hours as needed for nausea or vomiting. 30 tablet 1 Past Month at Unknown time  . HYDROcodone-acetaminophen (NORCO/VICODIN) 5-325 MG tablet Take 1 tablet by mouth every 8 (eight) hours as needed. for pain  0   . triamcinolone cream (KENALOG) 0.1 % Apply 1 application topically 4 (four) times daily. (Patient not taking: Reported on 06/30/2016) 30 g 0 Not Taking at Unknown time    Review of Systems  Constitutional: Positive for weight loss (8# since beginning of pregnancy). Negative for chills, fever and malaise/fatigue.  Gastrointestinal: Positive for abdominal pain, diarrhea, nausea and vomiting. Negative for blood in stool, constipation, heartburn and melena.  Genitourinary: Negative.    Physical Exam   Blood pressure 120/69, pulse 86, temperature 98 F (36.7 C), temperature source Oral, resp. rate 18, weight 172 lb 6 oz (78.2 kg), last menstrual period 03/15/2016, unknown if currently breastfeeding.  Physical Exam  Nursing note and vitals reviewed. Constitutional: She is oriented to person, place, and  time. She appears well-developed and well-nourished. No distress.  HENT:  Head: Normocephalic and atraumatic.  Eyes: Conjunctivae are normal. Right eye exhibits no discharge. Left eye exhibits no discharge. No scleral icterus.  Neck: Normal range of motion.  Cardiovascular: Normal rate, regular rhythm and normal heart sounds.   No murmur heard. Respiratory: Effort normal and breath sounds normal. No respiratory distress. She has no wheezes.  GI: Soft. Bowel sounds are normal. There is no tenderness.  Genitourinary:   Genitourinary Comments: Cervix closed  Neurological: She is alert and oriented to person, place, and time.  Skin: Skin is warm and dry. She is not diaphoretic.  Psychiatric: She has a normal mood and affect. Her behavior is normal. Judgment and thought content normal.    MAU Course  Procedures Results for orders placed or performed during the hospital encounter of 06/30/16 (from the past 24 hour(s))  Urinalysis, Routine w reflex microscopic (not [facility])     Status: Abnormal   Collection Time: 06/30/16 10:15 AM  Result Value Ref Range   Color, Urine YELLOW YELLOW   APPearance HAZY (A) CLEAR   Specific Gravity, Urine 1.020 1.005 - 1.030   pH 6.5 5.0 - 8.0   Glucose, UA NEGATIVE NEGATIVE mg/dL   Hgb urine dipstick NEGATIVE NEGATIVE   Bilirubin Urine NEGATIVE NEGATIVE   Ketones, ur 15 (A) NEGATIVE mg/dL   Protein, ur 30 (A) NEGATIVE mg/dL   Nitrite NEGATIVE NEGATIVE   Leukocytes, UA MODERATE (A) NEGATIVE  Urine microscopic-add on     Status: Abnormal   Collection Time: 06/30/16 10:15 AM  Result Value Ref Range   Squamous Epithelial / LPF 0-5 (A) NONE SEEN   WBC, UA 6-30 0 - 5 WBC/hpf   RBC / HPF NONE SEEN 0 - 5 RBC/hpf   Bacteria, UA FEW (A) NONE SEEN  CBC     Status: Abnormal   Collection Time: 06/30/16 11:59 AM  Result Value Ref Range   WBC 7.4 4.0 - 10.5 K/uL   RBC 3.65 (L) 3.87 - 5.11 MIL/uL   Hemoglobin 10.2 (L) 12.0 - 15.0 g/dL   HCT 09.829.6 (L) 11.936.0 - 14.746.0 %   MCV 81.1 78.0 - 100.0 fL   MCH 27.9 26.0 - 34.0 pg   MCHC 34.5 30.0 - 36.0 g/dL   RDW 82.915.7 (H) 56.211.5 - 13.015.5 %   Platelets 193 150 - 400 K/uL  Comprehensive metabolic panel     Status: Abnormal   Collection Time: 06/30/16 11:59 AM  Result Value Ref Range   Sodium 132 (L) 135 - 145 mmol/L   Potassium 3.5 3.5 - 5.1 mmol/L   Chloride 104 101 - 111 mmol/L   CO2 22 22 - 32 mmol/L   Glucose, Bld 87 65 - 99 mg/dL   BUN 9 6 - 20 mg/dL   Creatinine, Ser 8.650.50 0.44 - 1.00 mg/dL   Calcium 8.6 (L) 8.9 - 10.3 mg/dL    Total Protein 6.9 6.5 - 8.1 g/dL   Albumin 3.4 (L) 3.5 - 5.0 g/dL   AST 12 (L) 15 - 41 U/L   ALT 9 (L) 14 - 54 U/L   Alkaline Phosphatase 44 38 - 126 U/L   Total Bilirubin 0.5 0.3 - 1.2 mg/dL   GFR calc non Af Amer >60 >60 mL/min   GFR calc Af Amer >60 >60 mL/min   Anion gap 6 5 - 15    MDM FHT 135 by doppler Pt states was previously rx abx for UTI with  e.coli -- can't tolerate the medication -- no urinary complaints  Cervix closed IV fluid bolus, phenergan 25 mg IV, & pepcid 20 mg IV Pt not observed vomiting during MAU stay  Assessment and Plan  A: 1. Nausea and vomiting during pregnancy prior to [redacted] weeks gestation     P: Discharge home D/c abx while urine culture pending Continue zofran, phenergan, & diclegis as previously prescribed Keep f/u appt with OB or call other office to transfer care Discussed reasons to return to MAU  Judeth Horn 06/30/2016, 11:39 AM

## 2016-07-01 LAB — CULTURE, OB URINE: Culture: NO GROWTH

## 2016-07-18 ENCOUNTER — Emergency Department: Payer: Medicaid Other

## 2016-07-18 ENCOUNTER — Emergency Department
Admission: EM | Admit: 2016-07-18 | Discharge: 2016-07-18 | Disposition: A | Discharge status: Home / Self Care | Payer: Medicaid Other | Attending: Emergency Medicine | Admitting: Emergency Medicine

## 2016-07-18 DIAGNOSIS — Z3A17 17 weeks gestation of pregnancy: Secondary | ICD-10-CM | POA: Insufficient documentation

## 2016-07-18 DIAGNOSIS — R109 Unspecified abdominal pain: Secondary | ICD-10-CM

## 2016-07-18 DIAGNOSIS — M25571 Pain in right ankle and joints of right foot: Secondary | ICD-10-CM | POA: Insufficient documentation

## 2016-07-18 DIAGNOSIS — R1030 Lower abdominal pain, unspecified: Secondary | ICD-10-CM

## 2016-07-18 DIAGNOSIS — O26892 Other specified pregnancy related conditions, second trimester: Secondary | ICD-10-CM | POA: Diagnosis not present

## 2016-07-18 NOTE — ED Triage Notes (Signed)
Pt reports tripping and falling last night. Pain in lower abd and right ankle. No swelling ambulatory [redacted] weeks pregnant. Denies vaginal bleeding.

## 2016-07-18 NOTE — Discharge Instructions (Signed)
Use tylenol for the pain, Follow up with your doctor in the next few days. Please return for worse pain, fever or vomiting. Please also return or see your doctor if the ankle is no better in 3-4 days. You can try to wear the stirrup splint if it helps.

## 2016-07-18 NOTE — ED Notes (Signed)
AAOx3.  Skin warm and dry. NAD.  Ambulates with easy and steady gait.   

## 2016-07-18 NOTE — ED Provider Notes (Signed)
[facility] Emergency Department Provider Note   ____________________________________________   First MD Initiated Contact with Patient 07/18/16 [phone]     (approximate)  I have reviewed the triage vital signs and the nursing notes.   HISTORY  Chief Complaint Abdominal Pain and Ankle Pain   HPI Erika Solomon is a 20 y.o. female who tripped and fell last night. Patient complains of pain medially about 2 inches above the right malleolus and the right ankle. She also reports some bilateral lower abdominal pain she is [redacted] weeks pregnant. She did not have any pain before she fell. She does not remember hitting her belly. She reports no unusual fluid discharge and no bleeding. She always has a little bit of fluid coming out she says. Is normal for her with her pregnancies.  Past Medical History:  Diagnosis Date  . Anxiety and depression   . Hx of preeclampsia, prior pregnancy, currently pregnant     Patient Active Problem List   Diagnosis Date Noted  . UTI in pregnancy, antepartum 05/24/2016  . Pregnancy 05/21/2015  . Indication for care in labor or delivery 05/21/2015  . Mild preeclampsia 05/21/2015  . Labor and delivery indication for care or intervention 04/24/2015  . Variable fetal heart rate decelerations, antepartum 04/23/2015  . Fetal heart rate decelerations affecting management of mother 04/23/2015  . Nausea/vomiting in pregnancy 02/13/2015  . Pregnant and not yet delivered 02/12/2015  . Nausea and vomiting in pregnancy 02/12/2015    Past Surgical History:  Procedure Laterality Date  . No past surgery      Prior to Admission medications   Medication Sig Start Date End Date Taking? Authorizing Provider  HYDROcodone-acetaminophen (NORCO/VICODIN) 5-325 MG tablet Take 1 tablet by mouth every 8 (eight) hours as needed. for pain 06/10/16   Historical Provider, MD  ondansetron (ZOFRAN ODT) 4 MG disintegrating tablet Take 1 tablet (4 mg total) by  mouth every 8 (eight) hours as needed for nausea or vomiting. 05/15/16   Emily FilbertJonathan E Williams, MD  promethazine (PHENERGAN) 25 MG tablet Take 1 tablet (25 mg total) by mouth every 4 (four) hours as needed for nausea or vomiting. 05/15/16   Emily FilbertJonathan E Williams, MD    Allergies Albuterol; Benadryl [diphenhydramine]; Buspar [buspirone]; and Strawberry extract  Family History  Problem Relation Age of Onset  . Diabetes Maternal Grandfather   . Hypertension Father   . Hypertension Maternal Grandmother     Social History Social History  Substance Use Topics  . Smoking status: Never Smoker  . Smokeless tobacco: Never Used  . Alcohol use No     Comment: occasional    Review of Systems Constitutional: No fever/chills Eyes: No visual changes. ENT: No sore throat. Cardiovascular: Denies chest pain. Respiratory: Denies shortness of breath. Gastrointestinal:  abdominal pain.  No nausea, no vomiting.  No diarrhea.  No constipation. Genitourinary: Negative for dysuria. Musculoskeletal: Negative for back pain. Skin: Negative for rash. Neurological: Negative for headaches, focal weakness or numbness.  10-point ROS otherwise negative.  ____________________________________________   PHYSICAL EXAM:  VITAL SIGNS: ED Triage Vitals  Enc Vitals Group     BP 07/18/16 0832 109/60     Pulse Rate 07/18/16 0832 84     Resp 07/18/16 0832 16     Temp 07/18/16 0832 98.2 F (36.8 C)     Temp Source 07/18/16 0832 Oral     SpO2 07/18/16 0832 100 %     Weight 07/18/16 0833 176 lb (79.8 kg)  Height 07/18/16 0833 5\' 3"  (1.6 m)     Head Circumference --      Peak Flow --      Pain Score 07/18/16 0834 6     Pain Loc --      Pain Edu? --      Excl. in GC? --     Constitutional: Alert and oriented. Well appearing and in no acute distress. Eyes: Conjunctivae are normal. PERRL. EOMI. Head: Atraumatic. Nose: No congestion/rhinnorhea. Mouth/Throat: Mucous membranes are moist.  Oropharynx  non-erythematous. Neck: No stridor.   Cardiovascular: Normal rate, regular rhythm. Grossly normal heart sounds.  Good peripheral circulation. Respiratory: Normal respiratory effort.  No retractions. Lungs CTAB. Gastrointestinal: Soft and nontender. No distention. No abdominal bruits. No CVA tenderness. Musculoskeletal: No lower extremity tenderness nor edema.  No joint effusions. Neurologic:  Normal speech and language. No gross focal neurologic deficits are appreciated. No gait instability. Skin:  Skin is warm, dry and intact. No rash noted. Psychiatric: Mood and affect are normal. Speech and behavior are normal.  ____________________________________________   LABS (all labs ordered are listed, but only abnormal results are displayed)  Labs Reviewed - No data to display ____________________________________________  EKG   ____________________________________________  RADIOLOGY  Study Result   CLINICAL DATA:  20 year old pregnant female with pelvic pain following fall. Estimated gestational age of [redacted] weeks 6 days.  EXAM: LIMITED OBSTETRIC ULTRASOUND  FINDINGS: Number of Fetuses:  1  Heart Rate:  137 bpm  Movement:  Present  Presentation: Cephalic  Placental Location: Anterior  Previa: No  Amniotic Fluid (Subjective):  Within normal limits.  BPD:  2.4cm 17w  4d  MATERNAL FINDINGS:  Cervix:  Appears closed and measures 5.7 cm.  Uterus/Adnexae:  No abnormality visualized.  IMPRESSION: Single living intrauterine gestation with appropriate growth given estimated gestational age. No acute abnormalities identified.  This exam is performed on an emergent basis and does not comprehensively evaluate fetal size, dating, or anatomy; follow-up complete OB US should be considered if further fetal assessment is warranted.   Electronically Signed   By: Harmon Pier M.D.   On: 07/18/2016 10:44   Study Result   CLINICAL DATA:  Tripping and falling  last night.  Medial ankle pain.  EXAM: RIGHT ANKLE - COMPLETE 3+ VIEW  COMPARISON:  None.  FINDINGS: There is no evidence of fracture, dislocation, or joint effusion. There is no evidence of arthropathy or other focal bone abnormality. Soft tissues are unremarkable.  IMPRESSION: Negative.   Electronically Signed   By: Kennith Center M.D.   On: 07/18/2016 09:37     ____________________________________________   PROCEDURES  Procedure(s) performed Procedures  Critical Care performed:   ____________________________________________   INITIAL IMPRESSION / ASSESSMENT AND PLAN / ED COURSE  Pertinent labs & imaging results that were available during my care of the patient were reviewed by me and considered in my medical decision making (see chart for details).    Clinical Course     ____________________________________________   FINAL CLINICAL IMPRESSION(S) / ED DIAGNOSES  Final diagnoses:  Lower abdominal pain  Acute right ankle pain      NEW MEDICATIONS STARTED DURING THIS VISIT:  New Prescriptions   No medications on file     Note:  This document was prepared using Dragon voice recognition software and may include unintentional dictation errors.    Arnaldo Natal, MD 07/18/16 [phone]

## 2016-08-13 ENCOUNTER — Encounter: Payer: Self-pay | Admitting: *Deleted

## 2016-08-13 ENCOUNTER — Observation Stay
Admission: EM | Admit: 2016-08-13 | Discharge: 2016-08-14 | Disposition: A | Discharge status: Home / Self Care | Payer: Medicaid Other | Attending: Obstetrics & Gynecology | Admitting: Obstetrics & Gynecology

## 2016-08-13 DIAGNOSIS — Z3A21 21 weeks gestation of pregnancy: Secondary | ICD-10-CM | POA: Insufficient documentation

## 2016-08-13 DIAGNOSIS — W108XXA Fall (on) (from) other stairs and steps, initial encounter: Secondary | ICD-10-CM | POA: Diagnosis present

## 2016-08-13 DIAGNOSIS — Z3490 Encounter for supervision of normal pregnancy, unspecified, unspecified trimester: Secondary | ICD-10-CM

## 2016-08-13 DIAGNOSIS — W109XXA Fall (on) (from) unspecified stairs and steps, initial encounter: Secondary | ICD-10-CM | POA: Insufficient documentation

## 2016-08-13 DIAGNOSIS — Z0379 Encounter for other suspected maternal and fetal conditions ruled out: Principal | ICD-10-CM | POA: Insufficient documentation

## 2016-08-13 MED ORDER — ACETAMINOPHEN 500 MG PO TABS
1000.0000 mg | ORAL_TABLET | ORAL | Status: DC | PRN
  Administered 2016-08-14: 1000 mg via ORAL
  Filled 2016-08-13: qty 2

## 2016-08-13 NOTE — OB Triage Note (Signed)
G3P2 presents at 1169w4d after falling down 6 stairs. Slid down stairs on back. Claims she felt gush of fluid as she fell. +FM, no bleeding.

## 2016-08-14 DIAGNOSIS — W109XXA Fall (on) (from) unspecified stairs and steps, initial encounter: Secondary | ICD-10-CM | POA: Diagnosis not present

## 2016-08-14 DIAGNOSIS — W108XXA Fall (on) (from) other stairs and steps, initial encounter: Secondary | ICD-10-CM | POA: Diagnosis present

## 2016-08-14 DIAGNOSIS — Z0379 Encounter for other suspected maternal and fetal conditions ruled out: Secondary | ICD-10-CM | POA: Diagnosis not present

## 2016-08-14 DIAGNOSIS — Z3A21 21 weeks gestation of pregnancy: Secondary | ICD-10-CM | POA: Diagnosis not present

## 2016-08-14 LAB — CBC
HCT: 28.4 % — ABNORMAL LOW (ref 35.0–47.0)
Hemoglobin: 9.7 g/dL — ABNORMAL LOW (ref 12.0–16.0)
MCH: 29.2 pg (ref 26.0–34.0)
MCHC: 34.1 g/dL (ref 32.0–36.0)
MCV: 85.5 fL (ref 80.0–100.0)
Platelets: 179 10*3/uL (ref 150–440)
RBC: 3.32 MIL/uL — ABNORMAL LOW (ref 3.80–5.20)
RDW: 14.9 % — ABNORMAL HIGH (ref 11.5–14.5)
WBC: 10 10*3/uL (ref 3.6–11.0)

## 2016-08-14 NOTE — Progress Notes (Signed)
[facility] DEL[facility] [address] [id number] [place] [id number] Phone: [phone] Fax: [phone]  August 14, 2016  Patient: Erika ClicheDonna M Solomon  Date of Birth: 1996/05/08  Date of Visit: 08/13/2016    To Whom It May Concern:  Venetia ConstableDonna Deveney was seen and treated in our [facility] on 08/13/2016. Erika ClicheDonna M Vanalstine  may return to work on 08/15/2016; please excuse from work due to pregnancy related concerns on 08/14/16.  Sincerely,  Annamarie MajorPaul Jareli Highland, MD [facility]

## 2016-08-14 NOTE — Discharge Summary (Signed)
Pt d/c'd to home with significant other. Given d/c instructions and work note. Verbalized understanding.

## 2016-08-14 NOTE — Discharge Summary (Signed)
Physician Discharge Summary  Patient ID: Erika ClicheDonna M Bowlds MRN: [id number] DOB/AGE: 1996/01/19 20 y.o.  Admit date: 08/13/2016 Discharge date: 08/14/2016  Admission Diagnoses: Fall down stairs, [redacted] weeks pregnant  Discharge Diagnoses:  Principal Problem:   Fall (on) (from) other stairs and steps, initial encounter Active Problems:   Pregnant and not yet delivered   Discharged Condition: good  Hospital Course: Pt presented after falling down 6 stairs on her back, see H&P.  No s/sx PTL, Abruption during her stay and FHTs reassuring.  Back in tender and bruising but no other injuries noted.  Home w Tylenol and ice to sore areas.  Consults: None  Significant Diagnostic Studies: labs: CBC normal (anemia)  Treatments: None  Discharge Exam: Last menstrual period 03/15/2016, unknown if currently breastfeeding. General appearance: alert, cooperative and no distress Back: no skin lesions, erythema, or scars, range of motion normal FHT 140s  Disposition: 01-Home or Self Care     Medication List    STOP taking these medications   HYDROcodone-acetaminophen 5-325 MG tablet Commonly known as:  NORCO/VICODIN     TAKE these medications   ondansetron 4 MG disintegrating tablet Commonly known as:  ZOFRAN ODT Take 1 tablet (4 mg total) by mouth every 8 (eight) hours as needed for nausea or vomiting.   promethazine 25 MG tablet Commonly known as:  PHENERGAN Take 1 tablet (25 mg total) by mouth every 4 (four) hours as needed for nausea or vomiting.        Signed: Letitia LibraRobert Paul Oaklyn Mans 08/14/2016, 5:47 AM

## 2016-08-14 NOTE — H&P (Signed)
Obstetrics Admission History & Physical   Fall   HPI:  20 y.o. [id number] @ 11032w5d (12/20/2016, Date entered prior to episode creation). Admitted on 08/13/2016:   Patient Active Problem List   Diagnosis Date Noted  . Fall (on) (from) other stairs and steps, initial encounter 08/14/2016  . UTI in pregnancy, antepartum 05/24/2016  . Pregnancy 05/21/2015  . Indication for care in labor or delivery 05/21/2015  . Mild preeclampsia 05/21/2015  . Labor and delivery indication for care or intervention 04/24/2015  . Variable fetal heart rate decelerations, antepartum 04/23/2015  . Fetal heart rate decelerations affecting management of mother 04/23/2015  . Nausea/vomiting in pregnancy 02/13/2015  . Pregnant and not yet delivered 02/12/2015  . Nausea and vomiting in pregnancy 02/12/2015     Presents for fall down 6 stairs on her back with sx's of back pain especially along the right buttocks area.  No VB or ROM.  Good FM.  [facility]  PMHx:  Past Medical History:  Diagnosis Date  . Anxiety and depression   . Hx of preeclampsia, prior pregnancy, currently pregnant    PSHx:  Past Surgical History:  Procedure Laterality Date  . No past surgery     Medications:  Prescriptions Prior to Admission  Medication Sig Dispense Refill Last Dose  . HYDROcodone-acetaminophen (NORCO/VICODIN) 5-325 MG tablet Take 1 tablet by mouth every 8 (eight) hours as needed. for pain  0   . ondansetron (ZOFRAN ODT) 4 MG disintegrating tablet Take 1 tablet (4 mg total) by mouth every 8 (eight) hours as needed for nausea or vomiting. 20 tablet 1 Past Month at Unknown time  . promethazine (PHENERGAN) 25 MG tablet Take 1 tablet (25 mg total) by mouth every 4 (four) hours as needed for nausea or vomiting. 30 tablet 1 Past Month at Unknown time   Allergies: is allergic to albuterol; benadryl [diphenhydramine]; buspar [buspirone]; and strawberry extract. OBHx:  OB History  Gravida Para Term Preterm AB  Living  3 2 2  0 0 2  SAB TAB Ectopic Multiple Live Births  0 0 0 0 2    # Outcome Date GA Lbr Len/2nd Weight Sex Delivery Anes PTL Lv  3 Current           2 Term 05/22/15 5380w3d  9 lb 3.1 oz (4.17 kg) M Vag-Spont None  LIV     Complications: Pre-eclampsia  1 Term 06/19/14 5374w6d  7 lb 1 oz (3.204 kg) M Vag-Spont  N LIV    Obstetric Comments  Induced at 39 weeks in second pregnancy for pre-eclampsia.    ION:[id number]:Negative/unremarkable except as detailed in HPI. Soc Hx: Alcohol: none and Recreational drug use: none  Objective:  There were no vitals filed for this visit. General: Well nourished, well developed female in no acute distress.  Skin: Warm and dry.  Cardiovascular:Regular rate and rhythm. Respiratory: Clear to auscultation bilateral. Normal respiratory effort Abdomen: gravid NT ND Neuro/Psych: Normal mood and affect.  BACK: No CVAT; mild tenderness along lower back and buttock area; some bruising noted in buttock area; no palpable hematoma mas or other abnormality  EFM: FHT 140s on intermittant monitoing Toco: None  Assessment & Plan:   20 y.o. [id number] @ 3932w5d, Admitted on 08/13/2016: Fall down stairs   Monitor for bleeding or change in pain   No signs of abruption   No signs of other back traumatic injuries

## 2016-09-30 ENCOUNTER — Other Ambulatory Visit: Payer: Self-pay | Admitting: Obstetrics and Gynecology

## 2016-09-30 ENCOUNTER — Ambulatory Visit (INDEPENDENT_AMBULATORY_CARE_PROVIDER_SITE_OTHER): Payer: Medicaid Other | Admitting: Obstetrics and Gynecology

## 2016-09-30 ENCOUNTER — Encounter: Payer: Self-pay | Admitting: Obstetrics and Gynecology

## 2016-09-30 VITALS — BP 128/71 | HR 91 | Wt 201.1 lb

## 2016-09-30 DIAGNOSIS — Z113 Encounter for screening for infections with a predominantly sexual mode of transmission: Secondary | ICD-10-CM

## 2016-09-30 DIAGNOSIS — Z23 Encounter for immunization: Secondary | ICD-10-CM | POA: Diagnosis not present

## 2016-09-30 DIAGNOSIS — Z3483 Encounter for supervision of other normal pregnancy, third trimester: Secondary | ICD-10-CM

## 2016-09-30 DIAGNOSIS — O09293 Supervision of pregnancy with other poor reproductive or obstetric history, third trimester: Secondary | ICD-10-CM

## 2016-09-30 DIAGNOSIS — O99210 Obesity complicating pregnancy, unspecified trimester: Secondary | ICD-10-CM

## 2016-09-30 DIAGNOSIS — Z369 Encounter for antenatal screening, unspecified: Secondary | ICD-10-CM

## 2016-09-30 DIAGNOSIS — Z131 Encounter for screening for diabetes mellitus: Secondary | ICD-10-CM

## 2016-09-30 DIAGNOSIS — O219 Vomiting of pregnancy, unspecified: Secondary | ICD-10-CM

## 2016-09-30 DIAGNOSIS — O0930 Supervision of pregnancy with insufficient antenatal care, unspecified trimester: Secondary | ICD-10-CM

## 2016-09-30 MED ORDER — PANTOPRAZOLE SODIUM 40 MG PO TBEC: 40.0000 mg | DELAYED_RELEASE_TABLET | Freq: Two times a day (BID) | ORAL | 2 refills | Status: DC

## 2016-09-30 NOTE — Progress Notes (Signed)
OBSTETRIC INITIAL PRENATAL VISIT  Subjective:    Erika Solomon is being seen today for her first obstetrical visit.  This is not a planned pregnancy. She is a [id number] female at 3129w3d gestation, Estimated Date of Delivery: 12/20/16 with Patient's last menstrual period was 03/15/2016. Her obstetrical history is significant for late entry to prenatal care, and  pre-eclampsia in both prior pregnancies. Relationship with FOB: significant other, living together. Patient does intend to breast feed. Pregnancy history fully reviewed.    Obstetric History   G3   P2   T2   P0   A0   L2    SAB0   TAB0   Ectopic0   Multiple0   Live Births2     # Outcome Date GA Lbr Len/2nd Weight Sex Delivery Anes PTL Lv  3 Current           2 Term 05/22/15 8353w3d  9 lb 3.1 oz (4.17 kg) M Vag-Spont None  LIV     Name: Erika Solomon     Complications: Pre-eclampsia     Apgar1:  6                Apgar5: 9  1 Term 06/19/14 9365w6d  7 lb 1 oz (3.204 kg) M Vag-Spont  N LIV     Name: Erika Solomon     Complications: Preeclampsia    Obstetric Comments  Induced at 39 weeks in second pregnancy for pre-eclampsia.     Gynecologic History:  Last pap smear, patient unsure but thinks with last pregnancy.  Results were normal.  Denies h/o abnormal pap smears in the past.  Denies history of STIs.    Past Medical History:  Diagnosis Date  . Anxiety and depression   . Hx of preeclampsia, prior pregnancy, currently pregnant     Family History  Problem Relation Age of Onset  . Diabetes Maternal Grandfather   . Hypertension Father   . Hypertension Maternal Grandmother     Past Surgical History:  Procedure Laterality Date  . No past surgery      Social History   Social History  . Marital status: Single    Spouse name: N/A  . Number of children: N/A  . 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 Smoking status: Never Smoker  . Smokeless  tobacco: Never Used  . Alcohol use No     Comment: occasional  . Drug use: No  . Sexual activity: Yes    Birth control/ protection: None   Other Topics Concern  . Not on file   Social History Narrative  . No narrative on file    No current outpatient prescriptions on file prior to visit.   No current facility-administered medications on file prior to visit.     Allergies  Allergen Reactions  . Albuterol Anaphylaxis  . Benadryl [Diphenhydramine] Anaphylaxis  . Buspar [Buspirone] Anaphylaxis  . Strawberry Extract Anaphylaxis    Review of Systems General:Not Present- Fever, Weight Loss and Weight Gain. Skin:Not Present- Rash. HEENT:Not Present- Blurred Vision, Headache and Bleeding Gums. Respiratory:Not Present- Difficulty Breathing. Breast:Not Present- Breast Mass. Cardiovascular:Not Present- Chest Pain, Elevated Blood Pressure, Fainting / Blacking Out and Shortness of Breath. Gastrointestinal:Nausea and Vomiting (mild, occasional). Not Present- Abdominal Pain, Constipation.  Female Genitourinary:Not Present- Frequency, Painful Urination, Pelvic Pain, Vaginal Bleeding, Vaginal Discharge, Contractions, regular, Fetal Movements Decreased, Urinary Complaints and Vaginal  Fluid. Musculoskeletal:Not Present- Back Pain and Leg Cramps. Neurological:Not Present- Dizziness. Psychiatric:Not Present- Depression.     Objective:   Blood pressure 128/71, pulse 91, weight 201 lb 1.6 oz (91.2 kg), last menstrual period 03/15/2016, unknown if currently breastfeeding.  Body mass index is 35.62 kg/m.  General Appearance:    Alert, cooperative, no distress, appears stated age, moderately obese  Head:    Normocephalic, without obvious abnormality, atraumatic  Eyes:    PERRL, conjunctiva/corneas clear, EOM's intact, both eyes  Ears:    Normal external ear canals, both ears  Nose:   Nares normal, septum midline, mucosa normal, no drainage or sinus tenderness  Throat:   Lips,  mucosa, and tongue normal; teeth and gums normal  Neck:   Supple, symmetrical, trachea midline, no adenopathy; thyroid: no enlargement/tenderness/nodules; no carotid bruit or JVD  Back:     Symmetric, no curvature, ROM normal, no CVA tenderness  Lungs:     Clear to auscultation bilaterally, respirations unlabored  Chest Wall:    No tenderness or deformity   Heart:    Regular rate and rhythm, S1 and S2 normal, no murmur, rub or gallop  Breast Exam:    No tenderness, masses, or nipple abnormality  Abdomen:     Soft, non-tender, bowel sounds active all four quadrants, no masses, no organomegaly.  FH 29.  FHT 148 bpm.  Genitalia:    Pelvic:external genitalia normal, vagina without lesions, discharge, or tenderness, rectovaginal septum  normal. Cervix normal in appearance, no cervical motion tenderness, no adnexal masses or tenderness.  Pregnancy positive findings: uterine enlargement: 29 wk size, nontender.   Rectal:    Normal external sphincter.  No hemorrhoids appreciated. Internal exam not done.   Extremities:   Extremities normal, atraumatic, no cyanosis or edema  Pulses:   2+ and symmetric all extremities  Skin:   Skin color, texture, turgor normal, no rashes or lesions  Lymph nodes:   Cervical, supraclavicular, and axillary nodes normal  Neurologic:   CNII-XII intact, normal strength, sensation and reflexes throughout     Assessment:   Pregnancy at 28 and 3/7 weeks   Nausea/vomiting of pregnancy (mild) H/o pre-eclampsia in prior pregnancies Obesity (Class II) Late entry to prenatal care (no care to date)  Plan:   Initial labs ordered. Prenatal vitamins encouraged. Problem list reviewed and updated. New OB counseling:  The patient has been given an overview regarding routine prenatal care.  Recommendations regarding diet, weight gain, and exercise in pregnancy were given. Prenatal testing, optional genetic testing, and ultrasound use in pregnancy were reviewed.   Patient out of range  by dates for traditional genetic testing.  Discussed cell-free DNA testing. Patient declines.  Benefits of Breast Feeding were discussed. The patient is encouraged to consider nursing her baby post partum. Late entry to prenatal care, patient does not report any reasoning to not beginning care sooner.  For flu vaccine today To be scheduled for glucola and anatomy scan tomorrow.  H/o pre-eclampsia in prior pregnancies, will monitor. Baseline Cr ordered.  Nausea/vomting in pregnancy, mild, occasional.  Will continue to manage with diet changes and ginger.  Follow up in 2 weeks.  50% of 30 min visit spent on counseling and coordination of care.    Hildred LaserAnika Ravyn Nikkel, MD [facility]

## 2016-10-01 ENCOUNTER — Ambulatory Visit (INDEPENDENT_AMBULATORY_CARE_PROVIDER_SITE_OTHER): Payer: Medicaid Other

## 2016-10-01 ENCOUNTER — Other Ambulatory Visit: Payer: Medicaid Other

## 2016-10-01 DIAGNOSIS — O99210 Obesity complicating pregnancy, unspecified trimester: Secondary | ICD-10-CM

## 2016-10-01 DIAGNOSIS — O0930 Supervision of pregnancy with insufficient antenatal care, unspecified trimester: Secondary | ICD-10-CM

## 2016-10-01 DIAGNOSIS — Z131 Encounter for screening for diabetes mellitus: Secondary | ICD-10-CM

## 2016-10-01 DIAGNOSIS — Z3483 Encounter for supervision of other normal pregnancy, third trimester: Secondary | ICD-10-CM

## 2016-10-02 LAB — CBC WITH DIFFERENTIAL/PLATELET
Basophils Absolute: 0 10*3/uL (ref 0.0–0.2)
Basos: 0 %
EOS (ABSOLUTE): 0 10*3/uL (ref 0.0–0.4)
Eos: 1 %
Hematocrit: 28 % — ABNORMAL LOW (ref 34.0–46.6)
Hemoglobin: 9.1 g/dL — ABNORMAL LOW (ref 11.1–15.9)
Immature Grans (Abs): 0.1 10*3/uL (ref 0.0–0.1)
Immature Granulocytes: 1 %
Lymphocytes Absolute: 0.8 10*3/uL (ref 0.7–3.1)
Lymphs: 10 %
MCH: 27.1 pg (ref 26.6–33.0)
MCHC: 32.5 g/dL (ref 31.5–35.7)
MCV: 83 fL (ref 79–97)
Monocytes Absolute: 0.6 10*3/uL (ref 0.1–0.9)
Monocytes: 8 %
Neutrophils Absolute: 6.5 10*3/uL (ref 1.4–7.0)
Neutrophils: 80 %
Platelets: 169 10*3/uL (ref 150–379)
RBC: 3.36 x10E6/uL — ABNORMAL LOW (ref 3.77–5.28)
RDW: 14.9 % (ref 12.3–15.4)
WBC: 8 10*3/uL (ref 3.4–10.8)

## 2016-10-02 LAB — HEPATITIS B SURFACE ANTIGEN: Hepatitis B Surface Ag: NEGATIVE

## 2016-10-02 LAB — ANTIBODY SCREEN: Antibody Screen: NEGATIVE

## 2016-10-02 LAB — TSH: TSH: 1.66 u[IU]/mL (ref 0.450–4.500)

## 2016-10-02 LAB — CREATINE

## 2016-10-02 LAB — ABO AND RH: Rh Factor: POSITIVE

## 2016-10-02 LAB — RPR: RPR Ser Ql: NONREACTIVE

## 2016-10-02 LAB — VARICELLA ZOSTER ANTIBODY, IGG: Varicella zoster IgG: <135 index — ABNORMAL LOW (ref >165)

## 2016-10-02 LAB — RUBELLA SCREEN: Rubella Antibodies, IGG: 1.09 index (ref >0.99)

## 2016-10-02 LAB — HIV ANTIBODY (ROUTINE TESTING W REFLEX): HIV Screen 4th Generation wRfx: NONREACTIVE

## 2016-10-02 LAB — GLUCOSE, 1 HOUR GESTATIONAL: Gestational Diabetes Screen: 105 mg/dL (ref 65–139)

## 2016-10-05 ENCOUNTER — Encounter: Payer: Self-pay | Admitting: Obstetrics and Gynecology

## 2016-10-05 DIAGNOSIS — Z2839 Other underimmunization status: Secondary | ICD-10-CM | POA: Insufficient documentation

## 2016-10-05 DIAGNOSIS — O99013 Anemia complicating pregnancy, third trimester: Secondary | ICD-10-CM | POA: Insufficient documentation

## 2016-10-05 DIAGNOSIS — Z283 Underimmunization status: Secondary | ICD-10-CM

## 2016-10-05 DIAGNOSIS — O09899 Supervision of other high risk pregnancies, unspecified trimester: Secondary | ICD-10-CM | POA: Insufficient documentation

## 2016-10-05 DIAGNOSIS — O99012 Anemia complicating pregnancy, second trimester: Secondary | ICD-10-CM | POA: Insufficient documentation

## 2016-10-08 LAB — NUSWAB VAGINITIS PLUS (VG+)
Candida albicans, NAA: NEGATIVE
Candida glabrata, NAA: NEGATIVE
Chlamydia trachomatis, NAA: NEGATIVE
Neisseria gonorrhoeae, NAA: NEGATIVE
Trich vag by NAA: NEGATIVE

## 2016-10-13 ENCOUNTER — Ambulatory Visit (INDEPENDENT_AMBULATORY_CARE_PROVIDER_SITE_OTHER): Payer: Medicaid Other | Admitting: Obstetrics and Gynecology

## 2016-10-13 VITALS — BP 112/66 | HR 101 | Wt 205.4 lb

## 2016-10-13 DIAGNOSIS — Z3483 Encounter for supervision of other normal pregnancy, third trimester: Secondary | ICD-10-CM

## 2016-10-13 DIAGNOSIS — O99012 Anemia complicating pregnancy, second trimester: Secondary | ICD-10-CM

## 2016-10-13 DIAGNOSIS — R0781 Pleurodynia: Secondary | ICD-10-CM

## 2016-10-13 MED ORDER — DOCUSATE SODIUM 100 MG PO CAPS: 100.0000 mg | ORAL_CAPSULE | Freq: Two times a day (BID) | ORAL | 2 refills | Status: DC | PRN

## 2016-10-13 MED ORDER — FERROUS SULFATE 325 (65 FE) MG PO TABS: 325.0000 mg | ORAL_TABLET | Freq: Two times a day (BID) | ORAL | 1 refills | Status: DC

## 2016-10-13 NOTE — Progress Notes (Signed)
ROB: Notes Braxton Hicks occasionally. C/o left-sided rib pain.  Likely due to fetal positioning, may be costochondritis. Advised on Tylenol prn. Unable to void today. Normal 1 hr glucola, anemia noted on labs, advised on iron supplementation. RTC in 2 weeks.

## 2016-12-01 ENCOUNTER — Observation Stay
Admission: EM | Admit: 2016-12-01 | Discharge: 2016-12-01 | Disposition: A | Discharge status: Home / Self Care | Payer: Medicaid Other | Attending: Obstetrics & Gynecology | Admitting: Obstetrics & Gynecology

## 2016-12-01 DIAGNOSIS — A084 Viral intestinal infection, unspecified: Secondary | ICD-10-CM | POA: Diagnosis not present

## 2016-12-01 DIAGNOSIS — O26893 Other specified pregnancy related conditions, third trimester: Secondary | ICD-10-CM

## 2016-12-01 DIAGNOSIS — O219 Vomiting of pregnancy, unspecified: Secondary | ICD-10-CM | POA: Diagnosis present

## 2016-12-01 DIAGNOSIS — Z3A37 37 weeks gestation of pregnancy: Secondary | ICD-10-CM | POA: Insufficient documentation

## 2016-12-01 DIAGNOSIS — O98513 Other viral diseases complicating pregnancy, third trimester: Secondary | ICD-10-CM | POA: Diagnosis not present

## 2016-12-01 DIAGNOSIS — R112 Nausea with vomiting, unspecified: Secondary | ICD-10-CM

## 2016-12-01 MED ORDER — ONDANSETRON HCL 4 MG/2ML IJ SOLN
4.0000 mg | Freq: Four times a day (QID) | INTRAMUSCULAR | Status: DC | PRN
Start: 2016-12-01 — End: 2016-12-01
  Administered 2016-12-01: 4 mg via INTRAMUSCULAR
  Filled 2016-12-01: qty 2

## 2016-12-01 MED ORDER — ACETAMINOPHEN 325 MG PO TABS
650.0000 mg | ORAL_TABLET | ORAL | Status: DC | PRN
  Administered 2016-12-01: 650 mg via ORAL
  Filled 2016-12-01: qty 2

## 2016-12-01 MED ORDER — LOPERAMIDE HCL 2 MG PO CAPS: 2.0000 mg | ORAL_CAPSULE | ORAL | Status: DC | PRN

## 2016-12-01 NOTE — Discharge Instructions (Signed)
Drink plenty of fluid and get rest. Call your provider for any other questions.

## 2016-12-01 NOTE — Final Progress Note (Signed)
Physician Final Progress Note  Patient ID: Erika ClicheDonna M Smallman MRN: [id number] DOB/AGE: Apr 29, 1996 21 y.o.  Admit date: 12/01/2016 Admitting provider: Nadara Mustardobert P Nelani Schmelzle, MD Discharge date: 12/01/2016   Admission Diagnoses: Nausea, vomiting, diarrhea, pregnancy 37 weeks  Discharge Diagnoses: same; viral gastroenteritis  Consults: None  Significant Findings/ Diagnostic Studies: Pt is a 21 yo G3 P2 at [redacted] weeks EGA with 2 week h/o n/v/d that has been intermittant but a little worse today along with dizziness today.  No syncope.  No s/sx preclampsia (prior h/o preeclampsia prior pregnancies).  [facility], no other complications. PMH- anxiety PSH- none FH- denies gyn cancer or birth defects SH- no tob/alc/drugs Meds- PNV All- albuterol, benadryl, buspar  Review of Systems  Constitutional: Negative for chills, fever and malaise/fatigue.  HENT: Negative for congestion, sinus pain and sore throat.   Eyes: Negative for blurred vision and pain.  Respiratory: Negative for cough and wheezing.   Cardiovascular: Negative for chest pain and leg swelling.  Gastrointestinal: Negative for abdominal pain, constipation, diarrhea, heartburn, nausea and vomiting.  Genitourinary: Negative for dysuria, frequency, hematuria and urgency.  Musculoskeletal: Negative for back pain, joint pain, myalgias and neck pain.  Skin: Negative for itching and rash.  Neurological: Negative for dizziness, tremors and weakness.  Endo/Heme/Allergies: Does not bruise/bleed easily.  Psychiatric/Behavioral: Negative for depression. The patient is not nervous/anxious and does not have insomnia.    Physical Exam  Constitutional: She is oriented to person, place, and time. She appears well-developed and well-nourished. No distress.  Genitourinary: Rectum normal and vagina normal. Pelvic exam was performed with patient supine. There is no rash or lesion on the right labia. There is no rash or lesion on the left labia. Vagina  exhibits no lesion. No bleeding in the vagina.  Genitourinary Comments: Cervix 1 thick high  Cardiovascular: Normal rate.   Pulmonary/Chest: Effort normal.  Abdominal: Soft. Bowel sounds are normal. She exhibits no distension. There is no tenderness. There is no rebound.  Musculoskeletal: Normal range of motion.  Neurological: She is alert and oriented to person, place, and time.  Skin: Skin is warm and dry.  Psychiatric: She has a normal mood and affect.  Vitals reviewed.  Pt monitored for signs/sx's of labor (none) and for nausea/diarrhea.  Fluids and rest advised.  Procedures: A NST procedure was performed with FHR monitoring and a normal baseline established, appropriate time of 20-40 minutes of evaluation, and accels >2 seen w 15x15 characteristics.  Results show a REACTIVE NST.   Discharge Condition: good  Disposition: 01-Home or Self Care  Diet: Regular diet  Discharge Activity: Activity as tolerated   Allergies as of 12/01/2016      Reactions   Albuterol Anaphylaxis   Benadryl [diphenhydramine] Anaphylaxis   Buspar [buspirone] Anaphylaxis   Strawberry Extract Anaphylaxis      Medication List    TAKE these medications   docusate sodium 100 MG capsule Commonly known as:  COLACE Take 1 capsule (100 mg total) by mouth 2 (two) times daily as needed.   ferrous sulfate 325 (65 FE) MG tablet Commonly known as:  FERROUSUL Take 1 tablet (325 mg total) by mouth 2 (two) times daily.   multivitamin-prenatal 27-0.8 MG Tabs tablet Take 1 tablet by mouth daily at 12 noon.   pantoprazole 40 MG tablet Commonly known as:  PROTONIX Take 1 tablet (40 mg total) by mouth 2 (two) times daily.      Follow-up Information    Letitia Libraobert Paul Gwendolyn Mclees, MD. Go in 5  day(s).   Specialty:  Obstetrics and Gynecology Why:  as scheduled Contact information: [address] [place] [address] [phone]           Total time spent taking care of this patient: 15  minutes  Signed: Letitia Libra 12/01/2016, 1:46 PM

## 2016-12-01 NOTE — OB Triage Note (Signed)
Pt G3P2 5454w2d complains of n/n/d and cramping in lower abdomen. States n/v began 11/30/16 around 2200 and pt states she has vomited 3 times since then. Pt states she can not hold any fluids or food down. Pt states diarrhea for about 1-2 weeks with no relief. Pt rates cramping a 3/10 for pain currently and when ctx come she says they get to a 7/10. Pt states + FM and denies leaking of fluid, vaginal bleeding and vaginal discharge.

## 2016-12-06 ENCOUNTER — Encounter: Payer: Medicaid Other | Admitting: Certified Nurse Midwife

## 2016-12-09 ENCOUNTER — Encounter: Payer: Medicaid Other | Admitting: Certified Nurse Midwife

## 2016-12-13 ENCOUNTER — Other Ambulatory Visit: Payer: Self-pay | Admitting: Obstetrics & Gynecology

## 2016-12-13 ENCOUNTER — Encounter: Payer: Self-pay | Admitting: Obstetrics & Gynecology

## 2016-12-13 ENCOUNTER — Ambulatory Visit: Payer: Self-pay

## 2016-12-13 DIAGNOSIS — O1490 Unspecified pre-eclampsia, unspecified trimester: Secondary | ICD-10-CM

## 2016-12-13 DIAGNOSIS — O403XX0 Polyhydramnios, third trimester, not applicable or unspecified: Secondary | ICD-10-CM

## 2016-12-15 ENCOUNTER — Telehealth: Payer: Self-pay

## 2016-12-15 NOTE — Telephone Encounter (Signed)
Advised pt normal to lose mucous after membrane stripping. Pt denies LOF or bloody show. Encouraged to keep track of baby's movement. Ctx are intermittent. Pt coming in tomorrow to set up IOL.

## 2016-12-15 NOTE — Telephone Encounter (Signed)
Pt states she is 39 wk 2 days pregnant & had her membranes stripped earlier this week. Inquiring if it's normal to lose large globs of mucous. She's lost 4 since last night. States [facility] is her provider.

## 2016-12-16 ENCOUNTER — Ambulatory Visit (INDEPENDENT_AMBULATORY_CARE_PROVIDER_SITE_OTHER): Payer: Medicaid Other | Admitting: Obstetrics & Gynecology

## 2016-12-16 VITALS — BP 130/80 | HR 101 | Wt 223.0 lb

## 2016-12-16 DIAGNOSIS — Z3A39 39 weeks gestation of pregnancy: Secondary | ICD-10-CM

## 2016-12-16 NOTE — Progress Notes (Signed)
See [facility]

## 2016-12-17 ENCOUNTER — Observation Stay
Admission: EM | Admit: 2016-12-17 | Discharge: 2016-12-17 | Disposition: A | Discharge status: Home / Self Care | Payer: Medicaid Other | Attending: Obstetrics and Gynecology | Admitting: Obstetrics and Gynecology

## 2016-12-17 DIAGNOSIS — Z3A00 Weeks of gestation of pregnancy not specified: Secondary | ICD-10-CM | POA: Insufficient documentation

## 2016-12-17 DIAGNOSIS — O99519 Diseases of the respiratory system complicating pregnancy, unspecified trimester: Secondary | ICD-10-CM | POA: Insufficient documentation

## 2016-12-17 DIAGNOSIS — J00 Acute nasopharyngitis [common cold]: Secondary | ICD-10-CM | POA: Diagnosis present

## 2016-12-17 DIAGNOSIS — J069 Acute upper respiratory infection, unspecified: Secondary | ICD-10-CM

## 2016-12-17 LAB — CBC
HCT: 25.4 % — ABNORMAL LOW (ref 35.0–47.0)
Hemoglobin: 8.5 g/dL — ABNORMAL LOW (ref 12.0–16.0)
MCH: 25.2 pg — ABNORMAL LOW (ref 26.0–34.0)
MCHC: 33.2 g/dL (ref 32.0–36.0)
MCV: 75.7 fL — ABNORMAL LOW (ref 80.0–100.0)
Platelets: 186 10*3/uL (ref 150–440)
RBC: 3.36 MIL/uL — ABNORMAL LOW (ref 3.80–5.20)
RDW: 17.4 % — ABNORMAL HIGH (ref 11.5–14.5)
WBC: 8.6 10*3/uL (ref 3.6–11.0)

## 2016-12-17 LAB — INFLUENZA PANEL BY PCR (TYPE A & B)
Influenza A By PCR: NEGATIVE
Influenza B By PCR: NEGATIVE

## 2016-12-17 MED ORDER — ACETAMINOPHEN 325 MG PO TABS: 650.0000 mg | ORAL_TABLET | ORAL | Status: DC | PRN

## 2016-12-17 MED ORDER — FERROUS SULFATE 325 (65 FE) MG PO TABS: 325.0000 mg | ORAL_TABLET | Freq: Two times a day (BID) | ORAL | 1 refills | Status: DC

## 2016-12-17 MED ORDER — BENZONATATE 100 MG PO CAPS: 100.0000 mg | ORAL_CAPSULE | Freq: Three times a day (TID) | ORAL | 0 refills | Status: DC | PRN

## 2016-12-17 NOTE — Discharge Instructions (Signed)

## 2016-12-17 NOTE — OB Triage Note (Signed)
Pt arrived to obs rm 3 from ED with c/o a head cold starting 3 days ago. Pt also had membranes stripped in office yesterday and is now c/o contractions. Pt came to ED for cold systems and wanted to be seen, but was brought to L&D for possible contractions. Pt placed on monitor and oriented to room. Will cont. To monitor.

## 2016-12-17 NOTE — Final Progress Note (Signed)
Physician Final Progress Note  Patient ID: Erika ClicheDonna M Pitney MRN: [id number] DOB/AGE: 1996-04-29 21 y.o.  Admit date: 12/17/2016 Admitting provider: Vena AustriaAndreas Ayrianna Mcginniss, MD Discharge date: 12/17/2016   Admission Diagnoses: URI  Discharge Diagnoses:  Active Problems:   Labor and delivery indication for care or intervention URI  Consults: None  Significant Findings/ Diagnostic Studies:  Results for orders placed or performed during the hospital encounter of 12/17/16 (from the past 24 hour(s))  CBC     Status: Abnormal   Collection Time: 12/17/16  9:23 AM  Result Value Ref Range   WBC 8.6 3.6 - 11.0 K/uL   RBC 3.36 (L) 3.80 - 5.20 MIL/uL   Hemoglobin 8.5 (L) 12.0 - 16.0 g/dL   HCT 78.225.4 (L) 95.635.0 - 21.347.0 %   MCV 75.7 (L) 80.0 - 100.0 fL   MCH 25.2 (L) 26.0 - 34.0 pg   MCHC 33.2 32.0 - 36.0 g/dL   RDW 08.617.4 (H) 57.811.5 - 46.914.5 %   Platelets 186 150 - 440 K/uL  Influenza panel by PCR (type A & B)     Status: None   Collection Time: 12/17/16  9:57 AM  Result Value Ref Range   Influenza A By PCR NEGATIVE NEGATIVE   Influenza B By PCR NEGATIVE NEGATIVE     Procedures: NST  Baseline: 140 Variability: moderate Accelerations: present Decelerations: absent Tocometry: none The patient was monitored for 30 minutes, fetal heart rate tracing was deemed reactive, category I tracing,   Discharge Condition: good  Disposition: 01-Home or Self Care  Diet: Regular diet  Discharge Activity: Activity as tolerated  Discharge Instructions    Discharge activity:  No Restrictions    Complete by:  As directed    Discharge diet:  No restrictions    Complete by:  As directed    Fetal Kick Count:  Lie on our left side for one hour after a meal, and count the number of times your baby kicks.  If it is less than 5 times, get up, move around and drink some juice.  Repeat the test 30 minutes later.  If it is still less than 5 kicks in an hour, notify your doctor.    Complete by:  As directed    LABOR:   When conractions begin, you should start to time them from the beginning of one contraction to the beginning  of the next.  When contractions are 5 - 10 minutes apart or less and have been regular for at least an hour, you should call your health care provider.    Complete by:  As directed    No sexual activity restrictions    Complete by:  As directed    Notify physician for bleeding from the vagina    Complete by:  As directed    Notify physician for blurring of vision or spots before the eyes    Complete by:  As directed    Notify physician for chills or fever    Complete by:  As directed    Notify physician for fainting spells, "black outs" or loss of consciousness    Complete by:  As directed    Notify physician for increase in vaginal discharge    Complete by:  As directed    Notify physician for leaking of fluid    Complete by:  As directed    Notify physician for pain or burning when urinating    Complete by:  As directed    Notify physician for pelvic  pressure (sudden increase)    Complete by:  As directed    Notify physician for severe or continued nausea or vomiting    Complete by:  As directed    Notify physician for sudden gushing of fluid from the vagina (with or without continued leaking)    Complete by:  As directed    Notify physician for sudden, constant, or occasional abdominal pain    Complete by:  As directed    Notify physician if baby moving less than usual    Complete by:  As directed      Allergies as of 12/17/2016      Reactions   Albuterol Anaphylaxis   Benadryl [diphenhydramine] Anaphylaxis   Buspar [buspirone] Anaphylaxis   Strawberry Extract Anaphylaxis      Medication List    STOP taking these medications   docusate sodium 100 MG capsule Commonly known as:  COLACE     TAKE these medications   benzonatate 100 MG capsule Commonly known as:  TESSALON PERLES Take 1 capsule (100 mg total) by mouth 3 (three) times daily as needed for cough.    ferrous sulfate 325 (65 FE) MG tablet Commonly known as:  FERROUSUL Take 1 tablet (325 mg total) by mouth 2 (two) times daily.   multivitamin-prenatal 27-0.8 MG Tabs tablet Take 1 tablet by mouth daily at 12 noon.   pantoprazole 40 MG tablet Commonly known as:  PROTONIX Take 1 tablet (40 mg total) by mouth 2 (two) times daily.        Total time spent taking care of this patient: 20 minutes  Signed: Vena Austria 12/17/2016, 11:42 AM

## 2016-12-17 NOTE — Discharge Summary (Signed)
See final progress note. 

## 2016-12-20 ENCOUNTER — Ambulatory Visit (INDEPENDENT_AMBULATORY_CARE_PROVIDER_SITE_OTHER): Payer: Medicaid Other | Admitting: Obstetrics & Gynecology

## 2016-12-20 VITALS — BP 138/80 | Wt 228.0 lb

## 2016-12-20 DIAGNOSIS — Z3A40 40 weeks gestation of pregnancy: Secondary | ICD-10-CM

## 2016-12-20 NOTE — Progress Notes (Signed)
See [facility] 

## 2016-12-20 NOTE — Patient Instructions (Signed)
Augmentation of Labor Augmentation of labor is when steps are taken to stimulate and strengthen uterine contractions during labor. This may be done when the contractions have slowed down or stopped, delaying progress of labor and delivery of the baby. Before beginning augmentation of labor, the health care provider will evaluate the condition of the mother and baby, the size and position of the baby, and the size of the birth canal. What are the reasons for labor augmentation? Reasons for augmentation of labor include:  Slow labor (prolonged first and second stage of labor) that has been associated with increased maternal risks, such as chorioamnionitis, postpartum hemorrhage, operative vaginal delivery, or third-degree or fourth-degree perineal lacerations.  Decreased average length of labor.  What methods are used for labor augmentation? Various methods may be used for augmentation of labor, including:  Oxytocin medicine. This medicine stimulates contractions. It is given through an IV access tube inserted into a vein.  Breaking the fluid-filled sac that surrounds the fetus (amniotic sac).  Stripping the membranes. The health care provider separates amniotic sac tissue from the cervix, causing the release of a hormone called progesterone that can stimulate uterine contractions.  Nipple stimulation.  Stimulation of certain pressure points on the ankles.  Manual or mechanical dilation of the cervix.  What are the risks associated with labor augmentation?  Overstimulation of the uterine contractions (continuous, prolonged, very strong contractions), causing fetal distress.  Increased chance of infection for the mother and baby.  Uterine tearing (rupture).  Breaking off (abruption) of the placenta.  Increased chance of cesarean, forceps, or vacuum delivery. What are some reasons for not doing labor augmentation? Augmentation of labor should not be done if:  The baby is too big for  the birth canal. This can be confirmed by ultrasonography.  The umbilical cord drops in front of the baby's head or breech part (prolapsed cord).  The mother had a previous cesarean delivery with a vertical incision in the uterus (or the kind of incision used is not known). High dose oxytocin should not be used if the mother had a previous cesarean delivery of any kind.  The mother had previous surgery on or into the uterus.  The mother has herpes.  The mother has cervical cancer.  The baby is lying sideways.  The mother's pelvis is deformed.  The mother is pregnant with more than two babies.  This information is not intended to replace advice given to you by your health care provider. Make sure you discuss any questions you have with your health care provider. Document Released: 03/15/2007 Document Revised: 03/03/2016 Document Reviewed: 04/19/2013 Elsevier Interactive Patient Education  2017 [employer]  

## 2016-12-23 ENCOUNTER — Inpatient Hospital Stay
Admission: EM | Admit: 2016-12-23 | Discharge: 2016-12-26 | DRG: 765 | Disposition: A | Discharge status: Home / Self Care | Payer: Medicaid Other | Attending: Obstetrics and Gynecology | Admitting: Obstetrics and Gynecology

## 2016-12-23 ENCOUNTER — Encounter
Admission: EM | Disposition: A | Discharge status: Home / Self Care | Payer: Self-pay | Source: Home / Self Care | Attending: Obstetrics and Gynecology

## 2016-12-23 ENCOUNTER — Inpatient Hospital Stay: Payer: Medicaid Other | Admitting: Anesthesiology

## 2016-12-23 DIAGNOSIS — Z3A40 40 weeks gestation of pregnancy: Secondary | ICD-10-CM | POA: Diagnosis not present

## 2016-12-23 DIAGNOSIS — F418 Other specified anxiety disorders: Secondary | ICD-10-CM | POA: Diagnosis present

## 2016-12-23 DIAGNOSIS — D62 Acute posthemorrhagic anemia: Secondary | ICD-10-CM | POA: Diagnosis not present

## 2016-12-23 DIAGNOSIS — O3663X0 Maternal care for excessive fetal growth, third trimester, not applicable or unspecified: Secondary | ICD-10-CM | POA: Diagnosis present

## 2016-12-23 DIAGNOSIS — Z8249 Family history of ischemic heart disease and other diseases of the circulatory system: Secondary | ICD-10-CM | POA: Diagnosis not present

## 2016-12-23 DIAGNOSIS — O9962 Diseases of the digestive system complicating childbirth: Secondary | ICD-10-CM | POA: Diagnosis present

## 2016-12-23 DIAGNOSIS — Z6839 Body mass index (BMI) 39.0-39.9, adult: Secondary | ICD-10-CM | POA: Diagnosis not present

## 2016-12-23 DIAGNOSIS — O99214 Obesity complicating childbirth: Secondary | ICD-10-CM | POA: Diagnosis present

## 2016-12-23 DIAGNOSIS — O9081 Anemia of the puerperium: Secondary | ICD-10-CM | POA: Diagnosis not present

## 2016-12-23 DIAGNOSIS — K219 Gastro-esophageal reflux disease without esophagitis: Secondary | ICD-10-CM | POA: Diagnosis present

## 2016-12-23 DIAGNOSIS — O99019 Anemia complicating pregnancy, unspecified trimester: Secondary | ICD-10-CM

## 2016-12-23 DIAGNOSIS — Z833 Family history of diabetes mellitus: Secondary | ICD-10-CM

## 2016-12-23 DIAGNOSIS — O480 Post-term pregnancy: Secondary | ICD-10-CM | POA: Diagnosis present

## 2016-12-23 DIAGNOSIS — O99344 Other mental disorders complicating childbirth: Secondary | ICD-10-CM | POA: Diagnosis present

## 2016-12-23 DIAGNOSIS — D509 Iron deficiency anemia, unspecified: Secondary | ICD-10-CM | POA: Diagnosis present

## 2016-12-23 LAB — CBC
HCT: 26 % — ABNORMAL LOW (ref 35.0–47.0)
Hemoglobin: 8.4 g/dL — ABNORMAL LOW (ref 12.0–16.0)
MCH: 24.4 pg — ABNORMAL LOW (ref 26.0–34.0)
MCHC: 32.3 g/dL (ref 32.0–36.0)
MCV: 75.4 fL — ABNORMAL LOW (ref 80.0–100.0)
Platelets: 204 10*3/uL (ref 150–440)
RBC: 3.45 MIL/uL — ABNORMAL LOW (ref 3.80–5.20)
RDW: 17.5 % — ABNORMAL HIGH (ref 11.5–14.5)
WBC: 10.3 10*3/uL (ref 3.6–11.0)

## 2016-12-23 SURGERY — Surgical Case
Anesthesia: Spinal | Wound class: Clean Contaminated

## 2016-12-23 MED ORDER — NALBUPHINE HCL 10 MG/ML IJ SOLN: 5.0000 mg | Freq: Once | INTRAMUSCULAR | Status: DC | PRN

## 2016-12-23 MED ORDER — COCONUT OIL OIL
1.0000 "application " | TOPICAL_OIL | Status: DC | PRN
  Filled 2016-12-23: qty 120

## 2016-12-23 MED ORDER — BUTORPHANOL TARTRATE 1 MG/ML IJ SOLN: 1.0000 mg | INTRAMUSCULAR | Status: DC | PRN

## 2016-12-23 MED ORDER — DIPHENHYDRAMINE HCL 25 MG PO CAPS: 25.0000 mg | ORAL_CAPSULE | Freq: Four times a day (QID) | ORAL | Status: DC | PRN

## 2016-12-23 MED ORDER — MORPHINE SULFATE (PF) 0.5 MG/ML IJ SOLN
INTRAMUSCULAR | Status: DC | PRN
Start: 2016-12-23 — End: 2016-12-23
  Administered 2016-12-23: .1 mg via EPIDURAL

## 2016-12-23 MED ORDER — OXYTOCIN 40 UNITS IN LACTATED RINGERS INFUSION - SIMPLE MED
2.5000 [IU]/h | INTRAVENOUS | Status: DC
Start: 2016-12-23 — End: 2016-12-24
  Filled 2016-12-23: qty 1000

## 2016-12-23 MED ORDER — TETANUS-DIPHTH-ACELL PERTUSSIS 5-2.5-18.5 LF-MCG/0.5 IM SUSP: 0.5000 mL | Freq: Once | INTRAMUSCULAR | Status: DC

## 2016-12-23 MED ORDER — SIMETHICONE 80 MG PO CHEW
80.0000 mg | CHEWABLE_TABLET | ORAL | Status: DC
Start: 2016-12-24 — End: 2016-12-26
  Filled 2016-12-23: qty 1

## 2016-12-23 MED ORDER — NALBUPHINE HCL 10 MG/ML IJ SOLN: 5.0000 mg | INTRAMUSCULAR | Status: DC | PRN

## 2016-12-23 MED ORDER — OXYTOCIN 10 UNIT/ML IJ SOLN
INTRAMUSCULAR | Status: AC
  Filled 2016-12-23: qty 2

## 2016-12-23 MED ORDER — LIDOCAINE HCL (PF) 1 % IJ SOLN: 30.0000 mL | INTRAMUSCULAR | Status: DC | PRN

## 2016-12-23 MED ORDER — SODIUM CHLORIDE 0.9% FLUSH: 3.0000 mL | INTRAVENOUS | Status: DC | PRN

## 2016-12-23 MED ORDER — FENTANYL CITRATE (PF) 100 MCG/2ML IJ SOLN: 25.0000 ug | INTRAMUSCULAR | Status: DC | PRN

## 2016-12-23 MED ORDER — PRENATAL MULTIVITAMIN CH
1.0000 | ORAL_TABLET | Freq: Every day | ORAL | Status: DC
  Administered 2016-12-24 – 2016-12-26 (×3): 1 via ORAL
  Filled 2016-12-23 (×3): qty 1

## 2016-12-23 MED ORDER — BUPIVACAINE HCL (PF) 0.5 % IJ SOLN
INTRAMUSCULAR | Status: DC | PRN
  Administered 2016-12-23: 20 mL

## 2016-12-23 MED ORDER — SIMETHICONE 80 MG PO CHEW
80.0000 mg | CHEWABLE_TABLET | ORAL | Status: DC | PRN
  Administered 2016-12-24: 80 mg via ORAL
  Filled 2016-12-23: qty 1

## 2016-12-23 MED ORDER — OXYTOCIN 10 UNIT/ML IJ SOLN
INTRAMUSCULAR | Status: AC
  Filled 2016-12-23: qty 4

## 2016-12-23 MED ORDER — KETOROLAC TROMETHAMINE 30 MG/ML IJ SOLN
INTRAMUSCULAR | Status: AC
  Administered 2016-12-23: 30 mg via INTRAVENOUS
  Filled 2016-12-23: qty 1

## 2016-12-23 MED ORDER — BUPIVACAINE 0.25 % ON-Q PUMP DUAL CATH 400 ML
400.0000 mL | INJECTION | Status: DC
  Filled 2016-12-23 (×2): qty 400

## 2016-12-23 MED ORDER — SIMETHICONE 80 MG PO CHEW
80.0000 mg | CHEWABLE_TABLET | Freq: Three times a day (TID) | ORAL | Status: DC
  Administered 2016-12-24 – 2016-12-26 (×8): 80 mg via ORAL
  Filled 2016-12-23 (×8): qty 1

## 2016-12-23 MED ORDER — SUCCINYLCHOLINE CHLORIDE 20 MG/ML IJ SOLN
INTRAMUSCULAR | Status: AC
  Filled 2016-12-23: qty 1

## 2016-12-23 MED ORDER — ACETAMINOPHEN 500 MG PO TABS
1000.0000 mg | ORAL_TABLET | Freq: Four times a day (QID) | ORAL | Status: DC
  Administered 2016-12-23 – 2016-12-24 (×3): 1000 mg via ORAL
  Filled 2016-12-23 (×3): qty 2

## 2016-12-23 MED ORDER — WITCH HAZEL-GLYCERIN EX PADS: 1.0000 "application " | MEDICATED_PAD | CUTANEOUS | Status: DC | PRN

## 2016-12-23 MED ORDER — ACETAMINOPHEN 325 MG PO TABS: 650.0000 mg | ORAL_TABLET | ORAL | Status: DC | PRN

## 2016-12-23 MED ORDER — OXYTOCIN 40 UNITS IN LACTATED RINGERS INFUSION - SIMPLE MED
1.0000 m[IU]/min | INTRAVENOUS | Status: DC
  Administered 2016-12-23: 1 m[IU]/min via INTRAVENOUS
  Administered 2016-12-23: 500 mL via INTRAVENOUS

## 2016-12-23 MED ORDER — PHENYLEPHRINE HCL 10 MG/ML IJ SOLN
INTRAMUSCULAR | Status: AC
  Filled 2016-12-23: qty 1

## 2016-12-23 MED ORDER — KETOROLAC TROMETHAMINE 30 MG/ML IJ SOLN: 30.0000 mg | Freq: Four times a day (QID) | INTRAMUSCULAR | Status: DC | PRN

## 2016-12-23 MED ORDER — KETOROLAC TROMETHAMINE 30 MG/ML IJ SOLN
30.0000 mg | Freq: Four times a day (QID) | INTRAMUSCULAR | Status: DC | PRN
  Administered 2016-12-23: 30 mg via INTRAVENOUS

## 2016-12-23 MED ORDER — LACTATED RINGERS IV SOLN: 500.0000 mL | INTRAVENOUS | Status: DC | PRN

## 2016-12-23 MED ORDER — NALOXONE HCL 0.4 MG/ML IJ SOLN: 0.4000 mg | INTRAMUSCULAR | Status: DC | PRN

## 2016-12-23 MED ORDER — PROPOFOL 10 MG/ML IV BOLUS
INTRAVENOUS | Status: AC
  Filled 2016-12-23: qty 20

## 2016-12-23 MED ORDER — OXYCODONE-ACETAMINOPHEN 5-325 MG PO TABS
1.0000 | ORAL_TABLET | ORAL | Status: DC | PRN
  Administered 2016-12-24 – 2016-12-26 (×3): 1 via ORAL
  Filled 2016-12-23 (×3): qty 1

## 2016-12-23 MED ORDER — OXYTOCIN 40 UNITS IN LACTATED RINGERS INFUSION - SIMPLE MED
2.5000 [IU]/h | INTRAVENOUS | Status: DC
  Administered 2016-12-23: 2.5 [IU]/h via INTRAVENOUS
  Filled 2016-12-23 (×2): qty 1000

## 2016-12-23 MED ORDER — ONDANSETRON HCL 4 MG/2ML IJ SOLN: 4.0000 mg | Freq: Four times a day (QID) | INTRAMUSCULAR | Status: DC | PRN

## 2016-12-23 MED ORDER — SENNOSIDES-DOCUSATE SODIUM 8.6-50 MG PO TABS
2.0000 | ORAL_TABLET | ORAL | Status: DC
  Administered 2016-12-24 – 2016-12-26 (×3): 2 via ORAL
  Filled 2016-12-23 (×4): qty 2

## 2016-12-23 MED ORDER — OXYCODONE HCL 5 MG PO TABS: 5.0000 mg | ORAL_TABLET | Freq: Four times a day (QID) | ORAL | Status: DC | PRN

## 2016-12-23 MED ORDER — PHENYLEPHRINE HCL 10 MG/ML IJ SOLN
INTRAMUSCULAR | Status: DC | PRN
  Administered 2016-12-23: 50 ug via INTRAVENOUS

## 2016-12-23 MED ORDER — LIDOCAINE HCL (PF) 1 % IJ SOLN
INTRAMUSCULAR | Status: AC
  Filled 2016-12-23: qty 30

## 2016-12-23 MED ORDER — ONDANSETRON HCL 4 MG/2ML IJ SOLN
4.0000 mg | Freq: Three times a day (TID) | INTRAMUSCULAR | Status: DC | PRN
  Administered 2016-12-23: 4 mg via INTRAVENOUS
  Filled 2016-12-23: qty 2

## 2016-12-23 MED ORDER — OXYCODONE HCL 5 MG/5ML PO SOLN: 5.0000 mg | Freq: Once | ORAL | Status: DC | PRN

## 2016-12-23 MED ORDER — LACTATED RINGERS IV SOLN
INTRAVENOUS | Status: DC
  Administered 2016-12-23 – 2016-12-24 (×2): via INTRAVENOUS

## 2016-12-23 MED ORDER — CEFAZOLIN SODIUM-DEXTROSE 2-3 GM-% IV SOLR
2.0000 g | Freq: Once | INTRAVENOUS | Status: AC
  Administered 2016-12-23: 2 g via INTRAVENOUS
  Filled 2016-12-23: qty 50

## 2016-12-23 MED ORDER — OXYTOCIN BOLUS FROM INFUSION: 500.0000 mL | Freq: Once | INTRAVENOUS | Status: DC

## 2016-12-23 MED ORDER — AMMONIA AROMATIC IN INHA
RESPIRATORY_TRACT | Status: AC
  Filled 2016-12-23: qty 10

## 2016-12-23 MED ORDER — IBUPROFEN 600 MG PO TABS
600.0000 mg | ORAL_TABLET | Freq: Four times a day (QID) | ORAL | Status: DC
Start: 2016-12-24 — End: 2016-12-26
  Administered 2016-12-23 – 2016-12-26 (×11): 600 mg via ORAL
  Filled 2016-12-23 (×11): qty 1

## 2016-12-23 MED ORDER — OXYCODONE-ACETAMINOPHEN 5-325 MG PO TABS
2.0000 | ORAL_TABLET | ORAL | Status: DC | PRN
  Administered 2016-12-25 – 2016-12-26 (×3): 2 via ORAL
  Filled 2016-12-23 (×3): qty 2

## 2016-12-23 MED ORDER — CEFAZOLIN SODIUM-DEXTROSE 2-4 GM/100ML-% IV SOLN: 2.0000 g | Freq: Once | INTRAVENOUS | Status: DC

## 2016-12-23 MED ORDER — SOD CITRATE-CITRIC ACID 500-334 MG/5ML PO SOLN
ORAL | Status: AC
  Filled 2016-12-23: qty 15

## 2016-12-23 MED ORDER — SOD CITRATE-CITRIC ACID 500-334 MG/5ML PO SOLN
ORAL | Status: AC
  Administered 2016-12-23: 30 mL
  Filled 2016-12-23: qty 15

## 2016-12-23 MED ORDER — MORPHINE SULFATE (PF) 0.5 MG/ML IJ SOLN
INTRAMUSCULAR | Status: AC
  Filled 2016-12-23: qty 10

## 2016-12-23 MED ORDER — MENTHOL 3 MG MT LOZG
1.0000 | LOZENGE | OROMUCOSAL | Status: DC | PRN
  Filled 2016-12-23: qty 9

## 2016-12-23 MED ORDER — FENTANYL CITRATE (PF) 100 MCG/2ML IJ SOLN
INTRAMUSCULAR | Status: AC
  Filled 2016-12-23: qty 2

## 2016-12-23 MED ORDER — LACTATED RINGERS IV SOLN
INTRAVENOUS | Status: DC
  Administered 2016-12-23 (×2): via INTRAVENOUS

## 2016-12-23 MED ORDER — MISOPROSTOL 200 MCG PO TABS
ORAL_TABLET | ORAL | Status: AC
  Filled 2016-12-23: qty 4

## 2016-12-23 MED ORDER — BUPIVACAINE HCL (PF) 0.5 % IJ SOLN
20.0000 mL | Freq: Once | INTRAMUSCULAR | Status: DC
  Filled 2016-12-23: qty 30

## 2016-12-23 MED ORDER — BUPIVACAINE IN DEXTROSE 0.75-8.25 % IT SOLN
INTRATHECAL | Status: DC | PRN
  Administered 2016-12-23: 1.6 mL via INTRATHECAL

## 2016-12-23 MED ORDER — FENTANYL CITRATE (PF) 100 MCG/2ML IJ SOLN
INTRAMUSCULAR | Status: DC | PRN
  Administered 2016-12-23: 15 ug via INTRAVENOUS

## 2016-12-23 MED ORDER — DIBUCAINE 1 % RE OINT: 1.0000 "application " | TOPICAL_OINTMENT | RECTAL | Status: DC | PRN

## 2016-12-23 MED ORDER — OXYCODONE HCL 5 MG PO TABS: 5.0000 mg | ORAL_TABLET | Freq: Once | ORAL | Status: DC | PRN

## 2016-12-23 MED ORDER — NALOXONE HCL 2 MG/2ML IJ SOSY
1.0000 ug/kg/h | PREFILLED_SYRINGE | INTRAMUSCULAR | Status: DC | PRN
  Filled 2016-12-23: qty 2

## 2016-12-23 SURGICAL SUPPLY — 28 items
BAG COUNTER SPONGE EZ (MISCELLANEOUS) ×2 IMPLANT
CANISTER SUCT 3000ML (MISCELLANEOUS) ×3 IMPLANT
CATH KIT ON-Q SILVERSOAK 5IN (CATHETERS) ×6 IMPLANT
CHLORAPREP W/TINT 26ML (MISCELLANEOUS) ×6 IMPLANT
CLOSURE WOUND 1/2 X4 (GAUZE/BANDAGES/DRESSINGS) ×1
COUNTER SPONGE BAG EZ (MISCELLANEOUS) ×1
DRSG OPSITE POSTOP 4X10 (GAUZE/BANDAGES/DRESSINGS) ×6 IMPLANT
DRSG TELFA 3X8 NADH (GAUZE/BANDAGES/DRESSINGS) ×3 IMPLANT
ELECT CAUTERY BLADE 6.4 (BLADE) ×3 IMPLANT
ELECT REM PT RETURN 9FT ADLT (ELECTROSURGICAL) ×3
ELECTRODE REM PT RTRN 9FT ADLT (ELECTROSURGICAL) ×1 IMPLANT
GAUZE SPONGE 4X4 12PLY STRL (GAUZE/BANDAGES/DRESSINGS) ×3 IMPLANT
GLOVE BIO SURGEON STRL SZ7 (GLOVE) ×9 IMPLANT
GLOVE INDICATOR 7.5 STRL GRN (GLOVE) ×9 IMPLANT
GOWN STRL REUS W/ TWL LRG LVL3 (GOWN DISPOSABLE) ×3 IMPLANT
GOWN STRL REUS W/TWL LRG LVL3 (GOWN DISPOSABLE) ×6
LIQUID BAND (GAUZE/BANDAGES/DRESSINGS) ×6 IMPLANT
NS IRRIG 1000ML POUR BTL (IV SOLUTION) ×3 IMPLANT
PACK C SECTION AR (MISCELLANEOUS) ×3 IMPLANT
PAD OB MATERNITY 4.3X12.25 (PERSONAL CARE ITEMS) ×3 IMPLANT
PAD PREP 24X41 OB/GYN DISP (PERSONAL CARE ITEMS) ×3 IMPLANT
STAPLER INSORB 30 2030 C-SECTI (MISCELLANEOUS) ×3 IMPLANT
STRIP CLOSURE SKIN 1/2X4 (GAUZE/BANDAGES/DRESSINGS) ×2 IMPLANT
SUT MNCRL AB 4-0 PS2 18 (SUTURE) ×3 IMPLANT
SUT PDS AB 1 TP1 96 (SUTURE) ×3 IMPLANT
SUT VIC AB 0 CTX 36 (SUTURE) ×4
SUT VIC AB 0 CTX36XBRD ANBCTRL (SUTURE) ×2 IMPLANT
SUT VIC AB 2-0 CT1 36 (SUTURE) ×6 IMPLANT

## 2016-12-23 NOTE — H&P (Signed)
OB History & Physical   History of Present Illness:  Chief Complaint: Induction of labor for postdates and risk of macrosomia  HPI:  Erika Solomon is a 21 y.o. G56P2002 female at [redacted]w[redacted]d dated by LMP.  Her pregnancy has been complicated by close interconceptual spacing, hx of mild preeclampsia, hx of anxiety, panic attacks, depression, hx of shoulder dystocia with macrosomic infant, obesity, uti, anemia.    She denies contractions.   She denies leakage of fluid.   She denies vaginal bleeding.   She reports fetal movement.    Maternal Medical History:   Past Medical History:  Diagnosis Date  . Anxiety and depression   . Hx of preeclampsia, prior pregnancy, currently pregnant     Past Surgical History:  Procedure Laterality Date  . No past surgery      Allergies  Allergen Reactions  . Albuterol Anaphylaxis  . Benadryl [Diphenhydramine] Anaphylaxis  . Buspar [Buspirone] Anaphylaxis  . Strawberry Extract Anaphylaxis    Prior to Admission medications   Medication Sig Start Date End Date Taking? Authorizing Provider  benzonatate (TESSALON PERLES) 100 MG capsule Take 1 capsule (100 mg total) by mouth 3 (three) times daily as needed for cough. 12/17/16   Vena Austria, MD  ferrous sulfate (FERROUSUL) 325 (65 FE) MG tablet Take 1 tablet (325 mg total) by mouth 2 (two) times daily. 12/17/16   Vena Austria, MD  pantoprazole (PROTONIX) 40 MG tablet Take 1 tablet (40 mg total) by mouth 2 (two) times daily. Patient not taking: Reported on 12/01/2016 09/30/16   Hildred Laser, MD  Prenatal Vit-Fe Fumarate-FA (MULTIVITAMIN-PRENATAL) 27-0.8 MG TABS tablet Take 1 tablet by mouth daily at 12 noon.    Historical Provider, MD    OB History  Gravida Para Term Preterm AB Living  3 2 2  0 0 2  SAB TAB Ectopic Multiple Live Births  0 0 0 0 2    # Outcome Date GA Lbr Len/2nd Weight Sex Delivery Anes PTL Lv  3 Current           2 Term 05/22/15 [redacted]w[redacted]d  9 lb 3.1 oz (4.17 kg) M Vag-Spont None  LIV      Complications: Pre-eclampsia  1 Term 06/19/14 100w6d  7 lb 1 oz (3.204 kg) M Vag-Spont  N LIV     Complications: Preeclampsia    Obstetric Comments  Induced at 39 weeks in second pregnancy for pre-eclampsia.     Prenatal care site: [facility]  Social History: She  reports that she has never smoked. She has never used smokeless tobacco. She reports that she does not drink alcohol or use drugs.  Family History: family history includes Diabetes in her maternal grandfather; Hypertension in her father and maternal grandmother.  She has a family of breast cancer  Review of Systems: Negative x 10 systems reviewed except as noted in the HPI.    Physical Exam:  Vital Signs: BP 114/85 (BP Location: Left Arm)   Pulse 91   Temp 98 F (36.7 C) (Oral)   Resp 16   Ht 5' 3.5" (1.613 m) Comment: stated by patient   Wt 228 lb (103.4 kg) Comment: stated by patient  LMP 03/15/2016   BMI 39.75 kg/m  Constitutional: Well nourished, well developed female in no acute distress.  HEENT: normal Skin: Warm and dry.  Cardiovascular: Regular rate and rhythm.   Extremity: mild edema  Respiratory: Clear to auscultation bilateral. Normal respiratory effort Abdomen: FHT present Back: no CVAT Neuro: DTRs  2+, Cranial nerves grossly intact Psych: Alert and Oriented x3. No memory deficits. Normal mood and affect.  MS: normal gait, normal bilateral lower extremity ROM/strength/stability.  Pelvic exam:  is not limited by body habitus EGBUS: within normal limits Vagina: within normal limits and with normal mucosa blood in the vault Cervix: 3 cm   Pertinent Results:  Prenatal Labs: Blood type/Rh A positive  Antibody screen negative  Rubella Not immune  Varicella Not immune    RPR Non-reactive  HBsAg negative  HIV negative  GC negative  Chlamydia negative  Genetic screening 1st trimester neg, msafp neg  1 hour GTT wnl  3 hour GTT NA  GBS negative on 2/19   Baseline FHR: 130 beats/min    Variability: moderate   Accelerations: present   Decelerations: absent Contractions: present frequency: 3-4 Overall assessment: Category I   Assessment:  Erika Solomon is a 21 y.o. 543P2002 female at 8172w3d with induction of labor.   Plan:  1. Admit to Labor & Delivery  2. CBC, T&S, Clrs, IVF 3. GBS negative.   4. Fetal well-being: Category I 5. Expectant management for vaginal delivery, Pitocin induction  Tresea MallJane Clevland Cork, [facility] CenterCNM 12/23/2016 12:34 PM

## 2016-12-23 NOTE — Anesthesia Procedure Notes (Signed)
Spinal  Patient location during procedure: OB Start time: 12/23/2016 4:01 PM End time: 12/23/2016 4:10 PM Staffing Anesthesiologist: Andria Frames Resident/CRNA: Jonna Clark Performed: resident/CRNA  Preanesthetic Checklist Completed: patient identified, site marked, surgical consent, pre-op evaluation, timeout performed, IV checked, risks and benefits discussed and monitors and equipment checked Spinal Block Patient position: sitting Prep: Betadine Patient monitoring: heart rate, continuous pulse ox, blood pressure and cardiac monitor Approach: midline Location: L4-5 Injection technique: single-shot Needle Needle type: Whitacre and Introducer  Needle gauge: 25 G Needle length: 9 cm Additional Notes Negative paresthesia. Negative blood return. Positive free-flowing CSF. Expiration date of kit checked and confirmed. Patient tolerated procedure well, without complications.

## 2016-12-23 NOTE — H&P (Signed)
After consideration of her options and discussion of prior shoulder dystocia, patient opts to proceed with primary LTCS for delivery in order to avoid risk of repeat shoulder dystocia

## 2016-12-23 NOTE — Anesthesia Preprocedure Evaluation (Signed)
Anesthesia Evaluation  Patient identified by MRN, date of birth, ID band Patient awake    Reviewed: Allergy & Precautions, H&P , NPO status , Patient's Chart, lab work & pertinent test results  History of Anesthesia Complications Negative for: history of anesthetic complications  Airway Mallampati: III  TM Distance: <3 FB Neck ROM: full    Dental  (+) Poor Dentition, Chipped   Pulmonary neg pulmonary ROS,    Pulmonary exam normal breath sounds clear to auscultation       Cardiovascular Exercise Tolerance: Good (-) angina(-) Past MI and (-) DOE negative cardio ROS Normal cardiovascular exam Rhythm:regular Rate:Normal     Neuro/Psych PSYCHIATRIC DISORDERS    GI/Hepatic GERD  Controlled,  Endo/Other  Morbid obesity  Renal/GU   negative genitourinary   Musculoskeletal   Abdominal   Peds  Hematology negative hematology ROS (+)   Anesthesia Other Findings Past Medical History: No date: Anxiety and depression No date: Hx of preeclampsia, prior pregnancy, currently*  Past Surgical History: No date: No past surgery  BMI    Body Mass Index:  39.75 kg/m      Reproductive/Obstetrics (+) Pregnancy                             Anesthesia Physical Anesthesia Plan  ASA: III  Anesthesia Plan: Spinal   Post-op Pain Management:    Induction:   Airway Management Planned:   Additional Equipment:   Intra-op Plan:   Post-operative Plan:   Informed Consent: I have reviewed the patients History and Physical, chart, labs and discussed the procedure including the risks, benefits and alternatives for the proposed anesthesia with the patient or authorized representative who has indicated his/her understanding and acceptance.   Dental Advisory Given  Plan Discussed with: Anesthesiologist  Anesthesia Plan Comments:         Anesthesia Quick Evaluation

## 2016-12-23 NOTE — Op Note (Signed)
Preoperative Diagnosis: 1) 21 y.o. [id number] at 353w3d 2) History of macrosomia with shoulder dystocia of 9lbs 3oz infant 3) Suspected fetal macrosomia this pregnancy  Postoperative Diagnosis: 1) 21 y.o. [id number] at 813w3d 2) 2) History of macrosomia with shoulder dystocia of 9lbs 3oz infant 3) Fetal macrosomia this pregnancy  Operation Performed: Primary low transverse C-section via pfannenstiel skin incision  Indication: History of prior shoulder dystocia  Anesthesia: Spinal  Primary Surgeon: Vena AustriaAndreas Braxdon Gappa, MD  Preoperative Antibiotics: 2g ancef  Estimated Blood Loss: 600mL  IV Fluids: 1300mL  Urine Output::12950mL  Drains or Tubes: Foley to gravity drainage, ON-Q catheter system  Implants: none  Specimens Removed: none  Complications: none  Intraoperative Findings:  Normal tubes ovaries and uterus.  Delivery resulted in the birth of a liveborn female, APGAR (1 MIN): 8, APGAR (5 MINS): 9, weight 10lbs 4oz  Patient Condition: stable  Procedure in Detail:  Patient was taken to the operating room were she was administered regional anesthesia.  She was positioned in the supine position, prepped and draped in the  Usual sterile fashion.  Prior to proceeding with the case a time out was performed and the level of anesthetic was checked and noted to be adequate.  Utilizing the scalpel a pfannenstiel skin incision was made 2cm above the pubic symphysis and carried down sharply to the the level of the rectus fascia.  The fascia was incised in the midline using the scalpel and then extended using mayo scissors.  The superior border of the rectus fascia was grasped with two Kocher clamps and the underlying rectus muscles were dissected of the fascia using blunt dissection.  The median raphae was incised using Mayo scissors.   The inferior border of the rectus fascia was dissected of the rectus muscles in a similar fashion.  The midline was identified, the peritoneum was entered bluntly and  expanded using manual tractions.  The uterus was noted to be in a none rotated position.  Next the bladder blade was placed retracting the bladder caudally.  A bladder flap was not created.  A low transverse incision was scored on the lower uterine segment.  The hysterotomy was entered bluntly using the operators finger.  The hysterotomy incision was extended using manual traction.  The operators hand was placed within the hysterotomy position noting the fetus to be within the OA position.  The vertex was grasped, flexed, brought to the incision, and delivered a traumatically using fundal pressure.  The remainder of the body delivered with ease.  The infant was suctioned, cord was clamped and cut before handing off to the awaiting neonatologist.  The placenta was delivered using manual extraction.  The uterus was exteriorized, wiped clean of clots and debris using two moist laps.  The hysterotomy was closed using a two layer closure of 0 Vicryl, with the first being a running locked, the second a vertical imbricating.  The uterus was returned to the abdomen.  The peritoneal gutters were wiped clean of clots and debris using two moist laps.  The hysterotomy incision was re-inspected noted to be hemostatic.  The rectus muscles were re-approximated in the midline using a single 2-0 Vicryl mattress stitch.  The rectus muscles were inspected noted to be hemostatic.  The superior border of the rectus fascia was grasped with a Kocher clamp.  The ON-Q trocars were then placed 4cm above the superior border of the incision and tunneled subfascially.  The introducers were removed and the catheters were threaded through the sleeves after  which the sleeves were removed.  The fascia was closed using a looped #1 PDS in a running fashion taking 1cm by 1cm bites.  The subcutaneous tissue was irrigated using warm saline, hemostasis achieved using the bovie.  The subcutaneous dead space was less 3cm and was not closed.  The skin was  closed using insorb staples.  Sponge needle and instrument counts were corrects times two.  The patient tolerated the procedure well and was taken to the recovery room in stable condition.

## 2016-12-23 NOTE — Transfer of Care (Signed)
Immediate Anesthesia Transfer of Care Note  Patient: Erika Solomon  Procedure(s) Performed: Procedure(s): CESAREAN SECTION (N/A)  Patient Location: PACU  Anesthesia Type:Spinal  Level of Consciousness: awake, alert  and oriented  Airway & Oxygen Therapy: Patient Spontanous Breathing  Post-op Assessment: Report given to RN and Post -op Vital signs reviewed and stable  Post vital signs: Reviewed and stable  Last Vitals:  Vitals:   12/23/16 1534 12/23/16 1713  BP: 107/87 113/65  Pulse: 83 72  Resp: 18 16  Temp: 36.7 C 36.4 C    Last Pain:  Vitals:   12/23/16 1713  TempSrc: Oral         Complications: No apparent anesthesia complications

## 2016-12-23 NOTE — Progress Notes (Signed)
G3P2 presents at 1548w3d for induction of labor for macrosomia. Pitocin induction orders implemented.

## 2016-12-23 NOTE — Anesthesia Post-op Follow-up Note (Cosign Needed)
Anesthesia QCDR form completed.        

## 2016-12-24 ENCOUNTER — Encounter: Payer: Self-pay | Admitting: Obstetrics and Gynecology

## 2016-12-24 LAB — CBC
HCT: 20.1 % — ABNORMAL LOW (ref 35.0–47.0)
Hemoglobin: 6.5 g/dL — ABNORMAL LOW (ref 12.0–16.0)
MCH: 24.7 pg — ABNORMAL LOW (ref 26.0–34.0)
MCHC: 32.6 g/dL (ref 32.0–36.0)
MCV: 75.6 fL — ABNORMAL LOW (ref 80.0–100.0)
Platelets: 162 10*3/uL (ref 150–440)
RBC: 2.65 MIL/uL — ABNORMAL LOW (ref 3.80–5.20)
RDW: 17.4 % — ABNORMAL HIGH (ref 11.5–14.5)
WBC: 8.1 10*3/uL (ref 3.6–11.0)

## 2016-12-24 LAB — RPR: RPR Ser Ql: NONREACTIVE

## 2016-12-24 MED ORDER — FERROUS SULFATE 325 (65 FE) MG PO TABS
325.0000 mg | ORAL_TABLET | Freq: Three times a day (TID) | ORAL | Status: DC
  Administered 2016-12-24 – 2016-12-26 (×7): 325 mg via ORAL
  Filled 2016-12-24 (×7): qty 1

## 2016-12-24 NOTE — Lactation Note (Signed)
This note was copied from a baby's chart. Lactation Consultation Note  Patient Name: Erika Venetia ConstableDonna Kuhar [id number]'s Date: 12/24/2016 Reason for consult: Initial assessment Bottle fed yesterday but today wants to try breastfeeding, did not breastfeed other 2 children, baby eager to latch, mom shown how to shape breast and flange lips to get deeper latch, baby tends to latch to end of nipple, nursed 30 min on right side and is latched and nursing well on left side.  Maternal Data Does the patient have breastfeeding experience prior to this delivery?: No  Feeding Feeding Type: Breast Milk Length of feed: 30 min (30 min. right breast)  LATCH Score/Interventions Latch: Grasps breast easily, tongue down, lips flanged, rhythmical sucking. (aggressive and latches to tip of nipple,manual flanging lip ) Intervention(s): Adjust position;Assist with latch  Audible Swallowing: Spontaneous and intermittent Intervention(s): Skin to skin  Type of Nipple: Everted at rest and after stimulation  Comfort (Breast/Nipple): Filling, red/small blisters or bruises, mild/mod discomfort  Problem noted: Mild/Moderate discomfort Interventions (Mild/moderate discomfort): Hand expression (coconut oil)  Hold (Positioning): Assistance needed to correctly position infant at breast and maintain latch. Intervention(s): Support Pillows;Position options  LATCH Score: 8  Lactation Tools Discussed/Used WIC Program: Yes   Consult Status Consult Status: Follow-up Date: 12/25/16 Follow-up type: In-patient    Erika Solomon 12/24/2016, 3:38 PM

## 2016-12-24 NOTE — Anesthesia Post-op Follow-up Note (Signed)
  Anesthesia Pain Follow-up Note  Patient: Alois ClicheDonna M Hepler  Day #: 1  Date of Follow-up: 12/24/2016 Time: 7:51 AM  Last Vitals:  Vitals:   12/23/16 2303 12/24/16 0300  BP: 122/68 (!) 112/52  Pulse: 85 83  Resp: 18 18  Temp: 36.6 C 36.7 C    Level of Consciousness: alert  Pain: none   Side Effects:None  Catheter Site Exam:clean, dry, no drainage     Plan: D/C from anesthesia care at surgeon's request  Karoline Caldwelleana Moishy Laday

## 2016-12-24 NOTE — Anesthesia Postprocedure Evaluation (Signed)
Anesthesia Post Note  Patient: Erika Solomon  Procedure(s) Performed: Procedure(s) (LRB): CESAREAN SECTION (N/A)  Patient location during evaluation: Mother Baby Anesthesia Type: Spinal Level of consciousness: awake, awake and alert and oriented Pain management: pain level controlled Vital Signs Assessment: post-procedure vital signs reviewed and stable Respiratory status: spontaneous breathing Cardiovascular status: blood pressure returned to baseline Postop Assessment: no headache, no backache, no signs of nausea or vomiting and adequate PO intake Anesthetic complications: no     Last Vitals:  Vitals:   12/23/16 2303 12/24/16 0300  BP: 122/68 (!) 112/52  Pulse: 85 83  Resp: 18 18  Temp: 36.6 C 36.7 C    Last Pain:  Vitals:   12/24/16 0300  TempSrc: Oral  PainSc:                  Karoline Caldwelleana Soriah Leeman

## 2016-12-24 NOTE — Progress Notes (Signed)
  Subjective:   Doing well Post Op Day 1. She is tolerating PO intake and her pain is well tolerated with PO medications and On Q pump. She still has foley catheter in place and has not yet been out of bed to ambulate. She denies s/s of Fe def anemia. Fe sulfate supplement ordered.   Objective:  Blood pressure 109/67, pulse 82, temperature 98.6 F (37 C), temperature source Oral, resp. rate 14, height 5' 3.5" (1.613 m), weight 228 lb (103.4 kg), last menstrual period 03/15/2016, SpO2 98 %, breastfeeding.  General: NAD Pulmonary: no increased work of breathing Abdomen: non-distended, non-tender, fundus firm at level of umbilicus Incision: Waffle dressing and OnQ pump intact. Two small areas on incision dressing with blood drainage. No active bleeding.  Extremities: no edema, no erythema, no tenderness  Results for orders placed or performed during the hospital encounter of 12/23/16 (from the past 24 hour(s))  CBC     Status: Abnormal   Collection Time: 12/24/16  4:53 AM  Result Value Ref Range   WBC 8.1 3.6 - 11.0 K/uL   RBC 2.65 (L) 3.80 - 5.20 MIL/uL   Hemoglobin 6.5 (L) 12.0 - 16.0 g/dL   HCT 16.120.1 (L) 09.635.0 - 04.547.0 %   MCV 75.6 (L) 80.0 - 100.0 fL   MCH 24.7 (L) 26.0 - 34.0 pg   MCHC 32.6 32.0 - 36.0 g/dL   RDW 40.917.4 (H) 81.111.5 - 91.414.5 %   Platelets 162 150 - 440 K/uL    Intake/Output Summary (Last 24 hours) at 12/24/16 1046 Last data filed at 12/24/16 0800  Gross per 24 hour  Intake          2827.62 ml  Output             1350 ml  Net          1477.62 ml     Assessment:   21 y.o. [id number] postoperativeday # 1   Plan:  1) Acute blood loss anemia - hemodynamically stable and asymptomatic - po ferrous sulfate  2) A positive, Rubella Non-immune, Varicella Non-immune- offer vaccinations prior to discharge  3) TDAP status: UTD  4) Breast/Bottle/Contraception: plans Nexplanon  5) Disposition: Discharge to home possible day 2 or 3   Tresea MallJane Raena Pau, Pennsylvania[place] IslandCNM

## 2016-12-25 LAB — CBC
HCT: 25.1 % — ABNORMAL LOW (ref 35.0–47.0)
Hemoglobin: 8.4 g/dL — ABNORMAL LOW (ref 12.0–16.0)
MCH: 26.5 pg (ref 26.0–34.0)
MCHC: 33.5 g/dL (ref 32.0–36.0)
MCV: 79.1 fL — ABNORMAL LOW (ref 80.0–100.0)
Platelets: 188 10*3/uL (ref 150–440)
RBC: 3.17 MIL/uL — ABNORMAL LOW (ref 3.80–5.20)
RDW: 18.5 % — ABNORMAL HIGH (ref 11.5–14.5)
WBC: 9.3 10*3/uL (ref 3.6–11.0)

## 2016-12-25 LAB — PREPARE RBC (CROSSMATCH)

## 2016-12-25 LAB — CHLAMYDIA/NGC RT PCR (ARMC ONLY)
Chlamydia Tr: NOT DETECTED
N gonorrhoeae: NOT DETECTED

## 2016-12-25 MED ORDER — SODIUM CHLORIDE 0.9 % IV SOLN
Freq: Once | INTRAVENOUS | Status: AC
  Administered 2016-12-25: 11:00:00 via INTRAVENOUS

## 2016-12-25 MED ORDER — ACETAMINOPHEN 325 MG PO TABS: 650.0000 mg | ORAL_TABLET | Freq: Once | ORAL | Status: DC

## 2016-12-25 NOTE — Progress Notes (Signed)
Verbal order received form C.Gutierrez CNM at 1046 to obtain post blood H &H 4 hrs after 2nd unit of blood transfusion or 2 hrs after 1st unit if lab requires H&H before staring 2nd unit.

## 2016-12-25 NOTE — Progress Notes (Signed)
Subjective:   Felt lightheaded sitting up this Am eating. Passing flatus. Baby breast and bottle feeding-mostly bottle feeding. Voiding OK. Bleeding slowing.  Objective:  Blood pressure 126/71, pulse 78, temperature 97.8 F (36.6 C), temperature source Oral, resp. rate 18, height 5' 3.5" (1.613 m), weight 103.4 kg (228 lb), last menstrual period 03/15/2016, SpO2 100 %, unknown if currently breastfeeding.  General: NAD, pale, WF Pulmonary: no increased work of breathing, CTAB Heart: RRR without murmur Abdomen: active BS x4, non-distended, non-tender, fundus firm at level of umbilicus Incision: honey comb dressing C+D+I, ON Q intact Extremities: pedal edema, no erythema, no tenderness  Results for orders placed or performed during the hospital encounter of 12/23/16 (from the past 72 hour(s))  CBC     Status: Abnormal   Collection Time: 12/23/16  9:29 AM  Result Value Ref Range   WBC 10.3 3.6 - 11.0 K/uL   RBC 3.45 (L) 3.80 - 5.20 MIL/uL   Hemoglobin 8.4 (L) 12.0 - 16.0 g/dL   HCT 16.1 (L) 09.6 - 04.5 %   MCV 75.4 (L) 80.0 - 100.0 fL   MCH 24.4 (L) 26.0 - 34.0 pg   MCHC 32.3 32.0 - 36.0 g/dL   RDW 40.9 (H) 81.1 - 91.4 %   Platelets 204 150 - 440 K/uL  Type and screen [facility]     Status: None   Collection Time: 12/23/16  9:29 AM  Result Value Ref Range   ABO/RH(D) A POS    Antibody Screen NEG    Sample Expiration 12/26/2016   RPR     Status: None   Collection Time: 12/23/16  9:29 AM  Result Value Ref Range   RPR Ser Ql Non Reactive Non Reactive    Comment: (NOTE) Performed At: [facility] [address] [place], [place] [id number] Mila Homer MD [id number]   CBC     Status: Abnormal   Collection Time: 12/24/16  4:53 AM  Result Value Ref Range   WBC 8.1 3.6 - 11.0 K/uL   RBC 2.65 (L) 3.80 - 5.20 MIL/uL   Hemoglobin 6.5 (L) 12.0 - 16.0 g/dL   HCT 52.8 (L) 41.3 - 24.4 %   MCV 75.6 (L) 80.0 - 100.0 fL   MCH 24.7 (L) 26.0 - 34.0 pg    MCHC 32.6 32.0 - 36.0 g/dL   RDW 01.0 (H) 27.2 - 53.6 %   Platelets 162 150 - 440 K/uL  Chlamydia/NGC rt PCR ([facility] only)     Status: None   Collection Time: 12/25/16  5:50 AM  Result Value Ref Range   Specimen source GC/Chlam URINE, RANDOM    Chlamydia Tr NOT DETECTED NOT DETECTED   N gonorrhoeae NOT DETECTED NOT DETECTED    Comment: (NOTE) 100  This methodology has not been evaluated in pregnant women or in 200  patients with a history of hysterectomy. 300 400  This methodology will not be performed on patients less than 69  years of age.      Assessment:   21 y.o. [id number] postoperativeday # 2   Plan:  1) Chronic iron deficiency anemia, worsening after blood loss with surgery, now symptomatic - - discussed concerns for going home with this severe anemia and increased risk of syncope, and headaches. Concerns for problems with wound healing with severe anemia. Patient agrees with blood transfusion. She has signed consent for blood transfusion on admission.  2)A POS/ RNI/ VNI  3) TDAP UTD  4) Breast/Bottle/Contraception (Nexplanon)  5)  Disposition-probable discharge tomorrow  Farrel Connersolleen Malasia Torain, CNM

## 2016-12-26 ENCOUNTER — Encounter: Payer: Self-pay | Admitting: *Deleted

## 2016-12-26 DIAGNOSIS — D509 Iron deficiency anemia, unspecified: Secondary | ICD-10-CM | POA: Diagnosis present

## 2016-12-26 LAB — TYPE AND SCREEN
ABO/RH(D): A POS
Antibody Screen: NEGATIVE
Unit division: 0
Unit division: 0

## 2016-12-26 LAB — BPAM RBC
Blood Product Expiration Date: 201804102359
Blood Product Expiration Date: 201804112359
ISSUE DATE / TIME: 201803241132
ISSUE DATE / TIME: 201803241439
Unit Type and Rh: 6200
Unit Type and Rh: 6200

## 2016-12-26 MED ORDER — CITRANATAL BLOOM 90-1 MG PO TABS: 1.0000 | ORAL_TABLET | Freq: Every day | ORAL | 2 refills | Status: DC

## 2016-12-26 MED ORDER — OXYCODONE-ACETAMINOPHEN 5-325 MG PO TABS: 1.0000 | ORAL_TABLET | ORAL | 0 refills | Status: DC | PRN

## 2016-12-26 MED ORDER — VARICELLA VIRUS VACCINE LIVE 1350 PFU/0.5ML IJ SUSR
0.5000 mL | Freq: Once | INTRAMUSCULAR | Status: DC
  Filled 2016-12-26: qty 0.5

## 2016-12-26 MED ORDER — IBUPROFEN 600 MG PO TABS: 600.0000 mg | ORAL_TABLET | Freq: Four times a day (QID) | ORAL | 1 refills | Status: DC | PRN

## 2016-12-26 MED ORDER — MEASLES, MUMPS & RUBELLA VAC ~~LOC~~ INJ
0.5000 mL | INJECTION | Freq: Once | SUBCUTANEOUS | Status: DC
  Filled 2016-12-26: qty 0.5

## 2016-12-26 NOTE — Discharge Instructions (Signed)
Cesarean Delivery, Care After Refer to this sheet in the next few weeks. These instructions provide you with information about caring for yourself after your procedure. Your health care provider may also give you more specific instructions. Your treatment has been planned according to current medical practices, but problems sometimes occur. Call your health care provider if you have any problems or questions after your procedure. What can I expect after the procedure? After the procedure, it is common to have:  A small amount of blood or clear fluid coming from the incision.  Some redness, swelling, and pain in your incision area.  Some abdominal pain and soreness.  Vaginal bleeding (lochia).  Pelvic cramps.  Fatigue. Follow these instructions at home: Incision care    Follow instructions from your health care provider about how to take care of your incision. Make sure you:  Wash your hands with soap and water before you change your bandage (dressing). If soap and water are not available, use hand sanitizer.  Remove your dressing and your ON Q pump 3/26. You can cover your incision with a dressing or leave it uncovered. Dry incision off after a shower and try to keep the area dry.   Leave stitches (sutures), skin staples, skin glue, or adhesive strips in place. These skin closures may need to stay in place for 2 weeks or longer. If adhesive strip edges start to loosen and curl up, you may trim the loose edges. Do not remove adhesive strips completely unless your health care provider tells you to do that.  Check your incision area every day for signs of infection. Check for:  More redness, swelling, or pain.  More fluid or blood.  Warmth.  Pus or a bad smell.  When you cough or sneeze, hug a pillow. This helps with pain and decreases the chance of your incision opening up (dehiscing). Do this until your incision heals. Medicines   Take over-the-counter and prescription  medicines only as told by your health care provider.  If you were prescribed an antibiotic medicine, take it as told by your health care provider. Do not stop taking the antibiotic until it is finished. Driving   Do not drive or operate heavy machinery while taking prescription pain medicine.  Do not drive for at least the next 2 weeks. Lifestyle   Do not drink alcohol. This is especially important if you are breastfeeding or taking pain medicine.  Do not use tobacco products, including cigarettes, chewing tobacco, or e-cigarettes. If you need help quitting, ask your health care provider. Tobacco can delay wound healing. Eating and drinking   Drink at least 8 eight-ounce glasses of water every day unless told not to by your health care provider. If you breastfeed, you may need to drink more water than this.  Eat high-fiber foods every day. These foods may help prevent or relieve constipation. High-fiber foods include:  Whole grain cereals and breads.  Brown rice.  Beans.  Fresh fruits and vegetables. Activity   Return to your normal activities as told by your health care provider. Ask your health care provider what activities are safe for you.  Rest as much as possible. Try to rest or take a nap while your baby is sleeping.  Do not lift anything that is heavier than your baby or 10 lb (4.5 kg) as told by your health care provider.  Do not have intercourse or use tampons or douche x 6 weeks. Bathing   Do not take baths, swim,  or use a hot tub until your health care provider approves.  You can take showers.   .   Dry off your incison first after taking a shower General instructions   Do not use tampons or douches for 6 weeksWear:  Loose, comfortable clothing.  A supportive and well-fitting bra.  Watch for any blood clots that may pass from your vagina. These may look like clumps of dark red, brown, or black discharge.  Keep your perineum clean and dry as told by your  health care provider.  Wipe from front to back when you use the toilet.  If possible, have someone help you care for your baby and help with household activities for a few days after you leave the hospital.  Keep all follow-up visits for you and your baby as told by your health care provider. This is important. Contact a health care provider if:  You have:  Bad-smelling vaginal discharge.  Difficulty urinating.  Pain when urinating.  A sudden increase or decrease in the frequency of your bowel movements.  More redness, swelling, or pain around your incision.  More fluid or blood coming from your incision.  Pus or a bad smell coming from your incision.  A fever.  A rash.  Little or no interest in activities you used to enjoy.  Questions about caring for yourself or your baby.  Nausea.  Your incision feels warm to the touch.  Your breasts turn red or become painful or hard.  You feel unusually sad or worried.  You vomit.  You pass large blood clots from your vagina. If you pass a blood clot, save it to show to your health care provider. Do not flush blood clots down the toilet without showing your health care provider.  You urinate more than usual.  You are dizzy or light-headed.  You have not breastfed and have not had a menstrual period for 12 weeks after delivery.  You stopped breastfeeding and have not had a menstrual period for 12 weeks after stopping breastfeeding. Get help right away if:  You have:  Pain that does not go away or get better with medicine.  Chest pain.  Difficulty breathing.  Blurred vision or spots in your vision.  Thoughts about hurting yourself or your baby.  New pain in your abdomen or in one of your legs.  A severe headache.  You faint.  You bleed from your vagina so much that you fill two sanitary pads in one hour. This information is not intended to replace advice given to you by your health care provider. Make sure  you discuss any questions you have with your health care provider. Document Released: 06/12/2002 Document Revised: 01/29/2016 Document Reviewed: 08/25/2015 Elsevier Interactive Patient Education  2017 [employer].

## 2016-12-26 NOTE — Progress Notes (Signed)
Discharge instructions reviewed with patient and significant other.  On-Q handout given.  Rx given by midwife.  Questions answered.  Saline lock removed.  Patient wheeled down with baby in lap.

## 2016-12-26 NOTE — Discharge Summary (Signed)
Obstetric Discharge Summary Reason for Admission: cesarean section Prenatal Procedures: NST and ultrasound Intrapartum Procedures: cesarean: low cervical, transverse Postpartum Procedures: transfusion 2 units, Rubella Ig and Varivax Complications-Operative and Postpartum: severe anemia Hemoglobin  Date Value Ref Range Status  12/25/2016 8.4 (L) 12.0 - 16.0 g/dL Final    Comment:    RESULT REPEATED AND VERIFIED   HGB  Date Value Ref Range Status  06/19/2014 12.2 12.0 - 16.0 g/dL Final   HCT  Date Value Ref Range Status  12/25/2016 25.1 (L) 35.0 - 47.0 % Final   Hematocrit  Date Value Ref Range Status  10/01/2016 28.0 (L) 34.0 - 46.6 % Final    Physical Exam: BP 138/87 (BP Location: Right Arm)   Pulse 61   Temp 98 F (36.7 C) (Oral)   Resp 18   Ht 5' 3.5" (1.613 m) Comment: stated by patient   Wt 103.4 kg (228 lb) Comment: stated by patient  LMP 03/15/2016   SpO2 99%   Breastfeeding? Unknown   BMI 39.75 kg/m  General: alert, cooperative and no distress . Passing flatus. No lightheadedness Heart: RRR without murmur Lungs: CTAB Abdomen: soft, obese, BS active Lochia: appropriate Uterine Fundus: firm Incision:honey comb dressing C+D+I, ON Q intact with serosanguinous drainage around catheters DVT Evaluation: No evidence of DVT seen on physical exam.  Discharge Diagnoses: Term Pregnancy-delivered and fetal macrosomia, Severe anemia  Discharge Information: Date: 12/26/2016 Activity: pelvic rest and no lifting >10#. No strenuous activity Diet: routine Medications: Ibuprofen, Percocet and Citranatal Bloom Condition: stable Instructions: refer to instructions in computer Discharge to: home Follow-up Information    Vena AustriaAndreas Staebler, MD. Schedule an appointment as soon as possible for a visit.   Specialty:  Obstetrics and Gynecology Why:  in 7-10 days for incision check Contact information: [address] [place] [phone]        [facility]. Schedule an appointment as soon as possible for a visit in 6 week(s).   Why:  you also need a 6 week follow up appointment; you may make this appointment when you're in the office for your incision check OR you can go ahead and call and make this appointment; please make this appointment with Dr. Bonney AidStaebler also Contact information: [address] [place] [phone]        Contraception: plans Nexplanon  Newborn Data: Live born female  Birth Weight: 10 lb 4 oz (4650 g) APGAR: 8, 9  Home with mother/ Breast.  Erika Solomon 12/26/2016, 1:46 PM

## 2017-01-03 ENCOUNTER — Ambulatory Visit (INDEPENDENT_AMBULATORY_CARE_PROVIDER_SITE_OTHER): Payer: Medicaid Other | Admitting: Obstetrics and Gynecology

## 2017-01-03 VITALS — BP 122/78 | HR 63 | Ht 63.5 in | Wt 200.0 lb

## 2017-01-03 DIAGNOSIS — Z4889 Encounter for other specified surgical aftercare: Secondary | ICD-10-CM

## 2017-01-03 NOTE — Progress Notes (Signed)
      Postoperative Follow-up Patient presents post op from primary cesarean section 2weeks ago for history of shoulder dystocia and macrosomia.  Subjective: Patient reports marked improvement in her preop symptoms. Eating a regular diet without difficulty. Pain is controlled without any medications.  Activity: normal activities of daily living.  Objective: Vitals:   01/03/17 1100  BP: 122/78  Pulse: 63     Assessment: 21 y.o. s/p 1LTCS stable  Plan: Patient has done well after surgery with no apparent complications.  I have discussed the post-operative course to date, and the expected progress moving forward.  The patient understands what complications to be concerned about.  I will see the patient in routine follow up, or sooner if needed.    Activity plan: No heavy lifting >10lbs for 6 weeks  Vena Austria 01/03/2017, 11:58 AM

## 2017-01-03 NOTE — Patient Instructions (Signed)
Postpartum Care After Cesarean Delivery  The period of time right after you deliver your newborn is called the postpartum period.  What kind of medical care will I receive?  · You may continue to receive fluids and medicines through an IV tube inserted into one of your veins.  · You may have small, flexible tube (catheter) draining urine from your bladder into a bag outside of your body. The catheter will be removed as soon as possible.  · You may be given a squirt bottle to use when you go to the bathroom. You may use this until you are comfortable wiping as usual. To use the squirt bottle, follow these steps:  ? Before you urinate, fill the squirt bottle with warm water. The water should be warm. Do not use hot water.  ? After you urinate, while you are sitting on the toilet, use the squirt bottle to rinse the area around your urethra and vaginal opening. This rinses away any urine and blood.  ? You may do this instead of wiping. As you start healing, you may use the squirt bottle before wiping yourself. Make sure to wipe gently.  ? Fill the squirt bottle with clean water every time you use the bathroom.  · You will be given sanitary pads to wear.  · Your incision will be monitored to make sure it is healing properly. You will be told when it is safe for your stitches, staples, or skin adhesive tape to be removed.  What can I expect?  · You may not feel the need to urinate for several hours after delivery.  · You will have some soreness and pain in your abdomen. You may have a small amount of blood or clear fluid coming from your incision.  · If you are breastfeeding, you may have uterine contractions every time you breastfeed for up to several weeks postpartum. Uterine contractions help your uterus return to its normal size.  · It is normal to have vaginal bleeding (lochia) after delivery. The amount and appearance of lochia is often similar to a menstrual period in the first week after delivery. It will  gradually decrease over the next few weeks to a dry, yellow-brown discharge. For most women, lochia stops completely by 6-8 weeks after delivery. Vaginal bleeding can vary from woman to woman.  · Within the first few days after delivery, you may have breast engorgement. This is when your breasts feel heavy, full, and uncomfortable. Your breasts may also throb and feel hard, tightly stretched, warm, and tender. After this occurs, you may have milk leaking from your breasts. Your health care provider can help you relieve discomfort due to breast engorgement. Breast engorgement should go away within a few days.  · You may feel more sad or worried than normal due to hormonal changes after delivery. These feelings should not last more than a few days. If these feelings do not go away after several days, speak with your health care provider.  How should I care for myself?  · Tell your health care provider if you have pain or discomfort.  · Drink enough water to keep your urine clear or pale yellow.  · Wash your hands thoroughly with soap and water for at least 20 seconds after changing your sanitary pads or using the toilet, and before holding or feeding your baby.  · If you are not breastfeeding, avoid touching your breasts a lot. Doing this can make your breasts produce more milk.  · If   you become weak or lightheaded, or you feel like you might faint, ask for help before:  ? Getting out of bed.  ? Showering.  · Change your sanitary pads frequently. Watch for any changes in your flow, such as a sudden increase in volume, a change in color, or the passing of large blood clots. If you pass a blood clot from your vagina, save it to show to your health care provider. Do not flush blood clots down the toilet without having your health care provider look at them.  · Make sure that all your vaccinations are up to date. This can help protect you and your baby from getting certain diseases. You may need to have immunizations done  before you leave the hospital.  · If desired, talk with your health care provider about methods of family planning or birth control (contraception).  How can I start bonding with my baby?  Spending as much time as possible with your baby is very important. During this time, you and your baby can get to know each other and develop a bond. Having your baby stay with you in your room (rooming in) can give you time to get to know your baby. Rooming in can also help you become comfortable caring for your baby. Breastfeeding can also help you bond with your baby.  How can I plan for returning home with my baby?  · Make sure that you have a car seat installed in your vehicle.  ? Your car seat should be checked by a certified car seat installer to make sure that it is installed safely.  ? Make sure that your baby fits into the car seat safely.  · Ask your health care provider any questions you have about caring for yourself or your baby. Make sure that you are able to contact your health care provider with any questions after leaving the hospital.  This information is not intended to replace advice given to you by your health care provider. Make sure you discuss any questions you have with your health care provider.  Document Released: 06/14/2012 Document Revised: 02/23/2016 Document Reviewed: 08/25/2015  Elsevier Interactive Patient Education © 2017 Elsevier Inc.

## 2017-01-11 ENCOUNTER — Encounter: Payer: Medicaid Other | Admitting: Obstetrics & Gynecology

## 2017-01-14 ENCOUNTER — Encounter: Payer: Medicaid Other | Admitting: Obstetrics & Gynecology

## 2017-01-15 ENCOUNTER — Other Ambulatory Visit: Payer: Self-pay | Admitting: Certified Nurse Midwife

## 2017-01-17 ENCOUNTER — Encounter: Payer: Self-pay | Admitting: Obstetrics and Gynecology

## 2017-01-18 ENCOUNTER — Other Ambulatory Visit: Payer: Self-pay | Admitting: Obstetrics and Gynecology

## 2017-01-18 MED ORDER — OXYCODONE-ACETAMINOPHEN 5-325 MG PO TABS: 1.0000 | ORAL_TABLET | ORAL | 0 refills | Status: DC | PRN

## 2017-01-27 ENCOUNTER — Telehealth: Payer: Self-pay | Admitting: Obstetrics and Gynecology

## 2017-01-27 NOTE — Telephone Encounter (Signed)
[facility] aware Dx code for pain medication is Z39.2

## 2017-01-27 NOTE — Telephone Encounter (Signed)
[facility] is calling about an diagnoses code for an prescription sent last week 01/18/17. Please call back.

## 2017-02-02 ENCOUNTER — Encounter: Payer: Self-pay | Admitting: Obstetrics and Gynecology

## 2017-02-02 ENCOUNTER — Ambulatory Visit (INDEPENDENT_AMBULATORY_CARE_PROVIDER_SITE_OTHER): Payer: Medicaid Other | Admitting: Obstetrics and Gynecology

## 2017-02-02 VITALS — BP 110/74 | HR 74 | Ht 63.5 in | Wt 200.0 lb

## 2017-02-02 DIAGNOSIS — Z30017 Encounter for initial prescription of implantable subdermal contraceptive: Secondary | ICD-10-CM | POA: Diagnosis not present

## 2017-02-02 NOTE — Progress Notes (Signed)
Obstetrics & Gynecology Office Visit   Chief Complaint:  Chief Complaint  Patient presents with  . Postpartum Care    C/S on 3/22    History of Present Illness:  Date of delivery: 12/23/2016 Type of delivery: Primary Cesarean section   Pt had the following problems during pregnancy or labor: Emergency or unplanned Cesarean delivery Patient has had what problems since the delivery: depression  Newborn Details:  SINGLETON :  1. Birth weight: 10 lbs., 4 oz.  Maternal Details:  Feeding plan: Breast Post partum depression/anxiety noted None Edinburgh Post-Partum Depression Score: 4 Date of last PAP: 05/20/2016 NIL  Review of Systems: Review of Systems  Gastrointestinal: Negative for abdominal pain.  Psychiatric/Behavioral: Negative for depression, hallucinations, substance abuse and suicidal ideas. The patient is not nervous/anxious and does not have insomnia.     Past Medical History:  Past Medical History:  Diagnosis Date  . Anxiety and depression   . Hx of preeclampsia, prior pregnancy, currently pregnant   . Macrosomia   . Panic disorder   . Shoulder dystocia, delivered     Past Surgical History:  Past Surgical History:  Procedure Laterality Date  . CESAREAN SECTION N/A 12/23/2016   Procedure: CESAREAN SECTION;  Surgeon: Vena Austria, MD;  Location: [facility] ORS;  Service: Obstetrics;  Laterality: N/A;  . No past surgery      Gynecologic History: No LMP recorded.  Obstetric History: [id number]  Family History:  Family History  Problem Relation Age of Onset  . Diabetes Maternal Grandfather   . Hypertension Father   . Hypertension Maternal Grandmother     Social History:  Social History   Social History  . Marital status: Single    Spouse name: N/A  . Number of children: N/A  . 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 Smoking status: Never Smoker  . Smokeless  tobacco: Never Used  . Alcohol use No     Comment: occasional  . Drug use: No  . Sexual activity: No   Other Topics Concern  . Not on file   Social History Narrative  . No narrative on file    Allergies:  Allergies  Allergen Reactions  . Albuterol Anaphylaxis  . Benadryl [Diphenhydramine] Anaphylaxis  . Buspar [Buspirone] Anaphylaxis  . Strawberry Extract Anaphylaxis    Medications: Prior to Admission medications   Medication Sig Start Date End Date Taking? Authorizing Provider  Prenatal-DSS-FeCb-FeGl-FA (CITRANATAL BLOOM) 90-1 MG TABS Take 1 tablet by mouth daily. 12/26/16  Yes Farrel Conners, CNM    Physical Exam Vitals:  Vitals:   02/02/17 1018  BP: 110/74  Pulse: 74   No LMP recorded.  General: NAD HEENT: normocephalic, anicteric Pulmonary: No increased work of breathing  hepatomegaly, splenomegaly or masses palpable. No evidence of hernia , incision D/C/I fully healed Genitourinary:  External: Normal external female genitalia.  Normal urethral meatus, normal  Bartholin's and Skene's glands.    Vagina: Normal vaginal mucosa, no evidence of prolapse.    Cervix: Grossly normal in appearance, no bleeding  Uterus: Non-enlarged, mobile, normal contour.  No CMT  Adnexa: ovaries non-enlarged, no adnexal masses  Rectal: deferred  Lymphatic: no evidence of inguinal lymphadenopathy Extremities: no edema, erythema, or tenderness Neurologic: Grossly intact Psychiatric: mood appropriate, affect full   GYNECOLOGY PROCEDURE NOTE  Patient is a 21 y.o. [id number] presenting for Nexplanon insertion as her desires  means of contraception.  She provided informed consent, signed copy in the chart, time out was performed. Patient is postpartum.  Negative predictive value of 99%-100% - < OR = 7 days from the onset of a normal menstrual cycle - has not had sexual intercourse since the start of the last menstrual cycle - has correctly and consistently used a reliable form of  contraception - < or = 7 days from an induced abortion - is within 4 weeks postpartum - is exclusively breast feeding (>85% of feeds), amenorrheic, and <6 months postpartum   "[place] Selected Practice Recommendations for Contraceptive Use", May 02, 2015  She understands that Nexplanon is a progesterone only therapy, and that patients often patients have irregular and unpredictable vaginal bleeding or amenorrhea. She understands that other side effects are possible related to systemic progesterone, including but not limited to, headaches, breast tenderness, nausea, and irritability. While effective at preventing pregnancy long acting reversible contraceptives do not prevent transmission of sexually transmitted diseases and use of barrier methods for this purpose was discussed. The placement procedure for Nexplanon was reviewed with the patient in detail including risks of nerve injury, infection, bleeding and injury to other muscles or tendons. She understands that the Nexplanon implant is good for 3 years and needs to be removed at the end of that time.  She understands that Nexplanon is an extremely effective option for contraception, with failure rate of <1%. This information is reviewed today and all questions were answered. Informed consent was obtained, both verbally and written.   The patient is healthy and has no contraindications to Implanon use. Urine pregnancy test was performed today and was negative.  Procedure Appropriate time out taken.  Patient placed in dorsal supine with left arm above head, elbow flexed at 90 degrees, arm resting on examination table.  The bicipital grove was palpated and site 8-10cm proximal to the medial epicondyle was indentified . The insertion site was prepped with a two betadine swabs and then injected with 3cc of 1% lidocaine without epinephrine.  Nexplanon removed form sterile blister packaging,  Device confirmed in needle, before inserting full length of needle,  tenting up the skin as the needle was advance.  The drug eluting rod was then deployed by pulling back the slider per the manufactures recommendation.  The implant was palpable by the clinician as well as the patient.  The insertion site covered dressed with a band aid before applying  a kerlex bandage pressure dressing..Minimal blood loss was noted during the procedure.  The patientt tolerated the procedure well.   She was instructed to wear the bandage for 24 hours, call with any signs of infection.  She was given the Implanon card and instructed to have the rod removed in 3 years.  Female chaperone present for pelvic portions of the physical exam  Assessment: 21 y.o. [id number] postpartum visit and nexplanon insertion  Plan: Problem List Items Addressed This Visit    None    Visit Diagnoses    Nexplanon insertion    -  Primary   Encounter for postpartum visit          1) Pap smear - up to date 2) Contraception - Patient opts for Nexplanon for contraception.  She understands that Nexplanon is a progesterone only therapy, and that patients often patients have irregular and unpredictable vaginal bleeding or amenorrhea. She understands that other side effects are possible related to systemic progesterone, including but not limited to, headaches, breast tenderness, nausea, and irritability.  While effective at preventing pregnancy long acting reversible contraceptives do not prevent transmission of sexually transmitted diseases and use of barrier methods for this purpose was discussed.  The placement procedure for Nexplanon was reviewed with the patient in detail including risks of nerve injury, infection, bleeding and injury to other muscles or tendons. She understands that the Nexplanon implant is good for 3 years and needs to be removed at the end of that time.  She understands that Nexplanon is an extremely effective option for contraception, with failure rate of <1%. 3) RTC 1 year for annual  exam

## 2017-02-18 ENCOUNTER — Telehealth: Payer: Self-pay

## 2017-02-18 NOTE — Telephone Encounter (Signed)
Pt calling.  She received nexplanon 02/02/17 and has been bleeding ever since.  She is anemic so this is making her feel really dizzy and weak like she is going to pass out.  She is wondering if there is an option to do with this to maybe stop the bleeding or at least slow it down.  [phone].

## 2017-02-18 NOTE — Telephone Encounter (Signed)
SDJ, AMS is out of the office today. Please advise

## 2017-02-19 NOTE — Telephone Encounter (Signed)
Didn't see this until Saturday night.  So, I thought I'd pass this back your way.

## 2017-02-22 NOTE — Telephone Encounter (Signed)
Pt aware of AMS message. KJ CMA

## 2017-02-22 NOTE — Telephone Encounter (Signed)
It was just placed a few weeks ago so in the first 3 months there really isn't anything else we can do, it's part of the device thinning out the lining of the uterus

## 2017-05-09 ENCOUNTER — Emergency Department
Admission: EM | Admit: 2017-05-09 | Discharge: 2017-05-09 | Disposition: A | Discharge status: Home / Self Care | Payer: Medicaid Other | Attending: Emergency Medicine | Admitting: Emergency Medicine

## 2017-05-09 ENCOUNTER — Encounter: Payer: Self-pay | Admitting: Emergency Medicine

## 2017-05-09 ENCOUNTER — Emergency Department: Payer: Medicaid Other

## 2017-05-09 DIAGNOSIS — Y9301 Activity, walking, marching and hiking: Secondary | ICD-10-CM | POA: Diagnosis not present

## 2017-05-09 DIAGNOSIS — H1131 Conjunctival hemorrhage, right eye: Secondary | ICD-10-CM | POA: Insufficient documentation

## 2017-05-09 DIAGNOSIS — Y929 Unspecified place or not applicable: Secondary | ICD-10-CM | POA: Diagnosis not present

## 2017-05-09 DIAGNOSIS — S2232XA Fracture of one rib, left side, initial encounter for closed fracture: Secondary | ICD-10-CM | POA: Diagnosis not present

## 2017-05-09 DIAGNOSIS — S0990XA Unspecified injury of head, initial encounter: Secondary | ICD-10-CM | POA: Diagnosis present

## 2017-05-09 DIAGNOSIS — Y999 Unspecified external cause status: Secondary | ICD-10-CM | POA: Insufficient documentation

## 2017-05-09 DIAGNOSIS — S0511XA Contusion of eyeball and orbital tissues, right eye, initial encounter: Secondary | ICD-10-CM | POA: Diagnosis not present

## 2017-05-09 DIAGNOSIS — S0011XA Contusion of right eyelid and periocular area, initial encounter: Secondary | ICD-10-CM

## 2017-05-09 DIAGNOSIS — Z79899 Other long term (current) drug therapy: Secondary | ICD-10-CM | POA: Diagnosis not present

## 2017-05-09 DIAGNOSIS — T07XXXA Unspecified multiple injuries, initial encounter: Secondary | ICD-10-CM

## 2017-05-09 MED ORDER — ACETAMINOPHEN 325 MG PO TABS
650.0000 mg | ORAL_TABLET | Freq: Once | ORAL | Status: AC
  Administered 2017-05-09: 650 mg via ORAL
  Filled 2017-05-09: qty 2

## 2017-05-09 MED ORDER — FLUORESCEIN SODIUM 0.6 MG OP STRP
1.0000 | ORAL_STRIP | Freq: Once | OPHTHALMIC | Status: DC
  Filled 2017-05-09: qty 1

## 2017-05-09 MED ORDER — NABUMETONE 750 MG PO TABS: 750.0000 mg | ORAL_TABLET | Freq: Two times a day (BID) | ORAL | 0 refills | Status: DC

## 2017-05-09 MED ORDER — HYDROCODONE-ACETAMINOPHEN 5-325 MG PO TABS: 1.0000 | ORAL_TABLET | Freq: Three times a day (TID) | ORAL | 0 refills | Status: DC | PRN

## 2017-05-09 MED ORDER — CYCLOBENZAPRINE HCL 5 MG PO TABS: 5.0000 mg | ORAL_TABLET | Freq: Three times a day (TID) | ORAL | 0 refills | Status: DC | PRN

## 2017-05-09 MED ORDER — CYCLOBENZAPRINE HCL 10 MG PO TABS
5.0000 mg | ORAL_TABLET | Freq: Once | ORAL | Status: AC
Start: 2017-05-09 — End: 2017-05-09
  Administered 2017-05-09: 5 mg via ORAL
  Filled 2017-05-09: qty 1

## 2017-05-09 NOTE — Discharge Instructions (Signed)
Your exam, CT scans, and x-rays are normal except for a fracture of your left rib. You have a black eye and contusion to your eyeball. Take the prescription meds as directed. Apply ice to the face and ribs to reduce pain and swelling. Follow-up with your provider or [facility] for continued symptoms.
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
## 2017-05-09 NOTE — ED Notes (Signed)
When asked about a ride home pt states her ride is here and in the lobby
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
## 2018-02-16 ENCOUNTER — Encounter: Payer: Self-pay | Admitting: Emergency Medicine

## 2018-02-16 ENCOUNTER — Emergency Department: Payer: Medicaid Other

## 2018-02-16 ENCOUNTER — Other Ambulatory Visit: Payer: Self-pay

## 2018-02-16 ENCOUNTER — Emergency Department
Admission: EM | Admit: 2018-02-16 | Discharge: 2018-02-16 | Disposition: A | Discharge status: Home / Self Care | Payer: Medicaid Other | Attending: Emergency Medicine | Admitting: Emergency Medicine

## 2018-02-16 DIAGNOSIS — N390 Urinary tract infection, site not specified: Secondary | ICD-10-CM | POA: Diagnosis not present

## 2018-02-16 DIAGNOSIS — F329 Major depressive disorder, single episode, unspecified: Secondary | ICD-10-CM | POA: Insufficient documentation

## 2018-02-16 DIAGNOSIS — R1010 Upper abdominal pain, unspecified: Secondary | ICD-10-CM

## 2018-02-16 DIAGNOSIS — K2900 Acute gastritis without bleeding: Secondary | ICD-10-CM | POA: Insufficient documentation

## 2018-02-16 DIAGNOSIS — Z79899 Other long term (current) drug therapy: Secondary | ICD-10-CM | POA: Diagnosis not present

## 2018-02-16 DIAGNOSIS — F419 Anxiety disorder, unspecified: Secondary | ICD-10-CM | POA: Insufficient documentation

## 2018-02-16 LAB — URINALYSIS, COMPLETE (UACMP) WITH MICROSCOPIC
Bilirubin Urine: NEGATIVE
Glucose, UA: NEGATIVE mg/dL
Ketones, ur: NEGATIVE mg/dL
Nitrite: POSITIVE — AB
Protein, ur: 30 mg/dL — AB
Specific Gravity, Urine: 1.016 (ref 1.005–1.030)
pH: 6 (ref 5.0–8.0)

## 2018-02-16 LAB — CBC
HCT: 36.8 % (ref 35.0–47.0)
Hemoglobin: 12.7 g/dL (ref 12.0–16.0)
MCH: 28.8 pg (ref 26.0–34.0)
MCHC: 34.6 g/dL (ref 32.0–36.0)
MCV: 83.3 fL (ref 80.0–100.0)
Platelets: 213 10*3/uL (ref 150–440)
RBC: 4.42 MIL/uL (ref 3.80–5.20)
RDW: 14.5 % (ref 11.5–14.5)
WBC: 7.5 10*3/uL (ref 3.6–11.0)

## 2018-02-16 LAB — COMPREHENSIVE METABOLIC PANEL
ALT: 22 U/L (ref 14–54)
AST: 23 U/L (ref 15–41)
Albumin: 4.4 g/dL (ref 3.5–5.0)
Alkaline Phosphatase: 65 U/L (ref 38–126)
Anion gap: 7 (ref 5–15)
BUN: 14 mg/dL (ref 6–20)
CO2: 23 mmol/L (ref 22–32)
Calcium: 9.1 mg/dL (ref 8.9–10.3)
Chloride: 107 mmol/L (ref 101–111)
Creatinine, Ser: 0.84 mg/dL (ref 0.44–1.00)
GFR calc Af Amer: >60 mL/min (ref >60)
GFR calc non Af Amer: >60 mL/min (ref >60)
Glucose, Bld: 104 mg/dL — ABNORMAL HIGH (ref 65–99)
Potassium: 4 mmol/L (ref 3.5–5.1)
Sodium: 137 mmol/L (ref 135–145)
Total Bilirubin: 0.8 mg/dL (ref 0.3–1.2)
Total Protein: 7.6 g/dL (ref 6.5–8.1)

## 2018-02-16 LAB — LIPASE, BLOOD: Lipase: 31 U/L (ref 11–51)

## 2018-02-16 LAB — POCT PREGNANCY, URINE: Preg Test, Ur: NEGATIVE

## 2018-02-16 MED ORDER — CEPHALEXIN 500 MG PO CAPS: 500.0000 mg | ORAL_CAPSULE | Freq: Two times a day (BID) | ORAL | 0 refills | Status: DC

## 2018-02-16 MED ORDER — OMEPRAZOLE 20 MG PO CPDR: 20.0000 mg | DELAYED_RELEASE_CAPSULE | Freq: Every day | ORAL | 1 refills | Status: DC

## 2018-02-16 NOTE — ED Provider Notes (Signed)
[facility] Emergency Department Provider Note   ____________________________________________    I have reviewed the triage vital signs and the nursing notes.   HISTORY  Chief Complaint Abdominal Pain     HPI Erika Solomon is a 22 y.o. female who presents with complaints of upper abdominal discomfort.  Patient reports intermittent pain in her epigastrium over the last week, currently she feels well and has no complaints.  Does report some dysuria early in the week but that resolved.  No  back pain.  No fevers or chills.  No nausea or vomiting.  No flank pain   Past Medical History:  Diagnosis Date  . Anxiety and depression   . Hx of preeclampsia, prior pregnancy, currently pregnant   . Macrosomia   . Panic disorder   . Shoulder dystocia, delivered     Patient Active Problem List   Diagnosis Date Noted  . Iron deficiency anemia 12/26/2016  . Postpartum care following cesarean delivery 12/26/2016  . Maternal varicella, non-immune 10/05/2016  . Anemia of pregnancy in second trimester 10/05/2016  . UTI in pregnancy, antepartum 05/24/2016    Past Surgical History:  Procedure Laterality Date  . CESAREAN SECTION N/A 12/23/2016   Procedure: CESAREAN SECTION;  Surgeon: Vena Austria, MD;  Location: [facility] ORS;  Service: Obstetrics;  Laterality: N/A;  . No past surgery      Prior to Admission medications   Medication Sig Start Date End Date Taking? Authorizing Provider  cephALEXin (KEFLEX) 500 MG capsule Take 1 capsule (500 mg total) by mouth 2 (two) times daily. 02/16/18   Jene Every, MD  cyclobenzaprine (FLEXERIL) 5 MG tablet Take 1 tablet (5 mg total) by mouth 3 (three) times daily as needed for muscle spasms. 05/09/17   Menshew, Charlesetta Ivory, PA-C  HYDROcodone-acetaminophen (NORCO) 5-325 MG tablet Take 1 tablet by mouth 3 (three) times daily as needed. 05/09/17   Menshew, Charlesetta Ivory, PA-C  nabumetone (RELAFEN) 750 MG tablet Take 1  tablet (750 mg total) by mouth 2 (two) times daily. 05/09/17   Menshew, Charlesetta Ivory, PA-C  omeprazole (PRILOSEC) 20 MG capsule Take 1 capsule (20 mg total) by mouth daily. 02/16/18 02/16/19  Jene Every, MD  Prenatal-DSS-FeCb-FeGl-FA (CITRANATAL BLOOM) 90-1 MG TABS Take 1 tablet by mouth daily. 12/26/16   Farrel Conners, CNM     Allergies Albuterol; Benadryl [diphenhydramine]; Buspar [buspirone]; and Strawberry extract  Family History  Problem Relation Age of Onset  . Diabetes Maternal Grandfather   . Hypertension Father   . Hypertension Maternal Grandmother     Social History Social History   Tobacco Use  . Smoking status: Never Smoker  . Smokeless tobacco: Never Used  Substance Use Topics  . Alcohol use: No    Alcohol/week: 0.0 oz    Comment: occasional  . Drug use: No    Review of Systems  Constitutional: No fever/chills Eyes: No visual changes.  ENT: No sore throat. Cardiovascular: Denies chest pain. Respiratory: Denies shortness of breath. Gastrointestinal: As above, no diarrhea Genitourinary: Dysuria earlier in the week Musculoskeletal: Negative for back pain. Skin: Negative for rash. Neurological: Negative for headaches    ____________________________________________   PHYSICAL EXAM:  VITAL SIGNS: ED Triage Vitals  Enc Vitals Group     BP 02/16/18 0819 126/83     Pulse Rate 02/16/18 0819 74     Resp 02/16/18 0819 16     Temp 02/16/18 0819 98.3 F (36.8 C)     Temp  Source 02/16/18 0819 Oral     SpO2 02/16/18 0819 100 %     Weight 02/16/18 0804 81.6 kg (180 lb)     Height 02/16/18 0804 1.6 m ( )     Head Circumference --      Peak Flow --      Pain Score 02/16/18 0804 3     Pain Loc --      Pain Edu? --      Excl. in GC? --     Constitutional: Alert and oriented. No acute distress. Pleasant and interactive Eyes: Conjunctivae are normal.   Nose: No congestion/rhinnorhea. Mouth/Throat: Mucous membranes are moist.    Cardiovascular:  Normal rate, regular rhythm.   Good peripheral circulation. Respiratory: Normal respiratory effort.  No retractions.  Gastrointestinal: Soft and nontender. No distention.  No CVA tenderness.  Musculoskeletal:  Warm and well perfused Neurologic:  Normal speech and language. No gross focal neurologic deficits are appreciated.  Skin:  Skin is warm, dry and intact. No rash noted. Psychiatric: Mood and affect are normal. Speech and behavior are normal.  ____________________________________________   LABS (all labs ordered are listed, but only abnormal results are displayed)  Labs Reviewed  COMPREHENSIVE METABOLIC PANEL - Abnormal; Notable for the following components:      Result Value   Glucose, Bld 104 (*)    All other components within normal limits  URINALYSIS, COMPLETE (UACMP) WITH MICROSCOPIC - Abnormal; Notable for the following components:   Color, Urine AMBER (*)    APPearance CLOUDY (*)    Hgb urine dipstick LARGE (*)    Protein, ur 30 (*)    Nitrite POSITIVE (*)    Leukocytes, UA MODERATE (*)    Bacteria, UA RARE (*)    All other components within normal limits  LIPASE, BLOOD  CBC  POC URINE PREG, ED  POCT PREGNANCY, URINE   ____________________________________________  EKG  ED ECG REPORT I, Jene Every, the attending physician, personally viewed and interpreted this ECG.  Date: 02/16/2018  Rhythm: normal sinus rhythm QRS Axis: normal Intervals: normal ST/T Wave abnormalities: normal Narrative Interpretation: no evidence of acute ischemia  ____________________________________________  RADIOLOGY  Ultrasound right upper quadrant negative for cholecystitis ____________________________________________   PROCEDURES  Procedure(s) performed: No  Procedures   Critical Care performed: No ____________________________________________   INITIAL IMPRESSION / ASSESSMENT AND PLAN / ED COURSE  Pertinent labs & imaging results that were available during my  care of the patient were reviewed by me and considered in my medical decision making (see chart for details).  Patient well-appearing in no acute distress.  Abdominal exam is reassuring.  Ultrasound negative for gallstones.  Labs significant for urinalysis consistent with UTI, will treat with Keflex.  Lipase LFTs normal.  Suspect mild gastritis in addition to UTI.  Will treat with omeprazole, outpatient follow-up recommended.  Return precautions discussed    ____________________________________________   FINAL CLINICAL IMPRESSION(S) / ED DIAGNOSES  Final diagnoses:  Upper abdominal pain  Acute gastritis without hemorrhage, unspecified gastritis type  Lower urinary tract infectious disease        Note:  This document was prepared using Dragon voice recognition software and may include unintentional dictation errors.    Jene Every, MD 02/16/18 1056

## 2018-02-16 NOTE — ED Triage Notes (Signed)
Upper abd pain for the past few days, radiates to RUQ, has her gallbladder, usually hurts worse after eating. Denies vomiting, has had diarrhea for 3 days. Appears in NAD.

## 2018-02-16 NOTE — ED Notes (Signed)
Pt in NAD at time of departure, VSS, pt ambulatory.

## 2018-05-02 IMAGING — US US OB TRANSVAGINAL
1 series · 13 of 28 positions shown · non-contrast
Comparison: None.

CLINICAL DATA: Lower abdominal pain for 2-3 weeks the pregnancy.
Estimated gestational age by LMP is 4 weeks 3 days . Quantitative
beta HCG is pending but urine pregnancy test is positive.

EXAM:
OBSTETRIC <14 WK US AND TRANSVAGINAL OB US
TECHNIQUE: Both transabdominal and transvaginal ultrasound examinations were
performed for complete evaluation of the gestation as well as the
maternal uterus, adnexal regions, and pelvic cul-de-sac.
Transvaginal technique was performed to assess early pregnancy.

[Series 1: us ob transvaginal · 0.08mm/px · 13 of 68 slices shown]
[im 3/68]
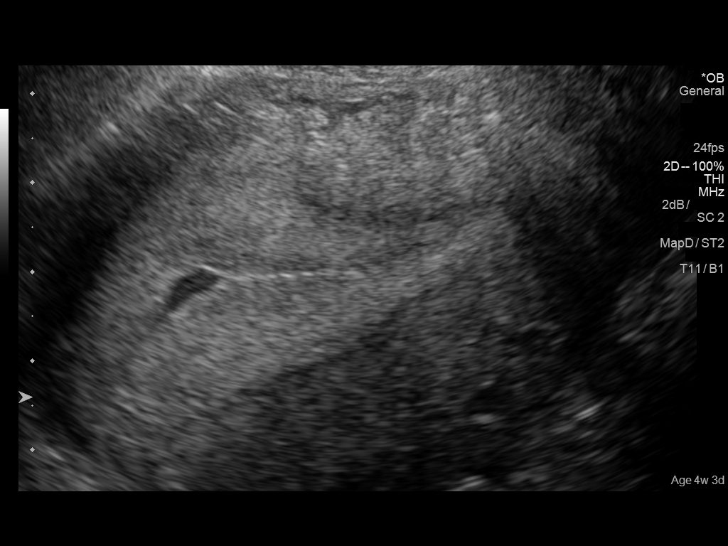
[im 8/68]
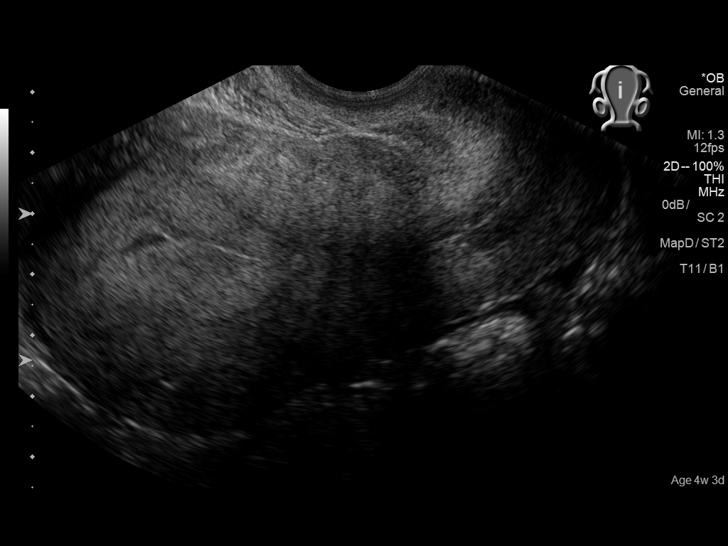
[im 13/68]
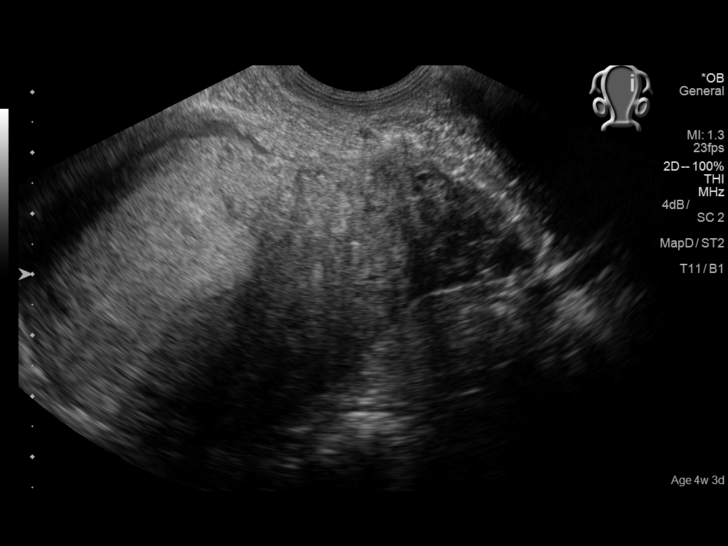
[im 18/68]
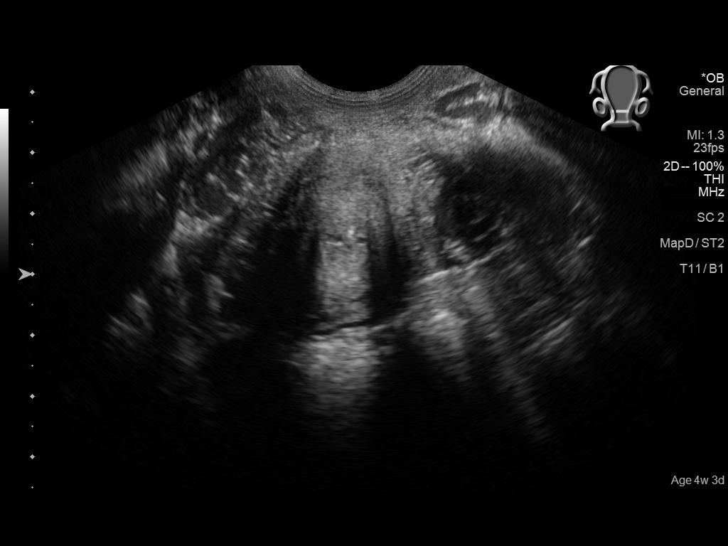
[im 23/68]
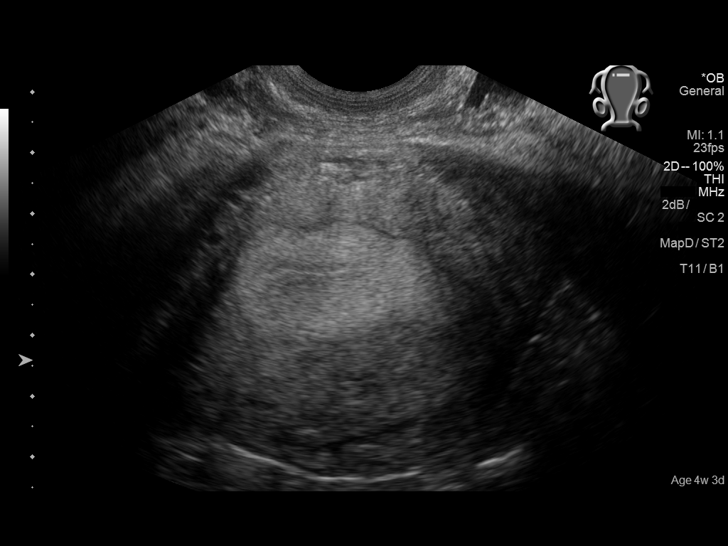
[im 28/68]
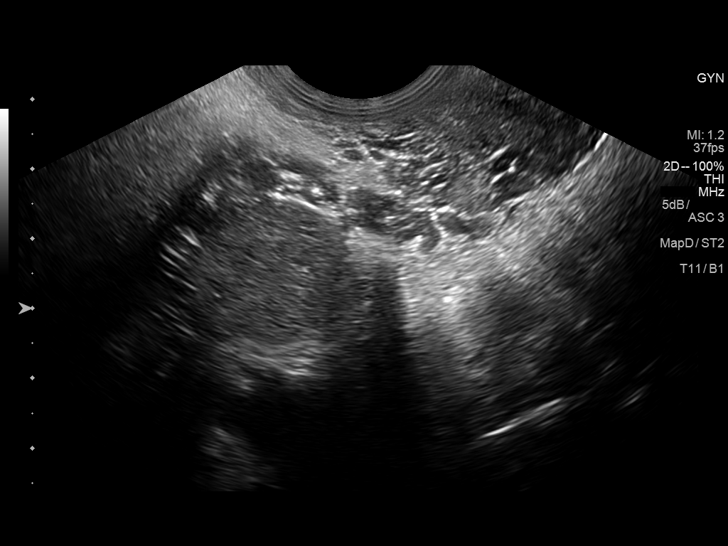
[im 35/68]
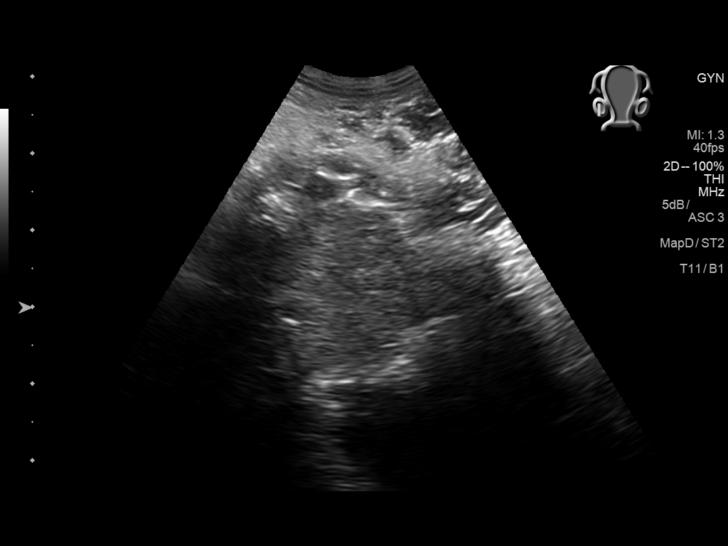
[im 40/68]
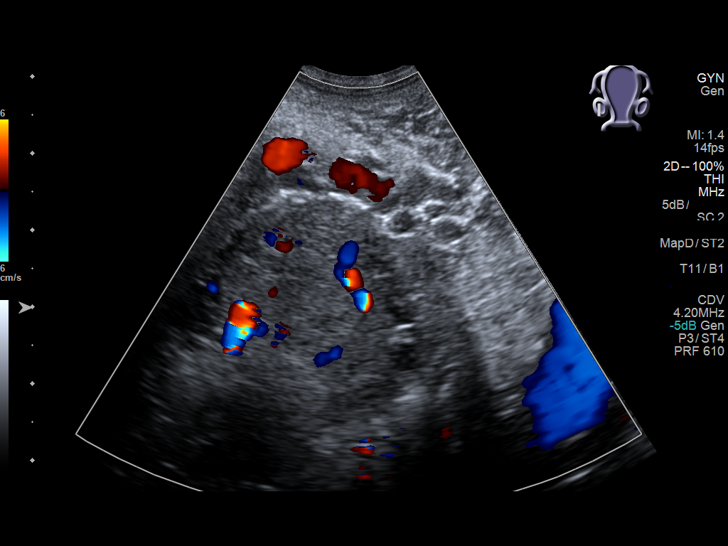
[im 45/68]
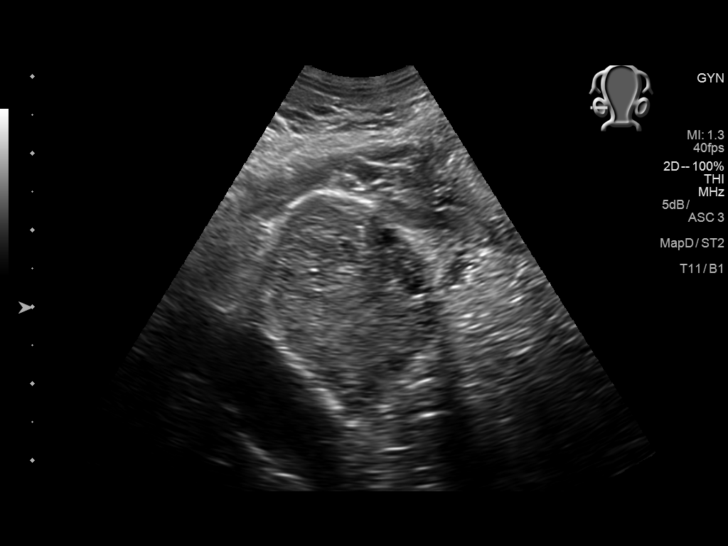
[im 50/68]
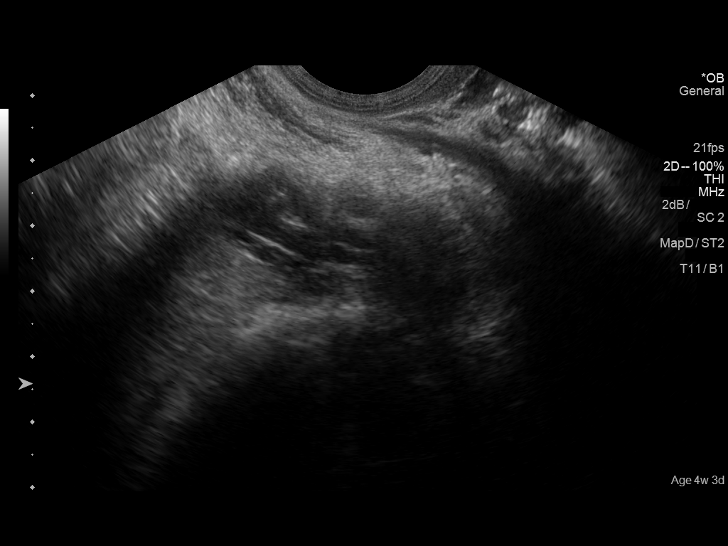
[im 55/68]
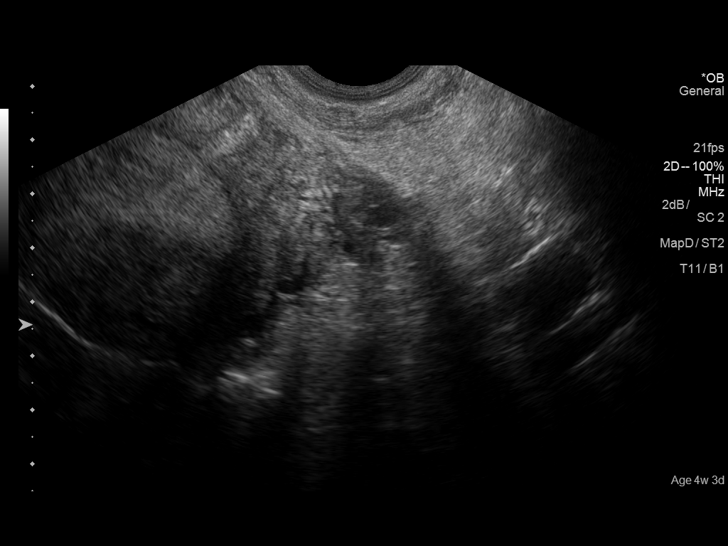
[im 60/68]
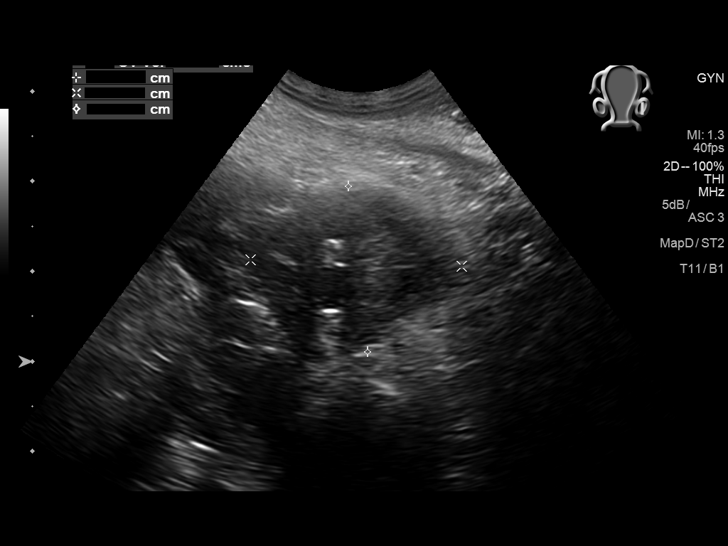
[im 65/68]
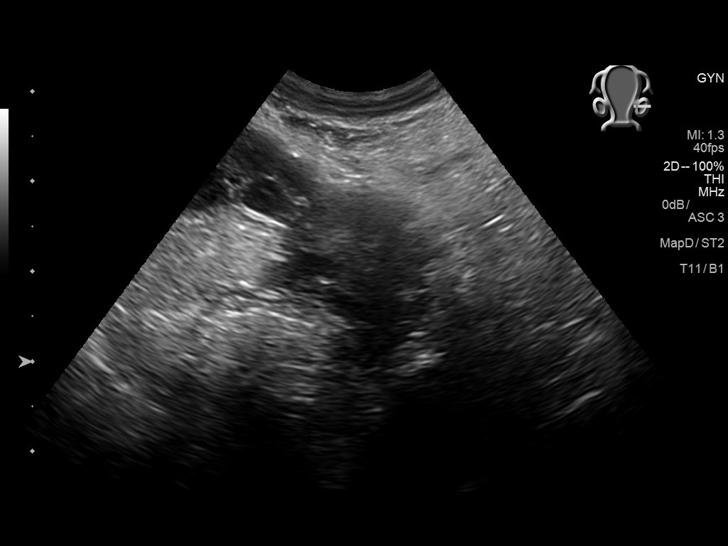

[13 of 28 positions shown; findings below may reference images not displayed]

FINDINGS: Intrauterine gestational sac: No definitive injuring gestational sac
within that. There is a small amount of complex fluid in the
endometrium at the level of the fundus measuring about 6.5 mm
maximal diameter. This is indeterminate but could represent early or
wall gestational sac.

Yolk sac:  Not identified.

Embryo:  Not identified.

Cardiac Activity: Not identified.

The that tear of the indeterminate intrauterine collection: 6 mm 5 w
2 d

Subchorionic hemorrhage:  None visualized.

Maternal uterus/adnexae: Uterus is anteverted. No myometrial mass
lesions identified. Both ovaries are visualized and appear normal.
Presumed corpus luteum cyst on the right ovary measuring 2 cm
maximal diameter.
IMPRESSION: Small complex fluid collection in the endometrium is indeterminate
and could represent loculated fluid or an abnormal/early gestational
sac. No definitive intrauterine gestational sac, yolk sac, or fetal
pole identified. Differential considerations include intrauterine
pregnancy too early to be sonographically visualized, missed
abortion, or ectopic pregnancy. Followup ultrasound is recommended
in 10-14 days for further evaluation.

## 2018-05-08 ENCOUNTER — Emergency Department
Admission: EM | Admit: 2018-05-08 | Discharge: 2018-05-08 | Disposition: A | Discharge status: Home / Self Care | Payer: Medicaid Other | Attending: Emergency Medicine | Admitting: Emergency Medicine

## 2018-05-08 ENCOUNTER — Emergency Department: Payer: Medicaid Other

## 2018-05-08 ENCOUNTER — Encounter: Payer: Self-pay | Admitting: Emergency Medicine

## 2018-05-08 ENCOUNTER — Other Ambulatory Visit: Payer: Self-pay

## 2018-05-08 DIAGNOSIS — R220 Localized swelling, mass and lump, head: Secondary | ICD-10-CM | POA: Diagnosis present

## 2018-05-08 DIAGNOSIS — K047 Periapical abscess without sinus: Secondary | ICD-10-CM

## 2018-05-08 LAB — CBC WITH DIFFERENTIAL/PLATELET
Basophils Absolute: 0 10*3/uL (ref 0–0.1)
Basophils Relative: 0 %
Eosinophils Absolute: 0.1 10*3/uL (ref 0–0.7)
Eosinophils Relative: 1 %
HCT: 36.5 % (ref 35.0–47.0)
Hemoglobin: 12.6 g/dL (ref 12.0–16.0)
Lymphocytes Relative: 11 %
Lymphs Abs: 1 10*3/uL (ref 1.0–3.6)
MCH: 28.9 pg (ref 26.0–34.0)
MCHC: 34.5 g/dL (ref 32.0–36.0)
MCV: 83.9 fL (ref 80.0–100.0)
Monocytes Absolute: 0.8 10*3/uL (ref 0.2–0.9)
Monocytes Relative: 9 %
Neutro Abs: 7.3 10*3/uL — ABNORMAL HIGH (ref 1.4–6.5)
Neutrophils Relative %: 79 %
Platelets: 178 10*3/uL (ref 150–440)
RBC: 4.36 MIL/uL (ref 3.80–5.20)
RDW: 14 % (ref 11.5–14.5)
WBC: 9.3 10*3/uL (ref 3.6–11.0)

## 2018-05-08 LAB — BASIC METABOLIC PANEL
Anion gap: 9 (ref 5–15)
BUN: 14 mg/dL (ref 6–20)
CO2: 23 mmol/L (ref 22–32)
Calcium: 8.9 mg/dL (ref 8.9–10.3)
Chloride: 106 mmol/L (ref 98–111)
Creatinine, Ser: 0.71 mg/dL (ref 0.44–1.00)
GFR calc Af Amer: >60 mL/min (ref >60)
GFR calc non Af Amer: >60 mL/min (ref >60)
Glucose, Bld: 96 mg/dL (ref 70–99)
Potassium: 4.1 mmol/L (ref 3.5–5.1)
Sodium: 138 mmol/L (ref 135–145)

## 2018-05-08 MED ORDER — IBUPROFEN 600 MG PO TABS
600.0000 mg | ORAL_TABLET | Freq: Once | ORAL | Status: AC
  Administered 2018-05-08: 600 mg via ORAL
  Filled 2018-05-08: qty 1

## 2018-05-08 MED ORDER — IOHEXOL 300 MG/ML  SOLN
75.0000 mL | Freq: Once | INTRAMUSCULAR | Status: AC | PRN
  Administered 2018-05-08: 75 mL via INTRAVENOUS

## 2018-05-08 MED ORDER — AMOXICILLIN-POT CLAVULANATE 875-125 MG PO TABS: 1.0000 | ORAL_TABLET | Freq: Two times a day (BID) | ORAL | 0 refills | Status: AC

## 2018-05-08 NOTE — ED Notes (Signed)
.   Pt is resting, Respirations even and unlabored, NAD. Stretcher lowest postion and locked. Call bell within reach. Denies any needs at this time RN will continue to monitor.    

## 2018-05-08 NOTE — ED Notes (Signed)
Pt signed waiver for pregnancy. Pt over to CT at this time.

## 2018-05-08 NOTE — ED Notes (Signed)
Pt presents today with facial swelling to the right side of face. Pt states she has had some dental pain over the last few days but had been taking ibuprofen for it . Pt is in NAD at this time, awaiting EDP.

## 2018-05-08 NOTE — Discharge Instructions (Addendum)
Take the antibiotic as prescribed and finish the full course.  You may continue to take Tylenol or ibuprofen as needed for pain.  Make an appointment to follow-up with your dentist within the next several days.  Return to the ER for new or worsening pain, new or worsening swelling, any difficulty swallowing or breathing, or any other new or worsening symptoms that concern you.

## 2018-05-08 NOTE — ED Notes (Signed)
Pt states she doesn't have to urinate and is requesting to sign the pregnancy waiver. RN notified CT at this time.

## 2018-05-08 NOTE — ED Provider Notes (Signed)
[facility] Emergency Department Provider Note ____________________________________________   First MD Initiated Contact with Patient 05/08/18 1004     (approximate)  I have reviewed the triage vital signs and the nursing notes.   HISTORY  Chief Complaint Facial Swelling    HPI Erika Solomon is a 22 y.o. female with PMH as noted below who presents with right-sided facial swelling, acute onset this morning, and proceeded by a toothache to 1 of her right upper teeth over the last 1 to 2 days.  Patient denies fever or chills, shortness of breath, difficulty swallowing, or vision changes.  No prior history of this.  Patient took Tylenol and ibuprofen for pain with some relief, denies taking any other medications.  Past Medical History:  Diagnosis Date  . Anxiety and depression   . Hx of preeclampsia, prior pregnancy, currently pregnant   . Macrosomia   . Panic disorder   . Shoulder dystocia, delivered     Patient Active Problem List   Diagnosis Date Noted  . Iron deficiency anemia 12/26/2016  . Postpartum care following cesarean delivery 12/26/2016  . Maternal varicella, non-immune 10/05/2016  . Anemia of pregnancy in second trimester 10/05/2016  . UTI in pregnancy, antepartum 05/24/2016    Past Surgical History:  Procedure Laterality Date  . CESAREAN SECTION N/A 12/23/2016   Procedure: CESAREAN SECTION;  Surgeon: Vena Austria, MD;  Location: [facility] ORS;  Service: Obstetrics;  Laterality: N/A;  . No past surgery      Prior to Admission medications   Medication Sig Start Date End Date Taking? Authorizing Provider  amoxicillin-clavulanate (AUGMENTIN) 875-125 MG tablet Take 1 tablet by mouth 2 (two) times daily for 7 days. 05/08/18 05/15/18  Dionne Bucy, MD  cephALEXin (KEFLEX) 500 MG capsule Take 1 capsule (500 mg total) by mouth 2 (two) times daily. Patient not taking: Reported on 05/08/2018 02/16/18   Jene Every, MD  cyclobenzaprine  (FLEXERIL) 5 MG tablet Take 1 tablet (5 mg total) by mouth 3 (three) times daily as needed for muscle spasms. Patient not taking: Reported on 05/08/2018 05/09/17   Menshew, Charlesetta Ivory, PA-C  HYDROcodone-acetaminophen (NORCO) 5-325 MG tablet Take 1 tablet by mouth 3 (three) times daily as needed. Patient not taking: Reported on 05/08/2018 05/09/17   Menshew, Charlesetta Ivory, PA-C  nabumetone (RELAFEN) 750 MG tablet Take 1 tablet (750 mg total) by mouth 2 (two) times daily. Patient not taking: Reported on 05/08/2018 05/09/17   Menshew, Charlesetta Ivory, PA-C  omeprazole (PRILOSEC) 20 MG capsule Take 1 capsule (20 mg total) by mouth daily. Patient not taking: Reported on 05/08/2018 02/16/18 02/16/19  Jene Every, MD  Prenatal-DSS-FeCb-FeGl-FA [facility] BLOOM) 90-1 MG TABS Take 1 tablet by mouth daily. Patient not taking: Reported on 05/08/2018 12/26/16   Farrel Conners, CNM    Allergies Albuterol; Benadryl [diphenhydramine]; Buspar [buspirone]; and Strawberry extract  Family History  Problem Relation Age of Onset  . Diabetes Maternal Grandfather   . Hypertension Father   . Hypertension Maternal Grandmother     Social History Social History   Tobacco Use  . Smoking status: Never Smoker  . Smokeless tobacco: Never Used  Substance Use Topics  . Alcohol use: No    Alcohol/week: 0.0 oz    Comment: occasional  . Drug use: No    Review of Systems  Constitutional: No fever/chills. Eyes: No visual changes. ENT: No sore throat. Cardiovascular: Denies chest pain. Respiratory: Denies shortness of breath. Gastrointestinal: No vomiting. Genitourinary: Negative for  flank pain.  Musculoskeletal: Negative for body aches. Skin: Negative for rash.  Positive for right facial swelling. Neurological: Negative for headache.   ____________________________________________   PHYSICAL EXAM:  VITAL SIGNS: ED Triage Vitals  Enc Vitals Group     BP 05/08/18 0933 127/76     Pulse Rate 05/08/18 0933 82      Resp 05/08/18 0933 16     Temp 05/08/18 0933 99 F (37.2 C)     Temp Source 05/08/18 0933 Oral     SpO2 05/08/18 0933 100 %     Weight 05/08/18 0932 195 lb (88.5 kg)     Height 05/08/18 0932 5\' 3"  (1.6 m)     Head Circumference --      Peak Flow --      Pain Score 05/08/18 0947 7     Pain Loc --      Pain Edu? --      Excl. in GC? --     Constitutional: Alert and oriented.  Relatively well appearing and in no acute distress. Eyes: Conjunctivae are normal.  EOMI.  PERRLA.  Right inferior eyelid swelling. Head: Atraumatic.  Swelling to the right maxillary area with no erythema, induration, or warmth.  No palpable fluctuant mass. Nose: No congestion/rhinnorhea. Mouth/Throat: Mucous membranes are moist.  Oropharynx clear with no swelling.  No stridor.  Mild tenderness to right upper molars, with no fluctuance or palpable abscess. Neck: Normal range of motion.  No lymphadenopathy. Cardiovascular:   Good peripheral circulation. Respiratory: Normal respiratory effort.  Gastrointestinal: No distention.  Musculoskeletal:  Extremities warm and well perfused.  Neurologic: No gross focal neurologic deficits are appreciated.  Skin:  Skin is warm and dry. No rash noted. Psychiatric: Mood and affect are normal. Speech and behavior are normal.  ____________________________________________   LABS (all labs ordered are listed, but only abnormal results are displayed)  Labs Reviewed  CBC WITH DIFFERENTIAL/PLATELET - Abnormal; Notable for the following components:      Result Value   Neutro Abs 7.3 (*)    All other components within normal limits  BASIC METABOLIC PANEL  POC URINE PREG, ED   ____________________________________________  EKG   ____________________________________________  RADIOLOGY  CT maxillofacial: Odontogenic infection related to tooth #6 with 6 mm abscess.  Inflammatory changes right  face.  ____________________________________________   PROCEDURES  Procedure(s) performed: No  Procedures  Critical Care performed: No ____________________________________________   INITIAL IMPRESSION / ASSESSMENT AND PLAN / ED COURSE  Pertinent labs & imaging results that were available during my care of the patient were reviewed by me and considered in my medical decision making (see chart for details).  22 year old female with PMH as noted above presents with acute onset of right facial swelling after developing a toothache in 1 of her right upper teeth over the last 1 to 2 days.  No fever or systemic symptoms.  No shortness of breath or difficulty swallowing.  On exam the patient is relatively well-appearing, her vitals are normal, and there is significant swelling to the right side of the face although no induration, warmth, or fluctuance.  No visible dental abscess.  Overall I suspect most likely reactive edema from a dental infection given that the swelling does not appear consistent with a facial cellulitis.  However I cannot rule out underlying abscess.  We will obtain CT maxillofacial, basic labs, and reassess.  Anticipate discharge home with antibiotics and dental follow-up.  ----------------------------------------- 12:05 PM on 05/08/2018 -----------------------------------------  Lab work-up is unremarkable.  CT shows findings consistent with an odontogenic infection, with a tiny 6 mm abscess.  The facial swelling appears inflammatory and is not consistent with cellulitis or facial abscess.  Patient continues to appear comfortable, and feels well to go home.  I will start the patient on a course of p.o. antibiotics.  She states that she has her own dentist that she can follow-up within the next several days, so instructed her to do so.  Return precautions given, and she expresses understanding.  ____________________________________________   FINAL CLINICAL  IMPRESSION(S) / ED DIAGNOSES  Final diagnoses:  Dental infection  Facial swelling      NEW MEDICATIONS STARTED DURING THIS VISIT:  New Prescriptions   AMOXICILLIN-CLAVULANATE (AUGMENTIN) 875-125 MG TABLET    Take 1 tablet by mouth 2 (two) times daily for 7 days.     Note:  This document was prepared using Dragon voice recognition software and may include unintentional dictation errors.    Dionne BucySiadecki, Kieanna Rollo, MD 05/08/18 [phone]

## 2018-05-08 NOTE — ED Notes (Signed)
.  Labs sent at this time. RN will monitor for results.   

## 2018-05-08 NOTE — ED Triage Notes (Signed)
Pt reports right side dental pain that started about 2 days ago, pt states she woke up today with swelling right side of face and eye.  Pt states she can see out of her right eye, but states eye lashes are irritating.

## 2018-05-20 ENCOUNTER — Encounter: Payer: Self-pay | Admitting: Emergency Medicine

## 2018-05-20 ENCOUNTER — Emergency Department
Admission: EM | Admit: 2018-05-20 | Discharge: 2018-05-20 | Disposition: A | Discharge status: Home / Self Care | Payer: Medicaid Other | Attending: Emergency Medicine | Admitting: Emergency Medicine

## 2018-05-20 DIAGNOSIS — W269XXA Contact with unspecified sharp object(s), initial encounter: Secondary | ICD-10-CM | POA: Diagnosis not present

## 2018-05-20 DIAGNOSIS — Y9389 Activity, other specified: Secondary | ICD-10-CM | POA: Diagnosis not present

## 2018-05-20 DIAGNOSIS — Y999 Unspecified external cause status: Secondary | ICD-10-CM | POA: Diagnosis not present

## 2018-05-20 DIAGNOSIS — Y929 Unspecified place or not applicable: Secondary | ICD-10-CM | POA: Insufficient documentation

## 2018-05-20 DIAGNOSIS — S91114A Laceration without foreign body of right lesser toe(s) without damage to nail, initial encounter: Secondary | ICD-10-CM | POA: Diagnosis not present

## 2018-05-20 DIAGNOSIS — S91311A Laceration without foreign body, right foot, initial encounter: Secondary | ICD-10-CM

## 2018-05-20 MED ORDER — LIDOCAINE HCL (PF) 1 % IJ SOLN
INTRAMUSCULAR | Status: AC
  Filled 2018-05-20: qty 5

## 2018-05-20 MED ORDER — CEPHALEXIN 500 MG PO CAPS: 500.0000 mg | ORAL_CAPSULE | Freq: Three times a day (TID) | ORAL | 0 refills | Status: AC

## 2018-05-20 MED ORDER — LIDOCAINE HCL 1 % IJ SOLN
5.0000 mL | Freq: Once | INTRAMUSCULAR | Status: DC
  Filled 2018-05-20: qty 5

## 2018-05-20 NOTE — ED Triage Notes (Signed)
Patient presents to the ED with a laceration to her second toe on her right foot.  Patient cut toe with something that was in a trash bag.  Patient is unsure of what cut her.  Patient states incident occurred approx. 15 min ago.  Patient is in no obvious distress at this time.

## 2018-05-20 NOTE — ED Provider Notes (Signed)
[facility] Emergency Department Provider Note  ____________________________________________  Time seen: Approximately 6:42 PM  I have reviewed the triage vital signs and the nursing notes.   HISTORY  Chief Complaint Laceration    HPI Erika Solomon is a 22 y.o. female presents to the emergency department with a laceration sustained to the right second toe after patient was taking a bag of trash outside.  Patient is unsure of what caused laceration.  Patient denies weakness or changes in sensation of the right foot.  Patient's tetanus status is updated.  No alleviating measures have been attempted.   Past Medical History:  Diagnosis Date  . Anxiety and depression   . Hx of preeclampsia, prior pregnancy, currently pregnant   . Macrosomia   . Panic disorder   . Shoulder dystocia, delivered     Patient Active Problem List   Diagnosis Date Noted  . Iron deficiency anemia 12/26/2016  . Postpartum care following cesarean delivery 12/26/2016  . Maternal varicella, non-immune 10/05/2016  . Anemia of pregnancy in second trimester 10/05/2016  . UTI in pregnancy, antepartum 05/24/2016    Past Surgical History:  Procedure Laterality Date  . CESAREAN SECTION N/A 12/23/2016   Procedure: CESAREAN SECTION;  Surgeon: Vena AustriaAndreas Staebler, MD;  Location: [facility] ORS;  Service: Obstetrics;  Laterality: N/A;  . No past surgery      Prior to Admission medications   Medication Sig Start Date End Date Taking? Authorizing Provider  cephALEXin (KEFLEX) 500 MG capsule Take 1 capsule (500 mg total) by mouth 3 (three) times daily for 10 days. 05/20/18 05/30/18  Orvil FeilWoods, Aviyon Hocevar M, PA-C  cyclobenzaprine (FLEXERIL) 5 MG tablet Take 1 tablet (5 mg total) by mouth 3 (three) times daily as needed for muscle spasms. Patient not taking: Reported on 05/08/2018 05/09/17   Menshew, Charlesetta IvoryJenise V Bacon, PA-C  HYDROcodone-acetaminophen (NORCO) 5-325 MG tablet Take 1 tablet by mouth 3 (three) times daily  as needed. Patient not taking: Reported on 05/08/2018 05/09/17   Menshew, Charlesetta IvoryJenise V Bacon, PA-C  nabumetone (RELAFEN) 750 MG tablet Take 1 tablet (750 mg total) by mouth 2 (two) times daily. Patient not taking: Reported on 05/08/2018 05/09/17   Menshew, Charlesetta IvoryJenise V Bacon, PA-C  omeprazole (PRILOSEC) 20 MG capsule Take 1 capsule (20 mg total) by mouth daily. Patient not taking: Reported on 05/08/2018 02/16/18 02/16/19  Jene EveryKinner, Robert, MD  Prenatal-DSS-FeCb-FeGl-FA [facility](CITRANATAL BLOOM) 90-1 MG TABS Take 1 tablet by mouth daily. Patient not taking: Reported on 05/08/2018 12/26/16   Farrel ConnersGutierrez, Colleen, CNM    Allergies Albuterol; Benadryl [diphenhydramine]; Buspar [buspirone]; and Strawberry extract  Family History  Problem Relation Age of Onset  . Diabetes Maternal Grandfather   . Hypertension Father   . Hypertension Maternal Grandmother     Social History Social History   Tobacco Use  . Smoking status: Never Smoker  . Smokeless tobacco: Never Used  Substance Use Topics  . Alcohol use: No    Alcohol/week: 0.0 standard drinks    Comment: occasional  . Drug use: No     Review of Systems  Constitutional: No fever/chills Eyes: No visual changes. No discharge ENT: No upper respiratory complaints. Cardiovascular: no chest pain. Respiratory: no cough. No SOB. Gastrointestinal: No abdominal pain.  No nausea, no vomiting.  No diarrhea.  No constipation. Musculoskeletal: Negative for musculoskeletal pain. Skin: Patient has right second toe laceration.  Neurological: Negative for headaches, focal weakness or numbness.   ____________________________________________   PHYSICAL EXAM:  VITAL SIGNS: ED Triage Vitals  Enc Vitals Group     BP 05/20/18 1709 139/86     Pulse Rate 05/20/18 1709 85     Resp 05/20/18 1709 16     Temp 05/20/18 1709 98.7 F (37.1 C)     Temp Source 05/20/18 1709 Oral     SpO2 05/20/18 1709 98 %     Weight 05/20/18 1707 201 lb (91.2 kg)     Height 05/20/18 1707 5' 3.5"  (1.613 m)     Head Circumference --      Peak Flow --      Pain Score 05/20/18 1707 4     Pain Loc --      Pain Edu? --      Excl. in GC? --      Constitutional: Alert and oriented. Well appearing and in no acute distress. Eyes: Conjunctivae are normal. PERRL. EOMI. Head: Atraumatic. Cardiovascular: Normal rate, regular rhythm. Normal S1 and S2.  Good peripheral circulation. Respiratory: Normal respiratory effort without tachypnea or retractions. Lungs CTAB. Good air entry to the bases with no decreased or absent breath sounds. Musculoskeletal: Full range of motion to all extremities. No gross deformities appreciated. Neurologic:  Normal speech and language. No gross focal neurologic deficits are appreciated.  Skin: Patient has 2 cm right second toe laceration.  Psychiatric: Mood and affect are normal. Speech and behavior are normal. Patient exhibits appropriate insight and judgement.   ____________________________________________   LABS (all labs ordered are listed, but only abnormal results are displayed)  Labs Reviewed - No data to display ____________________________________________  EKG   ____________________________________________  RADIOLOGY   No results found.  ____________________________________________    PROCEDURES  Procedure(s) performed:    Procedures  LACERATION REPAIR Performed by: Orvil FeilJaclyn M Gearldene Fiorenza Authorized by: Orvil FeilJaclyn M Kyana Aicher Consent: Verbal consent obtained. Risks and benefits: risks, benefits and alternatives were discussed Consent given by: patient Patient identity confirmed: provided demographic data Prepped and Draped in normal sterile fashion Wound explored  Laceration Location: Right second toe  Laceration Length: 2 cm  No Foreign Bodies seen or palpated  Anesthesia: local infiltration  Local anesthetic: lidocaine 1% without epinephrine  Anesthetic total: 2 ml  Irrigation method: syringe Amount of cleaning:  standard  Skin closure: 4-0 Ethilon   Number of sutures: 5  Technique: Simple Interrupted   Patient tolerance: Patient tolerated the procedure well with no immediate complications.   Medications  lidocaine (XYLOCAINE) 1 % (with pres) injection 5 mL (has no administration in time range)  lidocaine (PF) (XYLOCAINE) 1 % injection (has no administration in time range)     ____________________________________________   INITIAL IMPRESSION / ASSESSMENT AND PLAN / ED COURSE  Pertinent labs & imaging results that were available during my care of the patient were reviewed by me and considered in my medical decision making (see chart for details).  Review of the [place] CSRS was performed in accordance of the [facility] prior to dispensing any controlled drugs.      Assessment and plan Right toe laceration Patient presents to the emergency department with a right second toe laceration sustained today while taking out the trash.  Laceration is very superficial in nature with depth only to the dermis.  Patient's laceration was irrigated with normal saline and patient underwent repair without complication.  She was advised to have sutures removed in 7 days.  Patient was discharged with Keflex.  All patient questions were answered.    ____________________________________________  FINAL CLINICAL IMPRESSION(S) / ED DIAGNOSES  Final diagnoses:  Foot laceration, right, initial encounter      NEW MEDICATIONS STARTED DURING THIS VISIT:  ED Discharge Orders         Ordered    cephALEXin (KEFLEX) 500 MG capsule  3 times daily     05/20/18 1833              This chart was dictated using voice recognition software/Dragon. Despite best efforts to proofread, errors can occur which can change the meaning. Any change was purely unintentional.    Orvil Feil, PA-C 05/20/18 1847    Loleta Rose, MD 05/20/18 2015

## 2018-06-21 ENCOUNTER — Encounter: Payer: Self-pay | Admitting: Emergency Medicine

## 2018-06-21 ENCOUNTER — Other Ambulatory Visit: Payer: Self-pay

## 2018-06-21 ENCOUNTER — Emergency Department
Admission: EM | Admit: 2018-06-21 | Discharge: 2018-06-21 | Disposition: A | Discharge status: Home / Self Care | Payer: No Typology Code available for payment source | Attending: Emergency Medicine | Admitting: Emergency Medicine

## 2018-06-21 DIAGNOSIS — F419 Anxiety disorder, unspecified: Secondary | ICD-10-CM | POA: Insufficient documentation

## 2018-06-21 DIAGNOSIS — Y998 Other external cause status: Secondary | ICD-10-CM | POA: Insufficient documentation

## 2018-06-21 DIAGNOSIS — Y92410 Unspecified street and highway as the place of occurrence of the external cause: Secondary | ICD-10-CM | POA: Insufficient documentation

## 2018-06-21 DIAGNOSIS — Y9389 Activity, other specified: Secondary | ICD-10-CM | POA: Insufficient documentation

## 2018-06-21 DIAGNOSIS — M545 Low back pain: Secondary | ICD-10-CM | POA: Diagnosis present

## 2018-06-21 DIAGNOSIS — F329 Major depressive disorder, single episode, unspecified: Secondary | ICD-10-CM | POA: Diagnosis not present

## 2018-06-21 DIAGNOSIS — M7918 Myalgia, other site: Secondary | ICD-10-CM | POA: Diagnosis not present

## 2018-06-21 MED ORDER — CYCLOBENZAPRINE HCL 10 MG PO TABS: 10.0000 mg | ORAL_TABLET | Freq: Three times a day (TID) | ORAL | 0 refills | Status: DC | PRN

## 2018-06-21 MED ORDER — TRAMADOL HCL 50 MG PO TABS: 50.0000 mg | ORAL_TABLET | Freq: Two times a day (BID) | ORAL | 0 refills | Status: DC | PRN
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
## 2018-06-21 NOTE — ED Notes (Signed)
NAD noted at time of D/C. Pt denies questions or concerns. Pt ambulatory to the lobby at this time.  

## 2018-06-23 ENCOUNTER — Emergency Department
Admission: EM | Admit: 2018-06-23 | Discharge: 2018-06-23 | Disposition: A | Discharge status: Home / Self Care | Payer: No Typology Code available for payment source | Attending: Emergency Medicine | Admitting: Emergency Medicine

## 2018-06-23 ENCOUNTER — Encounter: Payer: Self-pay | Admitting: Emergency Medicine

## 2018-06-23 ENCOUNTER — Other Ambulatory Visit: Payer: Self-pay

## 2018-06-23 DIAGNOSIS — Y998 Other external cause status: Secondary | ICD-10-CM | POA: Insufficient documentation

## 2018-06-23 DIAGNOSIS — T148XXA Other injury of unspecified body region, initial encounter: Secondary | ICD-10-CM | POA: Diagnosis not present

## 2018-06-23 DIAGNOSIS — Y92410 Unspecified street and highway as the place of occurrence of the external cause: Secondary | ICD-10-CM | POA: Insufficient documentation

## 2018-06-23 DIAGNOSIS — Y93I9 Activity, other involving external motion: Secondary | ICD-10-CM | POA: Insufficient documentation

## 2018-06-23 DIAGNOSIS — S299XXA Unspecified injury of thorax, initial encounter: Secondary | ICD-10-CM | POA: Diagnosis present

## 2018-06-23 MED ORDER — KETOROLAC TROMETHAMINE 60 MG/2ML IM SOLN
30.0000 mg | Freq: Once | INTRAMUSCULAR | Status: AC
  Administered 2018-06-23: 30 mg via INTRAMUSCULAR
  Filled 2018-06-23: qty 2
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
## 2018-12-14 ENCOUNTER — Ambulatory Visit: Payer: Self-pay | Admitting: Obstetrics and Gynecology

## 2019-04-24 ENCOUNTER — Ambulatory Visit: Payer: Self-pay | Admitting: Physician Assistant

## 2019-05-02 ENCOUNTER — Other Ambulatory Visit: Payer: Self-pay

## 2019-05-02 ENCOUNTER — Emergency Department
Admission: EM | Admit: 2019-05-02 | Discharge: 2019-05-02 | Disposition: A | Discharge status: Home / Self Care | Payer: Commercial Managed Care - PPO | Attending: Emergency Medicine | Admitting: Emergency Medicine

## 2019-05-02 ENCOUNTER — Emergency Department: Payer: Commercial Managed Care - PPO

## 2019-05-02 ENCOUNTER — Encounter: Payer: Self-pay | Admitting: Emergency Medicine

## 2019-05-02 DIAGNOSIS — W010XXA Fall on same level from slipping, tripping and stumbling without subsequent striking against object, initial encounter: Secondary | ICD-10-CM | POA: Insufficient documentation

## 2019-05-02 DIAGNOSIS — Y9289 Other specified places as the place of occurrence of the external cause: Secondary | ICD-10-CM | POA: Diagnosis not present

## 2019-05-02 DIAGNOSIS — Y9301 Activity, walking, marching and hiking: Secondary | ICD-10-CM | POA: Diagnosis not present

## 2019-05-02 DIAGNOSIS — Y998 Other external cause status: Secondary | ICD-10-CM | POA: Diagnosis not present

## 2019-05-02 DIAGNOSIS — S4991XA Unspecified injury of right shoulder and upper arm, initial encounter: Secondary | ICD-10-CM | POA: Diagnosis present

## 2019-05-02 DIAGNOSIS — W19XXXA Unspecified fall, initial encounter: Secondary | ICD-10-CM

## 2019-05-02 MED ORDER — TRAMADOL HCL 50 MG PO TABS: 50.0000 mg | ORAL_TABLET | Freq: Two times a day (BID) | ORAL | 0 refills | Status: AC | PRN

## 2019-05-02 MED ORDER — IBUPROFEN 600 MG PO TABS: 600.0000 mg | ORAL_TABLET | Freq: Three times a day (TID) | ORAL | 0 refills | Status: DC | PRN

## 2019-05-02 MED ORDER — IBUPROFEN 600 MG PO TABS
600.0000 mg | ORAL_TABLET | Freq: Once | ORAL | Status: AC
  Administered 2019-05-02: 600 mg via ORAL
  Filled 2019-05-02: qty 1

## 2019-05-02 MED ORDER — OXYCODONE-ACETAMINOPHEN 5-325 MG PO TABS
1.0000 | ORAL_TABLET | Freq: Once | ORAL | Status: AC
  Administered 2019-05-02: 12:00:00 1 via ORAL
  Filled 2019-05-02: qty 1

## 2019-05-02 NOTE — Discharge Instructions (Signed)
Wear sling for 3 to 5 days as needed.

## 2019-05-02 NOTE — ED Provider Notes (Signed)
[facility] Emergency Department Provider Note   ____________________________________________   First MD Initiated Contact with Patient 05/02/19 1138     (approximate)  I have reviewed the triage vital signs and the nursing notes.   HISTORY  Chief Complaint Arm Injury    HPI Erika Solomon is a 23 y.o. female patient presents with right upper extremity pain secondary to a fall last night.  Patient stated pain with decreased range of motion of the right upper extremity.  Patient stated the shoulder and forearm was involved.  Patient state pain to the right shoulder with abduction.  Patient also stated pain to the right forearm with supination and pronation.  Patient is right-hand dominant.  Patient rates her pain as a 7/10.  Patient described pain is "achy".  No palliative measures for complaint.         Past Medical History:  Diagnosis Date  . Anxiety and depression   . Hx of preeclampsia, prior pregnancy, currently pregnant   . Macrosomia   . Panic disorder   . Shoulder dystocia, delivered     Patient Active Problem List   Diagnosis Date Noted  . Iron deficiency anemia 12/26/2016  . Postpartum care following cesarean delivery 12/26/2016  . Maternal varicella, non-immune 10/05/2016  . Anemia of pregnancy in second trimester 10/05/2016  . UTI in pregnancy, antepartum 05/24/2016    Past Surgical History:  Procedure Laterality Date  . CESAREAN SECTION N/A 12/23/2016   Procedure: CESAREAN SECTION;  Surgeon: Malachy Mood, MD;  Location: [facility] ORS;  Service: Obstetrics;  Laterality: N/A;  . No past surgery      Prior to Admission medications   Not on File    Allergies Albuterol, Benadryl [diphenhydramine], Buspar [buspirone], and Strawberry extract  Family History  Problem Relation Age of Onset  . Diabetes Maternal Grandfather   . Hypertension Father   . Hypertension Maternal Grandmother     Social History Social History    Tobacco Use  . Smoking status: Never Smoker  . Smokeless tobacco: Never Used  Substance Use Topics  . Alcohol use: No    Alcohol/week: 0.0 standard drinks    Comment: occasional  . Drug use: No    Review of Systems Constitutional: No fever/chills Eyes: No visual changes. ENT: No sore throat. Cardiovascular: Denies chest pain. Respiratory: Denies shortness of breath. Gastrointestinal: No abdominal pain.  No nausea, no vomiting.  No diarrhea.  No constipation. Genitourinary: Negative for dysuria. Musculoskeletal: Right upper extremity pain.   Skin: Negative for rash.  Bruising to right shoulder and forearm. Neurological: Negative for headaches, focal weakness or numbness. Psychiatric:  Anxiety and depression. Allergic/Immunilogical: See allergy list. ____________________________________________   PHYSICAL EXAM:  VITAL SIGNS: ED Triage Vitals  Enc Vitals Group     BP 05/02/19 1126 134/74     Pulse Rate 05/02/19 1126 88     Resp 05/02/19 1126 16     Temp 05/02/19 1126 98.9 F (37.2 C)     Temp Source 05/02/19 1126 Oral     SpO2 05/02/19 1126 98 %     Weight 05/02/19 1127 210 lb (95.3 kg)     Height 05/02/19 1127 5\' 3"  (1.6 m)     Head Circumference --      Peak Flow --      Pain Score 05/02/19 1126 7     Pain Loc --      Pain Edu? --      Excl. in [place]? --  Constitutional: Alert and oriented. Well appearing and in no acute distress. Cardiovascular: Normal rate, regular rhythm. Grossly normal heart sounds.  Good peripheral circulation. Respiratory: Normal respiratory effort.  No retractions. Lungs CTAB. Gastrointestinal: Soft and nontender. No distention. No abdominal bruits. No CVA tenderness. Genitourinary: Deferred Musculoskeletal: No obvious deformity to the right upper extremity.  Patient has moderate guarding palpation of the mid humerus and forearm.  Decreased range of motion secondary to complaint of pain.   Neurologic:  Normal speech and language. No gross  focal neurologic deficits are appreciated. No gait instability. Skin:  Skin is warm, dry and intact. No rash noted.  Abrasion ecchymosis to the right forearm. Psychiatric: Mood and affect are normal. Speech and behavior are normal.  ____________________________________________   LABS (all labs ordered are listed, but only abnormal results are displayed)  Labs Reviewed - No data to display ____________________________________________  EKG   ____________________________________________  RADIOLOGY  ED MD interpretation:    Official radiology report(s): Dg Shoulder Right  Result Date: 05/02/2019 CLINICAL DATA:  Pain following pain fall EXAM: RIGHT SHOULDER - 2+ VIEW COMPARISON:  None. FINDINGS: Oblique, Y scapular, and axillary images obtained. No fracture or dislocation. Joint spaces appear normal. No erosive change. Visualized right lung clear. IMPRESSION: No fracture or dislocation.  No evident arthropathy. Electronically Signed   By: Bretta BangWilliam  Woodruff III M.D.   On: 05/02/2019 12:28   Dg Forearm Right  Result Date: 05/02/2019 CLINICAL DATA:  Pain following fall EXAM: RIGHT FOREARM - 2 VIEW COMPARISON:  None. FINDINGS: Frontal and lateral views were obtained. No fracture or dislocation. Joint spaces appear normal. No erosive change. There is a minus ulnar variance. IMPRESSION: No fracture or dislocation. No evident arthropathy. Minus ulnar variance. Electronically Signed   By: Bretta BangWilliam  Woodruff III M.D.   On: 05/02/2019 12:27    ____________________________________________   PROCEDURES  Procedure(s) performed (including Critical Care):  Procedures   ____________________________________________   INITIAL IMPRESSION / ASSESSMENT AND PLAN / ED COURSE  As part of my medical decision making, I reviewed the following data within the electronic MEDICAL RECORD NUMBER        Erika Solomon was evaluated in Emergency Department on 05/02/2019 for the symptoms described in the history  of present illness. She was evaluated in the context of the global COVID-19 pandemic, which necessitated consideration that the patient might be at risk for infection with the SARS-CoV-2 virus that causes COVID-19. Institutional protocols and algorithms that pertain to the evaluation of patients at risk for COVID-19 are in a state of rapid change based on information released by regulatory bodies including the CDC and federal and state organizations. These policies and algorithms were followed during the patient's care in the ED.   Patient presents with right shoulder forearm pain secondary to fall last night.  Discussed neck x-ray findings with patient.  Patient placed in arm sling for supportive care.  Patient given discharge care instruction advised take medication as directed.  Patient vies follow-up PCP.      ____________________________________________   FINAL CLINICAL IMPRESSION(S) / ED DIAGNOSES  Final diagnoses:  Injury of right upper extremity, initial encounter  Fall, initial encounter     ED Discharge Orders    None       Note:  This document was prepared using Dragon voice recognition software and may include unintentional dictation errors.    Joni ReiningSmith, Ronald K, PA-C 05/02/19 1256    Minna AntisPaduchowski, Kevin, MD 05/02/19 1300

## 2019-05-02 NOTE — ED Notes (Signed)
See triage note  States she slipped down steps yesterday   States her right arm got caught in railing  Swelling and bruising noted to right forearm    And pain to right anterior shoulder  Good pulses  Having increased pain with movement

## 2019-05-02 NOTE — ED Triage Notes (Signed)
Pt in via POV, reports slipping down wet stairs yesterday, presents today with worsening pain to right forearm with radiation up to shoulder.  Pt ambulatory to triage, NAD noted at this time.

## 2019-05-07 ENCOUNTER — Telehealth: Payer: Self-pay | Admitting: Physician Assistant

## 2019-05-07 NOTE — Telephone Encounter (Signed)
Patient has an appt with you on Friday but she was seen in the ER the past Wed. For a fall and got her arm caught in the [place].   She is having problems with her right shoulder since fall and her work [employer]) is not understanding that she is having issues w it and not up to 100 % working.    Wants to be seen before Friday if possible.  [phone]

## 2019-05-07 NOTE — Telephone Encounter (Signed)
Please advise 

## 2019-05-07 NOTE — Telephone Encounter (Signed)
No, I do not have any slots.

## 2019-05-07 NOTE — Telephone Encounter (Signed)
LMOVM for cb. ?
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
## 2019-05-07 NOTE — Telephone Encounter (Signed)
No I cannot write this she is not my patient.

## 2019-05-11 ENCOUNTER — Ambulatory Visit: Payer: Commercial Managed Care - PPO | Admitting: Physician Assistant

## 2019-05-11 ENCOUNTER — Encounter: Payer: Self-pay | Admitting: Physician Assistant

## 2019-05-11 ENCOUNTER — Other Ambulatory Visit: Payer: Self-pay

## 2019-05-11 VITALS — BP 134/85 | HR 87 | Temp 97.9°F | Resp 16 | Ht 64.0 in | Wt 221.2 lb

## 2019-05-11 DIAGNOSIS — M25511 Pain in right shoulder: Secondary | ICD-10-CM

## 2019-05-11 MED ORDER — MELOXICAM 7.5 MG PO TABS: 7.5000 mg | ORAL_TABLET | Freq: Every day | ORAL | 0 refills | Status: DC

## 2019-05-11 NOTE — Progress Notes (Signed)
Patient: Erika Solomon, Female    DOB: 03-05-1996, 23 y.o.   MRN: [id number] Visit Date: 05/11/2019  Today's Provider: Trinna Post, PA-C   Chief Complaint  Patient presents with  . Establish Care   Subjective:     New Patient:  Erika Solomon is a 24 y.o. female who presents today to Establish Care.  She feels well. She reports exercising none. She reports she is sleeping poorly.  She lives in [place] with her three children ages 80, 59 and 2. She is a Chartered certified accountant at [employer].   Last PAP smear by Dr. Marcelline Mates at [facility] in 09/2016. She currently has a nexplanon for birth control and is having irregular bleeding with it.   Right shoulder injury: She slipped on the steps on 05/01/2019 and her right arm got caught between the stair railing. She was seen at Urgent Care via video call and got a work excuse for that day. Was seen in [facility] 05/02/2019 for worsening pain where right shoulder and forearm xrays were normal. She was provided a shoulder sling for immobilization and advised to follow up with PCP. She works in Nurse, mental health for [employer] and for the past week she has been using her left hand for lighter duty like wiping objects down.  ----------------------------------------------   Review of Systems  All other systems reviewed and are negative.   Social History      She  reports that she has never smoked. She has never used smokeless tobacco. She reports that she does not drink alcohol or use drugs.       Social History   Socioeconomic History  . Marital status: Single    Spouse name: Not on file  . Number of children: Not on file  . Years of education: Not on file  . Highest education level: Not on file  Occupational History  . Occupation: scanner    Comment: bothers back too much  Social Needs  . Financial resource strain: Not on file  . Food insecurity    Worry: Not on file    Inability: Not on file  . Transportation needs    Medical: Not on file     Non-medical: Not on file  Tobacco Use  . Smoking status: Never Smoker  . Smokeless tobacco: Never Used  Substance and Sexual Activity  . Alcohol use: No    Alcohol/week: 0.0 standard drinks    Comment: occasional  . Drug use: No  . Sexual activity: Never    Birth control/protection: None  Lifestyle  . Physical activity    Days per week: Not on file    Minutes per session: Not on file  . Stress: Not on file  Relationships  . Social Herbalist on phone: Not on file    Gets together: Not on file    Attends religious service: Not on file    Active member of club or organization: Not on file    Attends meetings of clubs or organizations: Not on file    Relationship status: Not on file  Other Topics Concern  . Not on file  Social History Narrative  . Not on file    Past Medical History:  Diagnosis Date  . Anemia   . Anxiety and depression   . Blood transfusion without reported diagnosis   . Hx of preeclampsia, prior pregnancy, currently pregnant   . Macrosomia   . Panic disorder   .  Shoulder dystocia, delivered      Patient Active Problem List   Diagnosis Date Noted  . Iron deficiency anemia 12/26/2016  . Postpartum care following cesarean delivery 12/26/2016  . Maternal varicella, non-immune 10/05/2016  . Anemia of pregnancy in second trimester 10/05/2016  . UTI in pregnancy, antepartum 05/24/2016    Past Surgical History:  Procedure Laterality Date  . CESAREAN SECTION N/A 12/23/2016   Procedure: CESAREAN SECTION;  Surgeon: Vena AustriaAndreas Staebler, MD;  Location: [facility] ORS;  Service: Obstetrics;  Laterality: N/A;  . No past surgery      Family History        Family Status  Relation Name Status  . MGF  (Not Specified)  . Father  Alive  . MGM  (Not Specified)  . Mother  Alive        Her family history includes Diabetes in her maternal grandfather; Hypertension in her father and maternal grandmother; Thyroid disease in her mother.      Allergies   Allergen Reactions  . Albuterol Anaphylaxis  . Benadryl [Diphenhydramine] Anaphylaxis  . Buspar [Buspirone] Anaphylaxis  . Strawberry Extract Anaphylaxis     Current Outpatient Medications:  .  ibuprofen (ADVIL) 600 MG tablet, Take 1 tablet (600 mg total) by mouth every 8 (eight) hours as needed., Disp: 15 tablet, Rfl: 0 .  meloxicam (MOBIC) 7.5 MG tablet, Take 1 tablet (7.5 mg total) by mouth daily., Disp: 30 tablet, Rfl: 0   Patient Care Team: Maryella ShiversPollak, Adriana M, PA-C as PCP - General (Physician Assistant)    Objective:    Vitals: BP 134/85 (BP Location: Right Arm, Patient Position: Sitting, Cuff Size: Large)   Pulse 87   Temp 97.9 F (36.6 C) (Oral)   Resp 16   Ht 5\' 4"  (1.626 m)   Wt 221 lb 3.2 oz (100.3 kg)   SpO2 97%   BMI 37.97 kg/m    Vitals:   05/11/19 1110  BP: 134/85  Pulse: 87  Resp: 16  Temp: 97.9 F (36.6 C)  TempSrc: Oral  SpO2: 97%  Weight: 221 lb 3.2 oz (100.3 kg)  Height: 5\' 4"  (1.626 m)     Physical Exam Constitutional:      Appearance: Normal appearance.  Musculoskeletal:     Comments: She is able to abduct right shoulder past 90 degrees with modest pain. There is tenderness along the posterolateral aspect of the right acromion. She is able to pronate and supinate as well as flex and extend her right elbow with some pain and stiffness.   Skin:    General: Skin is warm and dry.  Neurological:     General: No focal deficit present.     Mental Status: She is alert and oriented to person, place, and time.  Psychiatric:        Mood and Affect: Mood normal.        Behavior: Behavior normal.      Depression Screen PHQ 2/9 Scores 05/11/2019  PHQ - 2 Score 0  PHQ- 9 Score 2       Assessment & Plan:     Routine Health Maintenance and Physical Exam  Exercise Activities and Dietary recommendations Goals   None     Immunization History  Administered Date(s) Administered  . Influenza,inj,Quad PF,6+ Mos 09/30/2016    Health  Maintenance  Topic Date Due  . PAP-Cervical Cytology Screening  08/15/2015  . PAP SMEAR-Modifier  08/15/2015  . INFLUENZA VACCINE  05/05/2019  . TETANUS/TDAP  05/10/2029  .  HIV Screening  Completed     Discussed health benefits of physical activity, and encouraged her to engage in regular exercise appropriate for her age and condition.    1. Acute pain of right shoulder  No evidence of avulsion injury on xray. Rotator cuff strain vs. Tear due to injury. We will give her stronger NSAIDs and have her see ortho. Based on her ROM in clinic, I doubt she would have a full thickness tear. Possible partial thickness tear. I think she should wear the brace while working to avoid motion but I think she can take it off when she is resting to keep her elbow from stiffening.   [facility]   - Ambulatory referral to Orthopedics - meloxicam (MOBIC) 7.5 MG tablet; Take 1 tablet (7.5 mg total) by mouth daily.  Dispense: 30 tablet; Refill: 0  The entirety of the information documented in the History of Present Illness, Review of Systems and Physical Exam were personally obtained by me. Portions of this information were initially documented by Martyn EhrichLatasha Walston, CMA and reviewed by me for thoroughness and accuracy.      --------------------------------------------------------------------    Trey SailorsAdriana M Pollak, PA-C  [facility]

## 2019-05-11 NOTE — Patient Instructions (Signed)
Rotator Cuff Tear  A rotator cuff tear is a partial or complete tear of the cord-like bands (tendons) that connect muscle to bone in the rotator cuff. The rotator cuff is a group of muscles and tendons that surround the shoulder joint and keep the upper arm bone (humerus) in the shoulder socket. The tear can occur suddenly (acute tear) or can develop over a long period of time (chronic tear). What are the causes? Acute tears may be caused by:  A fall, especially on an outstretched arm.  Lifting very heavy objects with a jerking motion. Chronic tears may be caused by overuse of the muscles. This may happen in sports, physical work, or activities in which your arm repeatedly moves over your head. What increases the risk? This condition is more likely to occur in:  Athletes and workers who frequently use their shoulder or reach over their heads. This may include activities such as: ? Tennis. ? Baseball and softball. ? Swimming and rowing. ? Weightlifting. ? Construction work. ? Painting.  People who smoke.  Older people who have arthritis or poor blood supply. These can make the muscles and tendons weaker. What are the signs or symptoms? Symptoms of this condition depend on the type and severity of the injury:  An acute tear may include a sudden tearing feeling, followed by severe pain that goes from your upper shoulder, down your arm, and toward your elbow.  A chronic tear includes a gradual weakness and decreased shoulder motion as the pain gets worse. The pain is usually worse at night. Both types may have symptoms such as:  Pain that spreads (radiates) from the shoulder to the upper arm.  Swelling and tenderness in front of the shoulder.  Decreased range of motion.  Pain when: ? Reaching, pulling, or lifting the arm above the head. ? Lowering the arm from above the head.  Not being able to raise your arm out to the side.  Difficulty placing the arm behind your back. How  is this diagnosed? This condition is diagnosed with a medical history and physical exam. Imaging tests may also be done, including:  X-rays.  MRI.  Ultrasound.  CT or MR arthrogram. During this test, a contrast material is injected into your shoulder and then images are taken. How is this treated? Treatment for this condition depends on the type and severity of the condition. In less severe cases, treatment may include:  Rest. This may be done with a sling that holds the shoulder still (immobilization). Your health care provider may also recommend avoiding activities that involve lifting your arm over your head.  Icing the shoulder.  Anti-inflammatory medicines, such as aspirin or ibuprofen.  Strengthening and stretching exercises. Your health care provider may recommend specific exercises to improve your range of motion and strengthen your shoulder. In more severe cases, treatment may include:  Physical therapy.  Steroid injections.  Surgery. Follow these instructions at home: Managing pain, stiffness, and swelling  If directed, put ice on the injured area. ? If you have a removable sling, remove it as told by your health care provider. ? Put ice in a plastic bag. ? Place a towel between your skin and the bag. ? Leave the ice on for 20 minutes, 2-3 times a day.  Raise (elevate) the injured area above the level of your heart while you are lying down.  Find a comfortable sleeping position or sleep on a recliner, if available.  Move your fingers often to avoid stiffness   and to lessen swelling.  Once the swelling has gone down, your health care provider may direct you to apply heat to relax the muscles. Use the heat source that your health care provider recommends, such as a moist heat pack or a heating pad. ? Place a towel between your skin and the heat source. ? Leave the heat on for 20-30 minutes. ? Remove the heat if your skin turns bright red. This is especially  important if you are unable to feel pain, heat, or cold. You may have a greater risk of getting burned. If you have a sling:  Wear the sling as told by your health care provider. Remove it only as told by your health care provider.  Loosen the sling if your fingers tingle, become numb, or turn cold and blue.  Keep the sling clean.  If the sling is not waterproof: ? Do not let it get wet. ? Cover it with a watertight covering when you take a bath or a shower. Driving  Do not drive or use heavy machinery while taking prescription pain medicine.  Ask your health care provider when it is safe to drive if you have a sling on your arm. Activity  Rest your shoulder as told by your health care provider.  Return to your normal activities as told by your health care provider. Ask your health care provider what activities are safe for you.  Do any exercises or stretches as told by your health care provider. General instructions  Do not use any products that contain nicotine or tobacco, such as cigarettes and e-cigarettes. If you need help quitting, ask your health care provider.  Take over-the-counter and prescription medicines only as told by your health care provider.  Keep all follow-up visits as told by your health care provider. This is important. Contact a health care provider if:  Your pain gets worse.  You have new pain in your arm, hands, or fingers.  Medicine does not help your pain. Get help right away if:  Your arm, hand, or fingers are numb or tingling.  Your arm, hand, or fingers are swollen or painful or they turn white or blue.  Your hand or fingers on your injured arm are colder than your other hand. Summary  A rotator cuff tear is a partial or complete tear of the cord-like bands (tendons) that connect muscle to bone in the rotator cuff.  The tear can occur suddenly (acute tear) or can develop over a long period of time (chronic tear).  Treatment generally  includes rest, anti-inflammatory medicines, and icing. In some cases, physical therapy and steroid injections may be needed. In severe cases, surgery may be needed. This information is not intended to replace advice given to you by your health care provider. Make sure you discuss any questions you have with your health care provider. Document Released: 09/17/2000 Document Revised: 09/02/2017 Document Reviewed: 12/06/2016 Elsevier Patient Education  2020 Elsevier Inc[employer]  

## 2019-05-21 ENCOUNTER — Encounter: Payer: Self-pay | Admitting: Physician Assistant

## 2019-06-04 ENCOUNTER — Encounter: Payer: Self-pay | Admitting: Physician Assistant

## 2019-06-04 ENCOUNTER — Ambulatory Visit (INDEPENDENT_AMBULATORY_CARE_PROVIDER_SITE_OTHER): Payer: Commercial Managed Care - PPO | Admitting: Physician Assistant

## 2019-06-04 ENCOUNTER — Other Ambulatory Visit: Payer: Self-pay

## 2019-06-04 VITALS — BP 119/81 | HR 91 | Temp 96.9°F | Resp 16 | Ht 63.0 in | Wt 225.0 lb

## 2019-06-04 DIAGNOSIS — N926 Irregular menstruation, unspecified: Secondary | ICD-10-CM

## 2019-06-04 DIAGNOSIS — Z0000 Encounter for general adult medical examination without abnormal findings: Secondary | ICD-10-CM | POA: Diagnosis not present

## 2019-06-04 DIAGNOSIS — S91311A Laceration without foreign body, right foot, initial encounter: Secondary | ICD-10-CM

## 2019-06-04 NOTE — Progress Notes (Signed)
Patient: Erika Solomon, Female    DOB: 03-24-1996, 23 y.o.   MRN: [id number] Visit Date: 06/04/2019  Today's Provider: Trinna Post, PA-C   Chief Complaint  Patient presents with  . Annual Exam   Subjective:    Annual physical exam Erika Solomon is a 23 y.o. female who presents today for health maintenance and complete physical. She feels well. She reports exercising no. She reports she is sleeping fairly well.  Declines PAP smear, last performed 2017 by Dr. Marcelline Mates.  Last Pap:09/30/2016  Has seen Dr. Mack Guise for right shoulder pain at [facility]. She continues to wear the sling during working hours. She is continuing with Meloxicam and she will be undergoing physical therapy. If that does not work, there is a plan to complete a steroid injection.   She broke a dish yesterday and it cut the top of her right foot. She had a tetanus shot within the past 5 years.   She has a nexplanon, placed in 2018. She reports irregular bleeding with this.  -----------------------------------------------------------------    Review of Systems  Constitutional: Negative.   HENT: Positive for nosebleeds.   Eyes: Negative.   Respiratory: Negative.   Cardiovascular: Negative.   Gastrointestinal: Negative.   Endocrine: Negative.   Genitourinary: Negative.   Musculoskeletal: Negative.   Skin: Negative.   Allergic/Immunologic: Negative.   Neurological: Negative.   Hematological: Negative.   Psychiatric/Behavioral: Negative.     Social History She  reports that she has never smoked. She has never used smokeless tobacco. She reports that she does not drink alcohol or use drugs. Social History   Socioeconomic History  . Marital status: Single    Spouse name: Not on file  . Number of children: Not on file  . Years of education: Not on file  . Highest education level: Not on file  Occupational History  . Occupation: scanner    Comment: bothers back too much  Social  Needs  . Financial resource strain: Not on file  . Food insecurity    Worry: Not on file    Inability: Not on file  . Transportation needs    Medical: Not on file    Non-medical: Not on file  Tobacco Use  . Smoking status: Never Smoker  . Smokeless tobacco: Never Used  Substance and Sexual Activity  . Alcohol use: No    Alcohol/week: 0.0 standard drinks    Comment: occasional  . Drug use: No  . Sexual activity: Never    Birth control/protection: None  Lifestyle  . Physical activity    Days per week: Not on file    Minutes per session: Not on file  . Stress: Not on file  Relationships  . Social Herbalist on phone: Not on file    Gets together: Not on file    Attends religious service: Not on file    Active member of club or organization: Not on file    Attends meetings of clubs or organizations: Not on file    Relationship status: Not on file  Other Topics Concern  . Not on file  Social History Narrative  . Not on file    Patient Active Problem List   Diagnosis Date Noted  . Iron deficiency anemia 12/26/2016  . Postpartum care following cesarean delivery 12/26/2016  . Maternal varicella, non-immune 10/05/2016  . Anemia of pregnancy in second trimester 10/05/2016  . UTI in pregnancy, antepartum 05/24/2016  Past Surgical History:  Procedure Laterality Date  . CESAREAN SECTION N/A 12/23/2016   Procedure: CESAREAN SECTION;  Surgeon: Vena Austria, MD;  Location: [facility] ORS;  Service: Obstetrics;  Laterality: N/A;  . No past surgery      Family History  Family Status  Relation Name Status  . MGF  (Not Specified)  . Father  Alive  . MGM  (Not Specified)  . Mother  Alive   Her family history includes Diabetes in her maternal grandfather; Hypertension in her father and maternal grandmother; Thyroid disease in her mother.     Allergies  Allergen Reactions  . Albuterol Anaphylaxis  . Benadryl [Diphenhydramine] Anaphylaxis  . Buspar [Buspirone]  Anaphylaxis  . Strawberry Extract Anaphylaxis    Previous Medications   IBUPROFEN (ADVIL) 600 MG TABLET    Take 1 tablet (600 mg total) by mouth every 8 (eight) hours as needed.   MELOXICAM (MOBIC) 7.5 MG TABLET    Take 1 tablet (7.5 mg total) by mouth daily.    Patient Care Team: Maryella Shivers as PCP - General (Physician Assistant)      Objective:   Vitals: BP 119/81 (BP Location: Left Arm, Patient Position: Sitting, Cuff Size: Normal)   Pulse 91   Temp (!) 96.9 F (36.1 C) (Temporal)   Resp 16   Ht 5\' 3"  (1.6 m)   Wt 225 lb (102.1 kg)   BMI 39.86 kg/m    Physical Exam Constitutional:      Appearance: Normal appearance. She is obese.  Cardiovascular:     Rate and Rhythm: Normal rate and regular rhythm.     Heart sounds: Normal heart sounds.  Pulmonary:     Effort: Pulmonary effort is normal.     Breath sounds: Normal breath sounds.  Abdominal:     General: Abdomen is flat. Bowel sounds are normal.     Palpations: Abdomen is soft.  Musculoskeletal:       Feet:  Skin:    General: Skin is warm and dry.  Neurological:     Mental Status: She is alert and oriented to person, place, and time. Mental status is at baseline.  Psychiatric:        Mood and Affect: Mood normal.        Behavior: Behavior normal.      Depression Screen PHQ 2/9 Scores 05/11/2019  PHQ - 2 Score 0  PHQ- 9 Score 2      Assessment & Plan:     Routine Health Maintenance and Physical Exam  Exercise Activities and Dietary recommendations Goals   None     Immunization History  Administered Date(s) Administered  . Influenza,inj,Quad PF,6+ Mos 09/30/2016    Health Maintenance  Topic Date Due  . PAP-Cervical Cytology Screening  08/15/2015  . PAP SMEAR-Modifier  08/15/2015  . INFLUENZA VACCINE  05/05/2019  . TETANUS/TDAP  05/10/2029  . HIV Screening  Completed     Discussed health benefits of physical activity, and encouraged her to engage in regular exercise  appropriate for her age and condition.    1. Annual physical exam  - CBC with Differential/Platelet - Comprehensive metabolic panel - Lipid panel - TSH  2. Laceration of dorsum of foot, right, initial encounter  Well healing, tetanus up to date. Explained self limiting nature of this.   3. Irregular bleeding  2/2 nexplanon. Explained if it bothers her she should have it removed and explore other birth control. She can get IUD. If she chooses to  get it here, can do Nexplanon removal, IUD and PAP. If not, she should schedule her PAP.  The entirety of the information documented in the History of Present Illness, Review of Systems and Physical Exam were personally obtained by me. Portions of this information were initially documented by Rondel BatonSulibeya Dimas, CMA and reviewed by me for thoroughness and accuracy.   F/u 1 year  --------------------------------------------------------------------

## 2019-06-04 NOTE — Patient Instructions (Signed)
Your homework is to schedule a PAP smear with [facility].    Health Maintenance, Female Adopting a healthy lifestyle and getting preventive care are important in promoting health and wellness. Ask your health care provider about:  The right schedule for you to have regular tests and exams.  Things you can do on your own to prevent diseases and keep yourself healthy. What should I know about diet, weight, and exercise? Eat a healthy diet   Eat a diet that includes plenty of vegetables, fruits, low-fat dairy products, and lean protein.  Do not eat a lot of foods that are high in solid fats, added sugars, or sodium. Maintain a healthy weight Body mass index (BMI) is used to identify weight problems. It estimates body fat based on height and weight. Your health care provider can help determine your BMI and help you achieve or maintain a healthy weight. Get regular exercise Get regular exercise. This is one of the most important things you can do for your health. Most adults should:  Exercise for at least 150 minutes each week. The exercise should increase your heart rate and make you sweat (moderate-intensity exercise).  Do strengthening exercises at least twice a week. This is in addition to the moderate-intensity exercise.  Spend less time sitting. Even light physical activity can be beneficial. Watch cholesterol and blood lipids Have your blood tested for lipids and cholesterol at 25 years of age, then have this test every 5 years. Have your cholesterol levels checked more often if:  Your lipid or cholesterol levels are high.  You are older than 25 years of age.  You are at high risk for heart disease. What should I know about cancer screening? Depending on your health history and family history, you may need to have cancer screening at various ages. This may include screening for:  Breast cancer.  Cervical cancer.  Colorectal cancer.  Skin cancer.  Lung cancer. What  should I know about heart disease, diabetes, and high blood pressure? Blood pressure and heart disease  High blood pressure causes heart disease and increases the risk of stroke. This is more likely to develop in people who have high blood pressure readings, are of African descent, or are overweight.  Have your blood pressure checked: ? Every 3-5 years if you are 2818-25 years of age. ? Every year if you are 25 years old or older. Diabetes Have regular diabetes screenings. This checks your fasting blood sugar level. Have the screening done:  Once every three years after age 25 if you are at a normal weight and have a low risk for diabetes.  More often and at a younger age if you are overweight or have a high risk for diabetes. What should I know about preventing infection? Hepatitis B If you have a higher risk for hepatitis B, you should be screened for this virus. Talk with your health care provider to find out if you are at risk for hepatitis B infection. Hepatitis C Testing is recommended for:  Everyone born from [birth year] through 1965.  Anyone with known risk factors for hepatitis C. Sexually transmitted infections (STIs)  Get screened for STIs, including gonorrhea and chlamydia, if: ? You are sexually active and are younger than 25 years of age. ? You are older than 25 years of age and your health care provider tells you that you are at risk for this type of infection. ? Your sexual activity has changed since you were last screened, and you  are at increased risk for chlamydia or gonorrhea. Ask your health care provider if you are at risk.  Ask your health care provider about whether you are at high risk for HIV. Your health care provider may recommend a prescription medicine to help prevent HIV infection. If you choose to take medicine to prevent HIV, you should first get tested for HIV. You should then be tested every 3 months for as long as you are taking the medicine. Pregnancy  If  you are about to stop having your period (premenopausal) and you may become pregnant, seek counseling before you get pregnant.  Take 400 to 800 micrograms (mcg) of folic acid every day if you become pregnant.  Ask for birth control (contraception) if you want to prevent pregnancy. Osteoporosis and menopause Osteoporosis is a disease in which the bones lose minerals and strength with aging. This can result in bone fractures. If you are 68 years old or older, or if you are at risk for osteoporosis and fractures, ask your health care provider if you should:  Be screened for bone loss.  Take a calcium or vitamin D supplement to lower your risk of fractures.  Be given hormone replacement therapy (HRT) to treat symptoms of menopause. Follow these instructions at home: Lifestyle  Do not use any products that contain nicotine or tobacco, such as cigarettes, e-cigarettes, and chewing tobacco. If you need help quitting, ask your health care provider.  Do not use street drugs.  Do not share needles.  Ask your health care provider for help if you need support or information about quitting drugs. Alcohol use  Do not drink alcohol if: ? Your health care provider tells you not to drink. ? You are pregnant, may be pregnant, or are planning to become pregnant.  If you drink alcohol: ? Limit how much you use to 0-1 drink a day. ? Limit intake if you are breastfeeding.  Be aware of how much alcohol is in your drink. In the [place] one drink equals one 12 oz bottle of beer (355 mL), one 5 oz glass of wine (148 mL), or one 1 oz glass of hard liquor (44 mL). General instructions  Schedule regular health, dental, and eye exams.  Stay current with your vaccines.  Tell your health care provider if: ? You often feel depressed. ? You have ever been abused or do not feel safe at home. Summary  Adopting a healthy lifestyle and getting preventive care are important in promoting health and  wellness.  Follow your health care provider's instructions about healthy diet, exercising, and getting tested or screened for diseases.  Follow your health care provider's instructions on monitoring your cholesterol and blood pressure. This information is not intended to replace advice given to you by your health care provider. Make sure you discuss any questions you have with your health care provider. Document Released: 04/05/2011 Document Revised: 09/13/2018 Document Reviewed: 09/13/2018 Elsevier Patient Education  2020 [employer].

## 2019-06-05 LAB — COMPREHENSIVE METABOLIC PANEL
ALT: 24 IU/L (ref 0–32)
AST: 21 IU/L (ref 0–40)
Albumin/Globulin Ratio: 1.7 (ref 1.2–2.2)
Albumin: 4.7 g/dL (ref 3.9–5.0)
Alkaline Phosphatase: 51 IU/L (ref 39–117)
BUN/Creatinine Ratio: 19 (ref 9–23)
BUN: 14 mg/dL (ref 6–20)
Bilirubin Total: 0.3 mg/dL (ref 0.0–1.2)
CO2: 22 mmol/L (ref 20–29)
Calcium: 9.8 mg/dL (ref 8.7–10.2)
Chloride: 104 mmol/L (ref 96–106)
Creatinine, Ser: 0.75 mg/dL (ref 0.57–1.00)
GFR calc Af Amer: 129 mL/min/{1.73_m2} (ref >59)
GFR calc non Af Amer: 112 mL/min/{1.73_m2} (ref >59)
Globulin, Total: 2.8 g/dL (ref 1.5–4.5)
Glucose: 91 mg/dL (ref 65–99)
Potassium: 4 mmol/L (ref 3.5–5.2)
Sodium: 142 mmol/L (ref 134–144)
Total Protein: 7.5 g/dL (ref 6.0–8.5)

## 2019-06-05 LAB — LIPID PANEL
Chol/HDL Ratio: 5.7 ratio — ABNORMAL HIGH (ref 0.0–4.4)
Cholesterol, Total: 149 mg/dL (ref 100–199)
HDL: 26 mg/dL — ABNORMAL LOW (ref >39)
LDL Chol Calc (NIH): 56 mg/dL (ref 0–99)
Triglycerides: 443 mg/dL — ABNORMAL HIGH (ref 0–149)
VLDL Cholesterol Cal: 67 mg/dL — ABNORMAL HIGH (ref 5–40)

## 2019-06-05 LAB — CBC WITH DIFFERENTIAL/PLATELET
Basophils Absolute: 0 10*3/uL (ref 0.0–0.2)
Basos: 1 %
EOS (ABSOLUTE): 0.1 10*3/uL (ref 0.0–0.4)
Eos: 1 %
Hematocrit: 38.3 % (ref 34.0–46.6)
Hemoglobin: 12.4 g/dL (ref 11.1–15.9)
Immature Grans (Abs): 0.1 10*3/uL (ref 0.0–0.1)
Immature Granulocytes: 1 %
Lymphocytes Absolute: 2.2 10*3/uL (ref 0.7–3.1)
Lymphs: 26 %
MCH: 28.6 pg (ref 26.6–33.0)
MCHC: 32.4 g/dL (ref 31.5–35.7)
MCV: 88 fL (ref 79–97)
Monocytes Absolute: 0.7 10*3/uL (ref 0.1–0.9)
Monocytes: 8 %
Neutrophils Absolute: 5.3 10*3/uL (ref 1.4–7.0)
Neutrophils: 63 %
Platelets: 231 10*3/uL (ref 150–450)
RBC: 4.34 x10E6/uL (ref 3.77–5.28)
RDW: 13.9 % (ref 11.7–15.4)
WBC: 8.4 10*3/uL (ref 3.4–10.8)

## 2019-06-05 LAB — TSH: TSH: 2.93 u[IU]/mL (ref 0.450–4.500)

## 2019-06-08 ENCOUNTER — Other Ambulatory Visit: Payer: Self-pay | Admitting: *Deleted

## 2019-06-08 DIAGNOSIS — Z20822 Contact with and (suspected) exposure to covid-19: Secondary | ICD-10-CM

## 2019-06-10 LAB — NOVEL CORONAVIRUS, NAA: SARS-CoV-2, NAA: NOT DETECTED

## 2019-06-18 ENCOUNTER — Other Ambulatory Visit: Payer: Self-pay

## 2019-06-18 DIAGNOSIS — Z20822 Contact with and (suspected) exposure to covid-19: Secondary | ICD-10-CM

## 2019-06-19 LAB — NOVEL CORONAVIRUS, NAA: SARS-CoV-2, NAA: NOT DETECTED

## 2019-06-21 ENCOUNTER — Other Ambulatory Visit: Payer: Self-pay | Admitting: Physician Assistant

## 2019-06-21 DIAGNOSIS — M25511 Pain in right shoulder: Secondary | ICD-10-CM

## 2019-06-22 ENCOUNTER — Other Ambulatory Visit: Payer: Self-pay

## 2019-06-22 ENCOUNTER — Telehealth (INDEPENDENT_AMBULATORY_CARE_PROVIDER_SITE_OTHER): Payer: Commercial Managed Care - PPO | Admitting: Physician Assistant

## 2019-06-22 DIAGNOSIS — E6609 Other obesity due to excess calories: Secondary | ICD-10-CM

## 2019-06-22 DIAGNOSIS — K580 Irritable bowel syndrome with diarrhea: Secondary | ICD-10-CM | POA: Diagnosis not present

## 2019-06-22 DIAGNOSIS — J029 Acute pharyngitis, unspecified: Secondary | ICD-10-CM

## 2019-06-22 DIAGNOSIS — Z6839 Body mass index (BMI) 39.0-39.9, adult: Secondary | ICD-10-CM | POA: Diagnosis not present

## 2019-06-22 NOTE — Progress Notes (Signed)
Patient: Erika Solomon Female    DOB: April 27, 1996   23 y.o.   MRN: [id number] Visit Date: 06/22/2019  Today's Provider: Trinna Post, PA-C   Chief Complaint  Patient presents with  . Cough  . Obesity   Subjective:    Virtual Visit via Telephone Note  I connected with Erika Solomon on 06/22/19 at 11:20 AM EDT by telephone and verified that I am speaking with the correct person using two identifiers.   I discussed the limitations, risks, security and privacy concerns of performing an evaluation and management service by telephone and the availability of in person appointments. I also discussed with the patient that there may be a patient responsible charge related to this service. The patient expressed understanding and agreed to proceed.  Patient location: home Provider location: [place] office  Persons involved in the visit: patient, provider    HPI   Reports she has had two coronavirus tests and she is not any better. Tested for COVID on 06/08/2019 and 06/18/2019.   Reports she had diarrhea and nausea on 06/08/2019. She reports diarrhea persists. Diarrhea 6 times a day, started out formed and went to watery diarrhea. No diarrhea is no longer water. No blood in stool, unsure if she had mucous in her stool. Reports that fiance had similar GI illness. Diarrhea 4-5 times daily, slightly more formed. No antibiotics in the past three months. Vomited occasionally and got zofran which helped slightly.   Sore throat on 9/7 and 06/12/2019. Reports she had swollen lymph nodes on that day and tonsils were slightly red but did not see white spots. Swollen lymph nodes have improved. Reports temperature was 100.50F for a day. She took some tylenol and it resolved on 06/14/2019. Have felt chilly since then. A little bit of cough but more from throat irritation. No wheezing. Runny nose. No rash. Her work has told her to be tested for COVID twice and she has done so, both  times negative. They are not allowing her to return to work due to persistent symptoms.   2 weeks from fever  Obesity: unsure highest adult weight. Lowest adult weight at age 23-2016 (150 lbs). She has had two children in three years. She is currently 5'3 and weighs 225 lbs.   Drinks: Water. 2L of pepsi daily. Sweet tea with meals and fast good. Gatorade occasionally, juice occasionally.  Breakfast: skips this Lunch: ham and cheese sandwich with chips, mcdonalds - chicken sandwich, fries, and a sweet tea; big mac with a sweet tea Dinner: easy noodles, chicken and noodles  Snacks: potato chips - 2 individual bags daily, beef jerky    BMI Readings from Last 5 Encounters:  06/04/19 39.86 kg/m  05/11/19 37.97 kg/m  05/02/19 37.20 kg/m  06/23/18 34.75 kg/m  06/21/18 33.13 kg/m     Allergies  Allergen Reactions  . Albuterol Anaphylaxis  . Benadryl [Diphenhydramine] Anaphylaxis  . Buspar [Buspirone] Anaphylaxis  . Strawberry Extract Anaphylaxis     Current Outpatient Medications:  .  ibuprofen (ADVIL) 600 MG tablet, Take 1 tablet (600 mg total) by mouth every 8 (eight) hours as needed., Disp: 15 tablet, Rfl: 0 .  meloxicam (MOBIC) 7.5 MG tablet, TAKE 1 TABLET BY MOUTH EVERY DAY (Patient not taking: Reported on 06/22/2019), Disp: 30 tablet, Rfl: 0  Review of Systems  Constitutional: Negative.   Respiratory: Positive for cough.     Social History   Tobacco Use  . Smoking status:  Never Smoker  . Smokeless tobacco: Never Used  Substance Use Topics  . Alcohol use: No    Alcohol/week: 0.0 standard drinks    Comment: occasional      Objective:   There were no vitals taken for this visit. There were no vitals filed for this visit.There is no height or weight on file to calculate BMI.   Physical Exam   No results found for any visits on 06/22/19.     Assessment & Plan    1. Irritable bowel syndrome with diarrhea  Patient has close contact with similar symptoms.  She does not have red flag signs, suspect post infectious IBS  May use immodium. Follow up if not improving.   2. Sore throat  Her young children have returned to in person school recently and have similar symptoms. Counseled on natural course and duration of such symptoms. Will provide work note.   3. Class 2 obesity due to excess calories without serious comorbidity with body mass index (BMI) of 39.0 to 39.9 in adult  Counseled on eliminating sweetened drinks and processed foods. Suggested she visit [email or web address] and review the diabetes plate method which I think offers good, general advice about nutrition.   F/u PRN     Trinna Post, PA-C  [facility]

## 2019-06-29 ENCOUNTER — Telehealth (INDEPENDENT_AMBULATORY_CARE_PROVIDER_SITE_OTHER): Payer: Commercial Managed Care - PPO | Admitting: Physician Assistant

## 2019-06-29 ENCOUNTER — Telehealth: Payer: Self-pay

## 2019-06-29 DIAGNOSIS — R197 Diarrhea, unspecified: Secondary | ICD-10-CM | POA: Diagnosis not present

## 2019-06-29 DIAGNOSIS — R110 Nausea: Secondary | ICD-10-CM

## 2019-06-29 MED ORDER — DICYCLOMINE HCL 10 MG PO CAPS
10.0000 mg | ORAL_CAPSULE | Freq: Three times a day (TID) | ORAL | 0 refills | Status: DC
Start: 2019-06-29 — End: 2020-07-16

## 2019-06-29 MED ORDER — ONDANSETRON HCL 4 MG PO TABS: 4.0000 mg | ORAL_TABLET | Freq: Three times a day (TID) | ORAL | 0 refills | Status: DC | PRN

## 2019-06-29 MED ORDER — VIBERZI 100 MG PO TABS: 100.0000 mg | ORAL_TABLET | Freq: Two times a day (BID) | ORAL | 0 refills | Status: DC

## 2019-06-29 NOTE — Progress Notes (Signed)
Patient: Erika Solomon Female    DOB: 06-27-1996   23 y.o.   MRN: [id number] Visit Date: 06/29/2019  Today's Provider: Trinna Post, PA-C   Chief Complaint  Patient presents with  . Follow-up   Subjective:    Virtual Visit via Telephone Note  I connected with Erika Solomon on 06/29/19 at 11:20 AM EDT by a video enabled telemedicine application and verified that I am speaking with the correct person using two identifiers.  Location: Patient: Home Provider: Office   I discussed the limitations of evaluation and management by telemedicine and the availability of in person appointments. The patient expressed understanding and agreed to proceed.  HPI  Follow up for irritable bowel syndrome with diarrhea  The patient was last seen for this 1 weeks ago. Changes made at last visit include may use immodium.  She reports good compliance with treatment. She feels that condition is Worse. Patient reports still having diarrhea with use of immodium. Patient reports that dizziness, and nausea denies any vomiting. Reports bad pain and cramping. Having diarrhea daily. Has some black streaks the other day, was not taking Pepto Bismol. One grandfather with Crohn's disease.  She is not having side effects.  ------------------------------------------------------------------------------------   Allergies  Allergen Reactions  . Albuterol Anaphylaxis  . Benadryl [Diphenhydramine] Anaphylaxis  . Buspar [Buspirone] Anaphylaxis  . Strawberry Extract Anaphylaxis     Current Outpatient Medications:  .  ibuprofen (ADVIL) 600 MG tablet, Take 1 tablet (600 mg total) by mouth every 8 (eight) hours as needed., Disp: 15 tablet, Rfl: 0 .  meloxicam (MOBIC) 7.5 MG tablet, TAKE 1 TABLET BY MOUTH EVERY DAY (Patient not taking: Reported on 06/22/2019), Disp: 30 tablet, Rfl: 0  Review of Systems  Constitutional: Negative.   Cardiovascular: Negative.   Gastrointestinal: Positive for abdominal  pain, diarrhea and nausea.  Neurological: Positive for dizziness.    Social History   Tobacco Use  . Smoking status: Never Smoker  . Smokeless tobacco: Never Used  Substance Use Topics  . Alcohol use: No    Alcohol/week: 0.0 standard drinks    Comment: occasional      Objective:   There were no vitals taken for this visit. There were no vitals filed for this visit.There is no height or weight on file to calculate BMI.   Physical Exam   No results found for any visits on 06/29/19.     Assessment & Plan    1. Diarrhea, unspecified type  We will get initial screening labs for inflammatory markers and celiac disease. Stool sample as well. Trial of bentyl and viberzi. Work note to return on 07/03/2019. Referral to GI if not effective.   - Comprehensive Metabolic Panel (CMET) - CBC with Differential - Sed Rate (ESR) - C-reactive protein - IgA - Tissue transglutaminase, IgA - GI Profile, Stool, PCR - dicyclomine (BENTYL) 10 MG capsule; Take 1 capsule (10 mg total) by mouth 4 (four) times daily -  before meals and at bedtime.  Dispense: 360 capsule; Refill: 0 - Eluxadoline (VIBERZI) 100 MG TABS; Take 1 tablet (100 mg total) by mouth 2 (two) times daily.  Dispense: 180 tablet; Refill: 0  2. Nausea  - ondansetron (ZOFRAN) 4 MG tablet; Take 1 tablet (4 mg total) by mouth every 8 (eight) hours as needed for nausea or vomiting.  Dispense: 20 tablet; Refill: 0  The entirety of the information documented in the History of Present Illness, Review of Systems and  Physical Exam were personally obtained by me. Portions of this information were initially documented by Lynford Humphrey, CMA and reviewed by me for thoroughness and accuracy.    Trinna Post, PA-C  [facility]

## 2019-06-29 NOTE — Telephone Encounter (Signed)
Patient states that she is waiting on her note for work. Please call to let her know when this will be ready.

## 2019-07-02 NOTE — Telephone Encounter (Signed)
Pt. Advised of letter ready.

## 2019-07-11 ENCOUNTER — Other Ambulatory Visit: Payer: Self-pay | Admitting: Physician Assistant

## 2019-07-11 ENCOUNTER — Ambulatory Visit: Payer: Commercial Managed Care - PPO | Admitting: Physician Assistant

## 2019-07-11 DIAGNOSIS — R197 Diarrhea, unspecified: Secondary | ICD-10-CM

## 2019-07-11 DIAGNOSIS — Z111 Encounter for screening for respiratory tuberculosis: Secondary | ICD-10-CM

## 2019-07-13 LAB — QUANTIFERON-TB GOLD PLUS
QuantiFERON Mitogen Value: >10 IU/mL
QuantiFERON Nil Value: 0.01 IU/mL
QuantiFERON TB1 Ag Value: 0.03 IU/mL
QuantiFERON TB2 Ag Value: 0.01 IU/mL
QuantiFERON-TB Gold Plus: NEGATIVE

## 2019-07-13 LAB — CBC WITH DIFFERENTIAL/PLATELET
Basophils Absolute: 0 10*3/uL (ref 0.0–0.2)
Basos: 0 %
EOS (ABSOLUTE): 0.1 10*3/uL (ref 0.0–0.4)
Eos: 1 %
Hematocrit: 33.9 % — ABNORMAL LOW (ref 34.0–46.6)
Hemoglobin: 11.3 g/dL (ref 11.1–15.9)
Immature Grans (Abs): 0.1 10*3/uL (ref 0.0–0.1)
Immature Granulocytes: 1 %
Lymphocytes Absolute: 1.8 10*3/uL (ref 0.7–3.1)
Lymphs: 26 %
MCH: 29.3 pg (ref 26.6–33.0)
MCHC: 33.3 g/dL (ref 31.5–35.7)
MCV: 88 fL (ref 79–97)
Monocytes Absolute: 0.3 10*3/uL (ref 0.1–0.9)
Monocytes: 5 %
Neutrophils Absolute: 4.7 10*3/uL (ref 1.4–7.0)
Neutrophils: 67 %
Platelets: 224 10*3/uL (ref 150–450)
RBC: 3.86 x10E6/uL (ref 3.77–5.28)
RDW: 14.2 % (ref 11.7–15.4)
WBC: 7 10*3/uL (ref 3.4–10.8)

## 2019-07-13 LAB — COMPREHENSIVE METABOLIC PANEL
ALT: 28 IU/L (ref 0–32)
AST: 18 IU/L (ref 0–40)
Albumin/Globulin Ratio: 1.6 (ref 1.2–2.2)
Albumin: 4.2 g/dL (ref 3.9–5.0)
Alkaline Phosphatase: 52 IU/L (ref 39–117)
BUN/Creatinine Ratio: 13 (ref 9–23)
BUN: 9 mg/dL (ref 6–20)
Bilirubin Total: 0.3 mg/dL (ref 0.0–1.2)
CO2: 21 mmol/L (ref 20–29)
Calcium: 9 mg/dL (ref 8.7–10.2)
Chloride: 107 mmol/L — ABNORMAL HIGH (ref 96–106)
Creatinine, Ser: 0.67 mg/dL (ref 0.57–1.00)
GFR calc Af Amer: 142 mL/min/{1.73_m2} (ref >59)
GFR calc non Af Amer: 123 mL/min/{1.73_m2} (ref >59)
Globulin, Total: 2.6 g/dL (ref 1.5–4.5)
Glucose: 129 mg/dL — ABNORMAL HIGH (ref 65–99)
Potassium: 3.8 mmol/L (ref 3.5–5.2)
Sodium: 142 mmol/L (ref 134–144)
Total Protein: 6.8 g/dL (ref 6.0–8.5)

## 2019-07-13 LAB — TISSUE TRANSGLUTAMINASE, IGA: Transglutaminase IgA: <2 U/mL (ref 0–3)

## 2019-07-13 LAB — SEDIMENTATION RATE: Sed Rate: 8 mm/hr (ref 0–32)

## 2019-07-13 LAB — IGA: IgA/Immunoglobulin A, Serum: 105 mg/dL (ref 87–352)

## 2019-07-13 LAB — C-REACTIVE PROTEIN: CRP: 3 mg/L (ref 0–10)

## 2019-07-16 ENCOUNTER — Telehealth: Payer: Self-pay | Admitting: Physician Assistant

## 2019-07-16 DIAGNOSIS — R197 Diarrhea, unspecified: Secondary | ICD-10-CM

## 2019-07-16 NOTE — Telephone Encounter (Signed)
°  Pt needing recent TB test results.  FMLA paperwork said Erika Solomon had referred her to a Specialist.  Pt is needing that date.  Please advise.  Thanks, [employer]

## 2019-07-17 NOTE — Telephone Encounter (Signed)
Patient states on her FMLA form it says we referred her to GI. However there is no information pertaining to this in epic. Please advise?

## 2019-07-17 NOTE — Telephone Encounter (Signed)
My apologies, I saw her labs were normal. We had discussed if labs were normal about seeing GI afterwards and I thought I had confirmed with her but I must not have. I would recommend going to GI if she is continuing to have diarrhea.

## 2019-07-17 NOTE — Telephone Encounter (Signed)
Referral placed.

## 2019-07-17 NOTE — Telephone Encounter (Signed)
Patient states she is still having diarrhea and would like to go ahead with referral to GI.

## 2019-07-17 NOTE — Addendum Note (Signed)
Addended by: Trinna Post on: 07/17/2019 01:54 PM   Modules accepted: Orders

## 2019-08-17 ENCOUNTER — Telehealth (INDEPENDENT_AMBULATORY_CARE_PROVIDER_SITE_OTHER): Payer: Commercial Managed Care - PPO | Admitting: Physician Assistant

## 2019-08-17 DIAGNOSIS — J040 Acute laryngitis: Secondary | ICD-10-CM | POA: Diagnosis not present

## 2019-08-17 DIAGNOSIS — J029 Acute pharyngitis, unspecified: Secondary | ICD-10-CM | POA: Diagnosis not present

## 2019-08-17 MED ORDER — PREDNISONE 20 MG PO TABS: 20.0000 mg | ORAL_TABLET | Freq: Every day | ORAL | 0 refills | Status: AC

## 2019-08-17 NOTE — Progress Notes (Signed)
Patient: Erika Solomon Female    DOB: 01/01/1996   23 y.o.   MRN: [id number] Visit Date: 08/17/2019  Today's Provider: Trinna Post, PA-C   Chief Complaint  Patient presents with  . Sore Throat   Subjective:    Virtual Visit via Video Note  I connected with Erika Solomon on 08/17/19 at 11:20 AM EST by a video enabled telemedicine application and verified that I am speaking with the correct person using two identifiers.  Location: Patient: Home Provider: Office    I discussed the limitations of evaluation and management by telemedicine and the availability of in person appointments. The patient expressed understanding and agreed to proceed.  Interactive audio and video communications were attempted, although failed due to patient's inability to connect to video. Continued visit with audio only interaction with patient agreement.  Sore Throat  This is a new problem. The current episode started in the past 7 days. The problem has been gradually worsening. There has been no fever. The pain is at a severity of 5/10. The pain is moderate. Associated symptoms include coughing, headaches and a hoarse voice. Pertinent negatives include no congestion, ear pain or shortness of breath.  Patient states that employer want her to be tested for COVID-19 before returning to work.    Allergies  Allergen Reactions  . Albuterol Anaphylaxis  . Benadryl [Diphenhydramine] Anaphylaxis  . Buspar [Buspirone] Anaphylaxis  . Strawberry Extract Anaphylaxis     Current Outpatient Medications:  .  dicyclomine (BENTYL) 10 MG capsule, Take 1 capsule (10 mg total) by mouth 4 (four) times daily -  before meals and at bedtime., Disp: 360 capsule, Rfl: 0 .  Eluxadoline (VIBERZI) 100 MG TABS, Take 1 tablet (100 mg total) by mouth 2 (two) times daily., Disp: 180 tablet, Rfl: 0 .  ibuprofen (ADVIL) 600 MG tablet, Take 1 tablet (600 mg total) by mouth every 8 (eight) hours as needed., Disp: 15  tablet, Rfl: 0 .  ondansetron (ZOFRAN) 4 MG tablet, Take 1 tablet (4 mg total) by mouth every 8 (eight) hours as needed for nausea or vomiting., Disp: 20 tablet, Rfl: 0  Review of Systems  Constitutional: Negative.   HENT: Positive for hoarse voice, sore throat and voice change. Negative for congestion and ear pain.   Respiratory: Positive for cough. Negative for shortness of breath.   Neurological: Positive for headaches.    Social History   Tobacco Use  . Smoking status: Never Smoker  . Smokeless tobacco: Never Used  Substance Use Topics  . Alcohol use: No    Alcohol/week: 0.0 standard drinks    Comment: occasional      Objective:   There were no vitals taken for this visit. There were no vitals filed for this visit.There is no height or weight on file to calculate BMI.   Physical Exam   No results found for any visits on 08/17/19.     Assessment & Plan    1. Sore throat  - Novel Coronavirus, NAA ([facility]) - predniSONE (DELTASONE) 20 MG tablet; Take 1 tablet (20 mg total) by mouth daily with breakfast for 5 days.  Dispense: 5 tablet; Refill: 0  2. Laryngitis   - Novel Coronavirus, NAA ([facility]) - predniSONE (DELTASONE) 20 MG tablet; Take 1 tablet (20 mg total) by mouth daily with breakfast for 5 days.  Dispense: 5 tablet; Refill: 0  I discussed the assessment and treatment plan with the patient. The patient was provided  an opportunity to ask questions and all were answered. The patient agreed with the plan and demonstrated an understanding of the instructions.   The patient was advised to call back or seek an in-person evaluation if the symptoms worsen or if the condition fails to improve as anticipated.  I provided 15 minutes of non-face-to-face time during this encounter.     Trey Sailors, PA-C  [facility]

## 2019-08-20 ENCOUNTER — Other Ambulatory Visit: Payer: Self-pay

## 2019-08-20 DIAGNOSIS — Z20822 Contact with and (suspected) exposure to covid-19: Secondary | ICD-10-CM

## 2019-08-22 LAB — NOVEL CORONAVIRUS, NAA: SARS-CoV-2, NAA: NOT DETECTED

## 2019-08-28 ENCOUNTER — Other Ambulatory Visit: Payer: Self-pay

## 2019-08-28 DIAGNOSIS — Z20822 Contact with and (suspected) exposure to covid-19: Secondary | ICD-10-CM

## 2019-08-29 ENCOUNTER — Ambulatory Visit: Payer: Commercial Managed Care - PPO | Admitting: Gastroenterology

## 2019-08-29 LAB — NOVEL CORONAVIRUS, NAA: SARS-CoV-2, NAA: NOT DETECTED

## 2019-09-19 ENCOUNTER — Ambulatory Visit (INDEPENDENT_AMBULATORY_CARE_PROVIDER_SITE_OTHER): Payer: Commercial Managed Care - PPO | Admitting: Physician Assistant

## 2019-09-19 ENCOUNTER — Other Ambulatory Visit: Payer: Self-pay

## 2019-09-19 DIAGNOSIS — R05 Cough: Secondary | ICD-10-CM | POA: Diagnosis not present

## 2019-09-19 DIAGNOSIS — R0602 Shortness of breath: Secondary | ICD-10-CM | POA: Diagnosis not present

## 2019-09-19 DIAGNOSIS — R059 Cough, unspecified: Secondary | ICD-10-CM

## 2019-09-19 MED ORDER — LEVALBUTEROL TARTRATE 45 MCG/ACT IN AERO: 2.0000 | INHALATION_SPRAY | Freq: Four times a day (QID) | RESPIRATORY_TRACT | 1 refills | Status: DC | PRN

## 2019-09-19 NOTE — Progress Notes (Signed)
Patient: Erika Solomon Female    DOB: [birth year]   23 y.o.   MRN: [id number] Visit Date: 09/19/2019  Today's Provider: Trey Sailors, PA-C   Chief Complaint  Patient presents with  . Cough   Subjective:     HPI   Virtual Visit via Telephone Note  I connected with Alois Cliche on 09/19/19 at 10:40 AM EST by telephone and verified that I am speaking with the correct person using two identifiers.  Location: Patient: Home Provider: Office   I discussed the limitations of evaluation and management by telemedicine and the availability of in person appointments. The patient expressed understanding and agreed to proceed.  Patient reports headaches, dry cough, loss of taste and smell. 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 Reports some shortness of breath. She has been using an Xopenex inhaler as needed from previous Rx but has run out. Denies fevers, chills, nausea, vomiting, diarrhea.   Allergies  Allergen Reactions  . Albuterol Anaphylaxis  . Benadryl [Diphenhydramine] Anaphylaxis  . Buspar [Buspirone] Anaphylaxis  . Strawberry Extract Anaphylaxis     Current Outpatient Medications:  .  dicyclomine (BENTYL) 10 MG capsule, Take 1 capsule (10 mg total) by mouth 4 (four) times daily -  before meals and at bedtime., Disp: 360 capsule, Rfl: 0 .  Eluxadoline (VIBERZI) 100 MG TABS, Take 1 tablet (100 mg total) by mouth 2 (two) times daily., Disp: 180 tablet, Rfl: 0 .  ibuprofen (ADVIL) 600 MG tablet, Take 1 tablet (600 mg total) by mouth every 8 (eight) hours as needed., Disp: 15 tablet, Rfl: 0 .  levalbuterol (XOPENEX HFA) 45 MCG/ACT inhaler, Inhale 2 puffs into the lungs every 6 (six) hours as needed for wheezing., Disp: 1 Inhaler, Rfl: 1 .  ondansetron (ZOFRAN) 4 MG tablet, Take 1 tablet (4 mg total) by mouth every 8 (eight) hours as needed for nausea or vomiting., Disp: 20 tablet, Rfl: 0  Review of Systems  Social History   Tobacco  Use  . Smoking status: Never Smoker  . Smokeless tobacco: Never Used  Substance Use Topics  . Alcohol use: No    Alcohol/week: 0.0 standard drinks    Comment: occasional      Objective:   There were no vitals taken for this visit. There were no vitals filed for this visit.There is no height or weight on file to calculate BMI.   Physical Exam   No results found for any visits on 09/19/19.     Assessment & Plan    1. Cough  Suspicious of COVID. Explained how to schedule appointment for testing. Work note provided. Explained isolation and return precautions.   - levalbuterol (XOPENEX HFA) 45 MCG/ACT inhaler; Inhale 2 puffs into the lungs every 6 (six) hours as needed for wheezing.  Dispense: 1 Inhaler; Refill: 1  2. SOB (shortness of breath)  - levalbuterol (XOPENEX HFA) 45 MCG/ACT inhaler; Inhale 2 puffs into the lungs every 6 (six) hours as needed for wheezing.  Dispense: 1 Inhaler; Refill: 1  Follow Up Instructions:    I discussed the assessment and treatment plan with the patient. The patient was provided an opportunity to ask questions and all were answered. The patient agreed with the plan and demonstrated an understanding of the instructions.   The patient was advised to call back or seek an in-person evaluation if the symptoms worsen or if the condition fails to improve as anticipated.  I  provided 15 minutes of non-face-to-face time during this encounter.  The entirety of the information documented in the History of Present Illness, Review of Systems and Physical Exam were personally obtained by me. Portions of this information were initially documented by Ashley Royalty, CMA and reviewed by me for thoroughness and accuracy.       Trinna Post, PA-C  [facility]

## 2019-09-21 ENCOUNTER — Other Ambulatory Visit: Payer: Self-pay

## 2019-09-21 ENCOUNTER — Ambulatory Visit: Payer: Commercial Managed Care - PPO | Attending: Internal Medicine

## 2019-09-21 DIAGNOSIS — Z20822 Contact with and (suspected) exposure to covid-19: Secondary | ICD-10-CM

## 2019-09-22 LAB — NOVEL CORONAVIRUS, NAA: SARS-CoV-2, NAA: NOT DETECTED

## 2019-09-27 ENCOUNTER — Other Ambulatory Visit: Payer: Commercial Managed Care - PPO

## 2019-10-01 ENCOUNTER — Ambulatory Visit: Payer: Commercial Managed Care - PPO | Attending: Internal Medicine

## 2019-10-01 DIAGNOSIS — Z20822 Contact with and (suspected) exposure to covid-19: Secondary | ICD-10-CM

## 2019-10-01 NOTE — Progress Notes (Signed)
Patient: Erika Solomon Female    DOB: [birth year]   23 y.o.   MRN: [id number] Visit Date: 10/02/2019  Today's Provider: Trey Sailors, PA-C   Chief Complaint  Patient presents with  . URI   Subjective:    Virtual Visit via Telephone Note  I connected with Erika Solomon on 10/02/19 at  8:00 AM EST by telephone and verified that I am speaking with the correct person using two identifiers.  Location: Patient: Home Provider: Office   I discussed the limitations, risks, security and privacy concerns of performing an evaluation and management service by telephone and the availability of in person appointments. I also discussed with the patient that there may be a patient responsible charge related to this service. The patient expressed understanding and agreed to proceed. URI  This is a new problem. The current episode started in the past 7 days. The problem has been gradually worsening. The maximum temperature recorded prior to her arrival was 100.4 - 100.9 F (99.0). Associated symptoms include congestion, coughing, headaches, rhinorrhea, sneezing, a sore throat and wheezing. Pertinent negatives include no diarrhea, ear pain, nausea, sinus pain or vomiting. She has tried increased fluids, decongestant, antihistamine and acetaminophen for the symptoms. The treatment provided no relief.   Patient is having body aches as well, COVID test on 09/21/2019 and a COVID test on 10/01/2019. Hasn't picked up levalbuterol due to insurance. Reports her sore throat is making her difficult to drink. She is also having rib pain.    Allergies  Allergen Reactions  . Albuterol Anaphylaxis  . Benadryl [Diphenhydramine] Anaphylaxis  . Buspar [Buspirone] Anaphylaxis  . Strawberry Extract Anaphylaxis     Current Outpatient Medications:  .  Eluxadoline (VIBERZI) 100 MG TABS, Take 1 tablet (100 mg total) by mouth 2 (two) times daily., Disp: 180 tablet, Rfl: 0 .  ibuprofen (ADVIL) 600 MG  tablet, Take 1 tablet (600 mg total) by mouth every 8 (eight) hours as needed., Disp: 15 tablet, Rfl: 0 .  levalbuterol (XOPENEX HFA) 45 MCG/ACT inhaler, Inhale 2 puffs into the lungs every 6 (six) hours as needed for wheezing., Disp: 1 Inhaler, Rfl: 1 .  dicyclomine (BENTYL) 10 MG capsule, Take 1 capsule (10 mg total) by mouth 4 (four) times daily -  before meals and at bedtime., Disp: 360 capsule, Rfl: 0 .  ondansetron (ZOFRAN) 4 MG tablet, Take 1 tablet (4 mg total) by mouth every 8 (eight) hours as needed for nausea or vomiting. (Patient not taking: Reported on 10/02/2019), Disp: 20 tablet, Rfl: 0  Review of Systems  Constitutional: Positive for appetite change, chills, fatigue and fever.  HENT: Positive for congestion, postnasal drip, rhinorrhea, sneezing and sore throat. Negative for dental problem, ear pain, sinus pressure and sinus pain.   Respiratory: Positive for cough, chest tightness and wheezing.   Gastrointestinal: Negative for diarrhea, nausea and vomiting.  Neurological: Positive for weakness and headaches.    Social History   Tobacco Use  . Smoking status: Never Smoker  . Smokeless tobacco: Never Used  Substance Use Topics  . Alcohol use: No    Alcohol/week: 0.0 standard drinks    Comment: occasional      Objective:   There were no vitals taken for this visit. There were no vitals filed for this visit.There is no height or weight on file to calculate BMI.   Physical Exam   No results found for any visits on 10/02/19.     Assessment &  Plan    1. Bronchitis  3-4 days of coughing with rib pain and wheezing. Patient has not picked up levalbuterol which was previously prescribed. She will try and get this today. Recommend using this and prednisone taper. Will get CXR as below to r/o pneumonia. Return to work on 10/08/2018 based on timing of symptoms. Second COVID test pending.   - DG Chest 2 View; Future - predniSONE (DELTASONE) 10 MG tablet; Take 6 pills on day 1,  take 5 pills on day 2 and so on until complete.  Dispense: 21 tablet; Refill: 0   I discussed the assessment and treatment plan with the patient. The patient was provided an opportunity to ask questions and all were answered. The patient agreed with the plan and demonstrated an understanding of the instructions.   The patient was advised to call back or seek an in-person evaluation if the symptoms worsen or if the condition fails to improve as anticipated.  The entirety of the information documented in the History of Present Illness, Review of Systems and Physical Exam were personally obtained by me. Portions of this information were initially documented by [facility] and reviewed by me for thoroughness and accuracy.       Trinna Post, PA-C  [facility]

## 2019-10-02 ENCOUNTER — Ambulatory Visit (INDEPENDENT_AMBULATORY_CARE_PROVIDER_SITE_OTHER): Payer: Commercial Managed Care - PPO | Admitting: Physician Assistant

## 2019-10-02 ENCOUNTER — Ambulatory Visit
Admission: RE | Admit: 2019-10-02 | Discharge: 2019-10-02 | Disposition: A | Discharge status: Home / Self Care | Payer: Commercial Managed Care - PPO | Source: Ambulatory Visit | Attending: Physician Assistant | Admitting: Physician Assistant

## 2019-10-02 ENCOUNTER — Encounter: Payer: Self-pay | Admitting: Physician Assistant

## 2019-10-02 DIAGNOSIS — J40 Bronchitis, not specified as acute or chronic: Secondary | ICD-10-CM | POA: Insufficient documentation

## 2019-10-02 MED ORDER — PREDNISONE 10 MG PO TABS: ORAL_TABLET | ORAL | 0 refills | Status: DC

## 2019-10-02 NOTE — Telephone Encounter (Signed)
Can we print this out and I will sign? Thanks

## 2019-10-03 LAB — NOVEL CORONAVIRUS, NAA: SARS-CoV-2, NAA: NOT DETECTED

## 2019-10-08 ENCOUNTER — Encounter: Payer: Self-pay | Admitting: Physician Assistant

## 2019-10-08 NOTE — Telephone Encounter (Signed)
Can we change her visit on Wednesday to virtual? Thanks.

## 2019-10-09 NOTE — Progress Notes (Signed)
Patient: Erika Solomon Female    DOB: 08-May-1996   23 y.o.   MRN: [id number] Visit Date: 10/10/2019  Today's Provider: Trinna Post, PA-C   Chief Complaint  Patient presents with  . Cough   Subjective:    I, Porsha McClurkin,CMA am acting as a Education administrator for [employer].  Virtual Visit via Telephone Note  I connected with Erika Solomon on 10/10/19 at  8:20 AM EST by telephone and verified that I am speaking with the correct person using two identifiers.  Location: Patient: Home Provider: Office   I discussed the limitations, risks, security and privacy concerns of performing an evaluation and management service by telephone and the availability of in person appointments. I also discussed with the patient that there may be a patient responsible charge related to this service. The patient expressed understanding and agreed to proceed.  Cough This is a recurrent problem. The current episode started 1 to 4 weeks ago. The problem has been unchanged. The problem occurs constantly. The cough is productive of sputum. Associated symptoms include chest pain, chills, ear pain, a fever, headaches, nasal congestion, postnasal drip, a sore throat, shortness of breath and wheezing. Pertinent negatives include no rhinorrhea. The symptoms are aggravated by lying down.  Patient states that she is having body aches and loss of taste. She reports pain in rib when coughing and it hurts to breath. Last seen for this issue on 09/18/2018 and was prescribed levalbuterol. Seen again on 10/02/2019 for bronchitis and she had not started levalbuterol yet. She was started on prednisone taper x 6 days. Reports she is still having a cough. Reports she felt better for a time but now she has muscle aches. Fevers of 102. Tested for COVID on 10/01/2019 and was negative.     Allergies  Allergen Reactions  . Albuterol Anaphylaxis  . Benadryl [Diphenhydramine] Anaphylaxis  . Buspar [Buspirone]  Anaphylaxis  . Strawberry Extract Anaphylaxis     Current Outpatient Medications:  .  Eluxadoline (VIBERZI) 100 MG TABS, Take 1 tablet (100 mg total) by mouth 2 (two) times daily., Disp: 180 tablet, Rfl: 0 .  ibuprofen (ADVIL) 600 MG tablet, Take 1 tablet (600 mg total) by mouth every 8 (eight) hours as needed., Disp: 15 tablet, Rfl: 0 .  levalbuterol (XOPENEX HFA) 45 MCG/ACT inhaler, Inhale 2 puffs into the lungs every 6 (six) hours as needed for wheezing., Disp: 1 Inhaler, Rfl: 1 .  dicyclomine (BENTYL) 10 MG capsule, Take 1 capsule (10 mg total) by mouth 4 (four) times daily -  before meals and at bedtime., Disp: 360 capsule, Rfl: 0 .  ondansetron (ZOFRAN) 4 MG tablet, Take 1 tablet (4 mg total) by mouth every 8 (eight) hours as needed for nausea or vomiting. (Patient not taking: Reported on 10/02/2019), Disp: 20 tablet, Rfl: 0 .  predniSONE (DELTASONE) 10 MG tablet, Take 6 pills on day 1, take 5 pills on day 2 and so on until complete. (Patient not taking: Reported on 10/10/2019), Disp: 21 tablet, Rfl: 0  Review of Systems  Constitutional: Positive for appetite change, chills and fever. Negative for fatigue.  HENT: Positive for congestion, ear pain, postnasal drip and sore throat. Negative for dental problem, rhinorrhea, sinus pressure, sinus pain and sneezing.   Respiratory: Positive for cough, chest tightness, shortness of breath and wheezing.   Cardiovascular: Positive for chest pain.  Gastrointestinal: Positive for diarrhea.  Neurological: Positive for headaches.    Social History  Tobacco Use  . Smoking status: Never Smoker  . Smokeless tobacco: Never Used  Substance Use Topics  . Alcohol use: No    Alcohol/week: 0.0 standard drinks    Comment: occasional      Objective:   There were no vitals taken for this visit. There were no vitals filed for this visit.There is no height or weight on file to calculate BMI.   Physical Exam   No results found for any visits on  10/10/19.     Assessment & Plan    1. Fever, unspecified fever cause  Will treat empirically for pneumonia. Could be influenza at this time. Work note x 7 days with negative covid test on day 5.   - azithromycin (ZITHROMAX) 250 MG tablet; Take 2 pills on day 1, then 1 pill on each of the following 4 days.  Dispense: 6 tablet; Refill: 0 - predniSONE (DELTASONE) 10 MG tablet; Take 6 pills on day 1, 5 pills on day 2 and so on until complete.  Dispense: 21 tablet; Refill: 0 - promethazine-dextromethorphan (PROMETHAZINE-DM) 6.25-15 MG/5ML syrup; Take 5 mLs by mouth at bedtime as needed.  Dispense: 118 mL; Refill: 0  2. Cough  - azithromycin (ZITHROMAX) 250 MG tablet; Take 2 pills on day 1, then 1 pill on each of the following 4 days.  Dispense: 6 tablet; Refill: 0 - predniSONE (DELTASONE) 10 MG tablet; Take 6 pills on day 1, 5 pills on day 2 and so on until complete.  Dispense: 21 tablet; Refill: 0 - promethazine-dextromethorphan (PROMETHAZINE-DM) 6.25-15 MG/5ML syrup; Take 5 mLs by mouth at bedtime as needed.  Dispense: 118 mL; Refill: 0 I discussed the assessment and treatment plan with the patient. The patient was provided an opportunity to ask questions and all were answered. The patient agreed with the plan and demonstrated an understanding of the instructions.   The patient was advised to call back or seek an in-person evaluation if the symptoms worsen or if the condition fails to improve as anticipated.  I provided 25 minutes of non-face-to-face time during this encounter.     Trey Sailors, PA-C  [facility]

## 2019-10-10 ENCOUNTER — Telehealth (INDEPENDENT_AMBULATORY_CARE_PROVIDER_SITE_OTHER): Payer: Commercial Managed Care - PPO | Admitting: Physician Assistant

## 2019-10-10 DIAGNOSIS — R05 Cough: Secondary | ICD-10-CM

## 2019-10-10 DIAGNOSIS — R509 Fever, unspecified: Secondary | ICD-10-CM

## 2019-10-10 DIAGNOSIS — R059 Cough, unspecified: Secondary | ICD-10-CM

## 2019-10-10 MED ORDER — AZITHROMYCIN 250 MG PO TABS: ORAL_TABLET | ORAL | 0 refills | Status: DC

## 2019-10-10 MED ORDER — PREDNISONE 10 MG PO TABS: ORAL_TABLET | ORAL | 0 refills | Status: DC

## 2019-10-10 MED ORDER — PROMETHAZINE-DM 6.25-15 MG/5ML PO SYRP: 5.0000 mL | ORAL_SOLUTION | Freq: Every evening | ORAL | 0 refills | Status: DC | PRN

## 2019-10-15 ENCOUNTER — Ambulatory Visit: Payer: Commercial Managed Care - PPO | Attending: Internal Medicine

## 2019-10-15 DIAGNOSIS — Z20822 Contact with and (suspected) exposure to covid-19: Secondary | ICD-10-CM

## 2019-10-16 ENCOUNTER — Encounter: Payer: Self-pay | Admitting: Physician Assistant

## 2019-10-16 LAB — NOVEL CORONAVIRUS, NAA: SARS-CoV-2, NAA: NOT DETECTED

## 2019-10-16 IMAGING — CT CT MAXILLOFACIAL W/ CM
3 of 4 series · 15 of 47 positions shown, 18 images · IV contrast (omnipaque)
Comparison: 05/09/2017

CLINICAL DATA: Right-sided dental pain starting 2 days ago.
Right-sided facial swelling

EXAM:
CT MAXILLOFACIAL WITH CONTRAST
TECHNIQUE: Multidetector CT imaging of the maxillofacial structures was
performed with intravenous contrast. Multiplanar CT image
reconstructions were also generated.
CONTRAST:  75mL OMNIPAQUE IOHEXOL 300 MG/ML  SOLN

[Series 4: coronal soft · coronal · 0.31mm/px · 3 of 101 slices shown]
[im 34/101  bone]
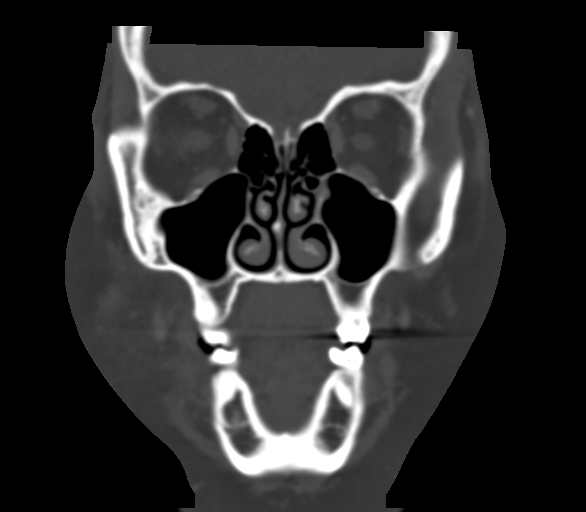
[im 45/101  bone]
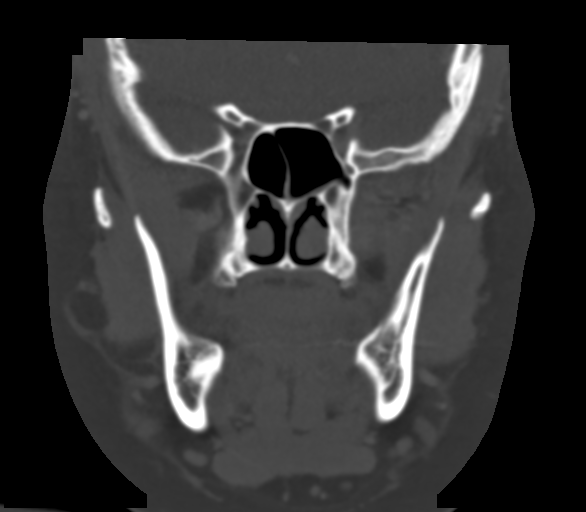
[im 56/101  bone]
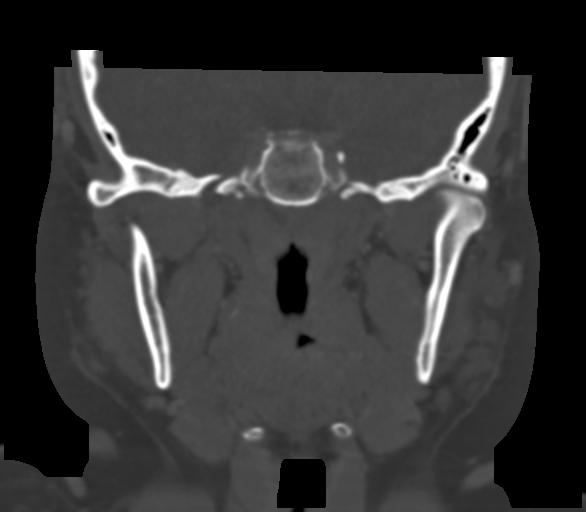

[Series 7: sagittal bone · sagittal · 0.32mm/px · 1 of 82 slices shown]
[im 41/82  bone]
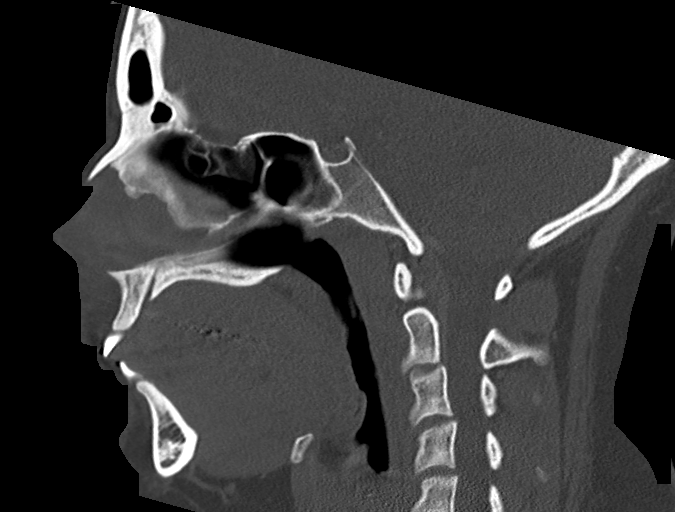

[Series 10: max (id) · axial · 0.37mm/px · z∈[-206,-80]mm · 11 of 75 slices shown, 14 images]
[im 6/75  brain]
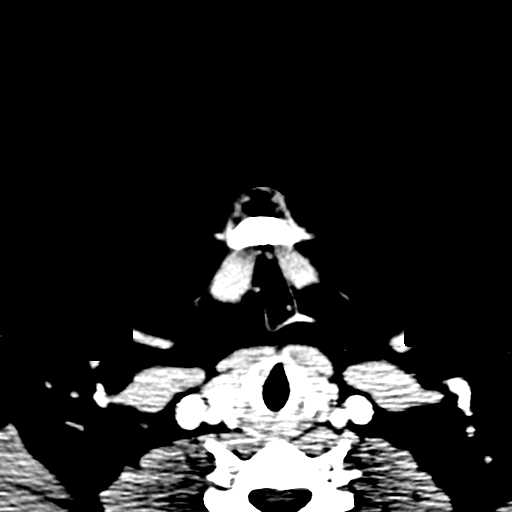
[im 6/75  bone]
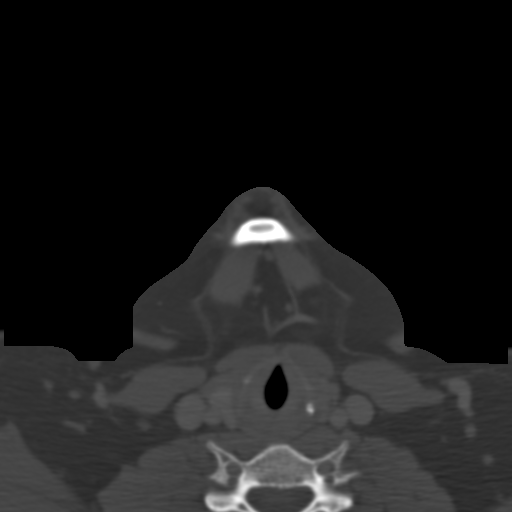
[im 11/75  bone]
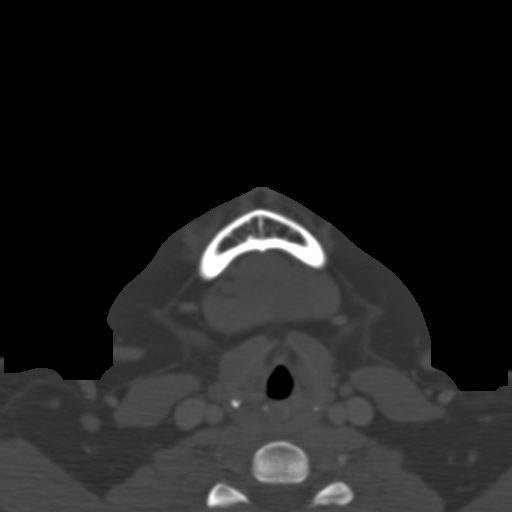
[im 18/75  bone]
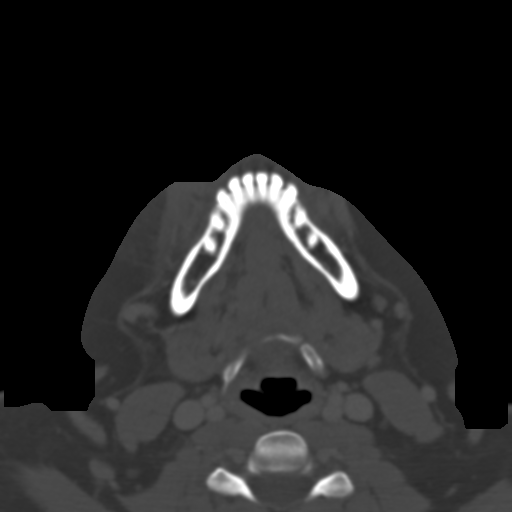
[im 23/75  bone]
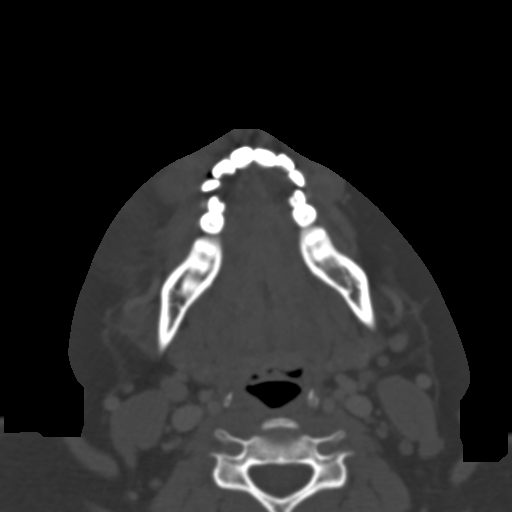
[im 31/75  brain]
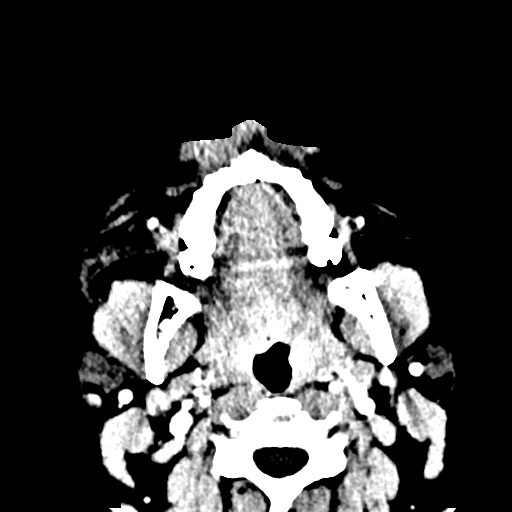
[im 31/75  bone]
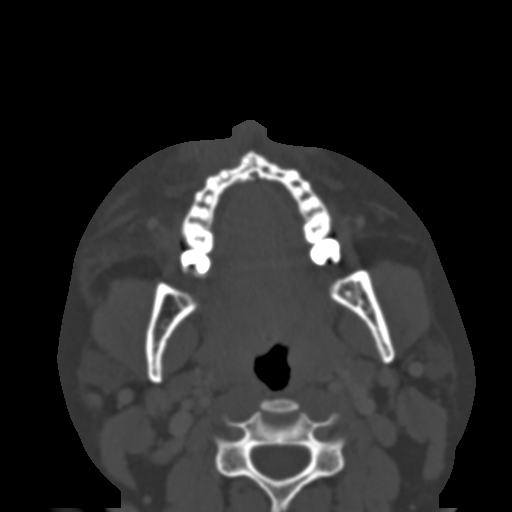
[im 39/75  bone]
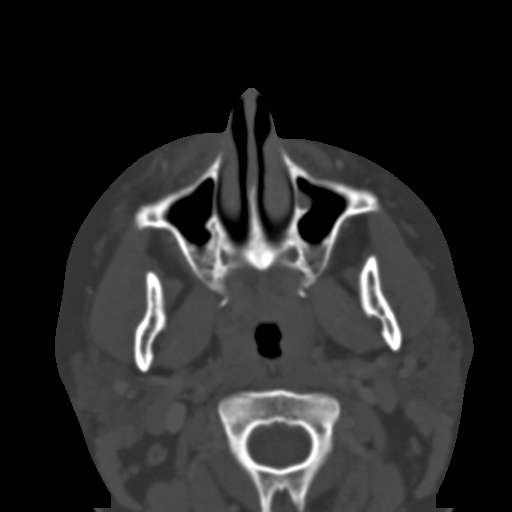
[im 44/75  bone]
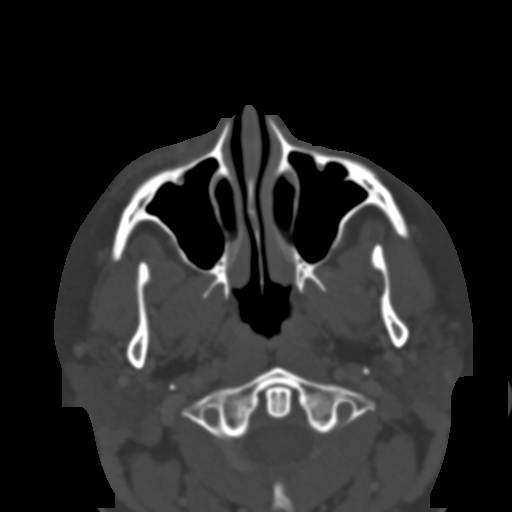
[im 52/75  bone]
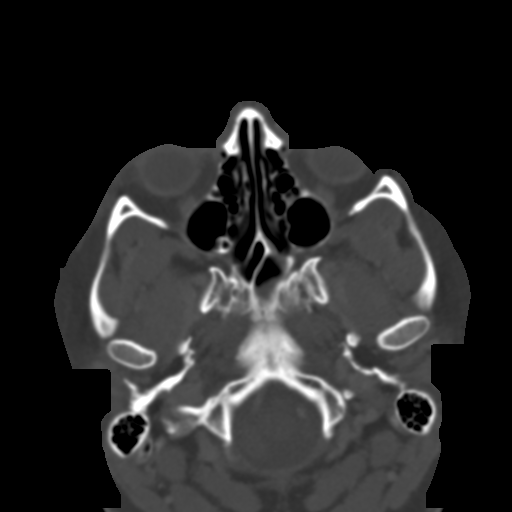
[im 57/75  brain]
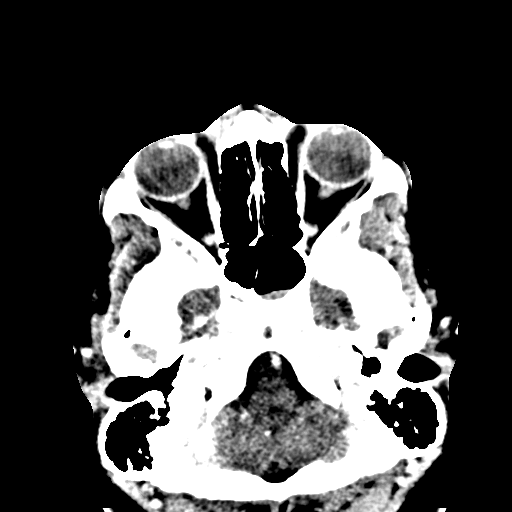
[im 57/75  bone]
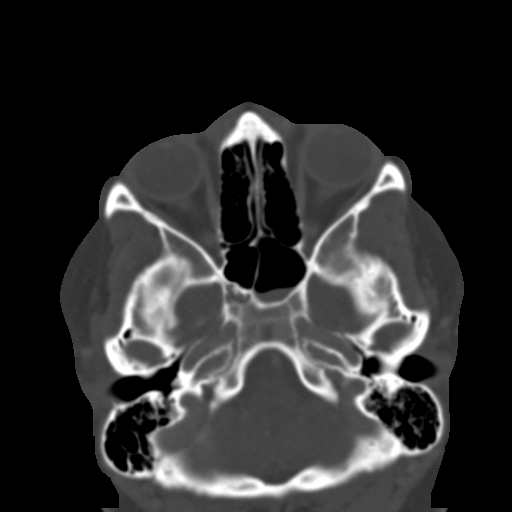
[im 64/75  bone]
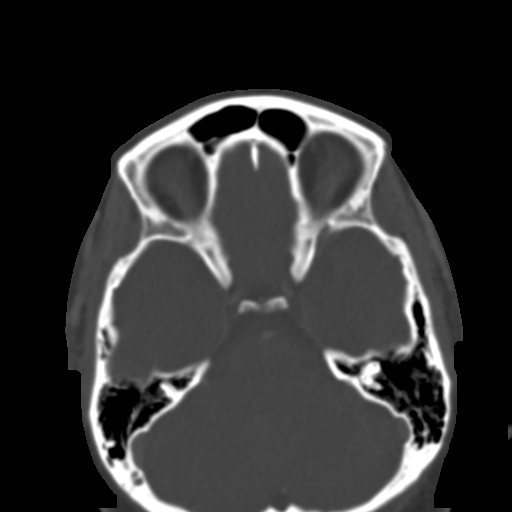
[im 69/75  bone]
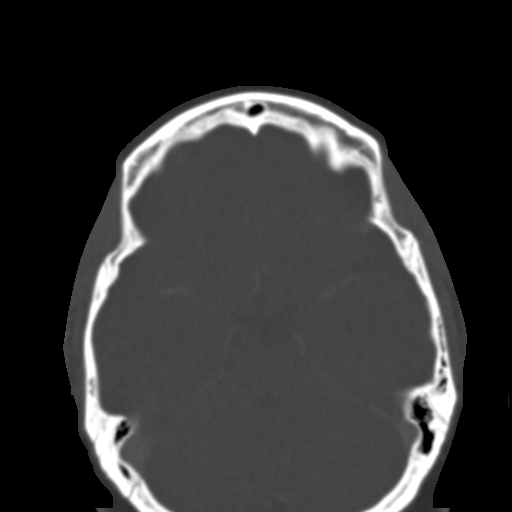

[15 of 47 positions shown; findings below may reference images not displayed]

FINDINGS: Osseous: No fracture or mandibular dislocation. No destructive
process. Periapical erosion around tooth 6, which has undergone
dental repair. Along the labial surface there is a 6 x 4 mm
low-density collection and regional inflammation.

Orbits: Subcutaneous fat stranding and expansion below the right
orbit. No postseptal inflammation. Normal appearance of the globes,
extraocular muscles, optic nerve sheath complexes, lacrimal glands,
and superior ophthalmic veins.

Sinuses: Mild mucosal thickening in the left sphenoid sinus.

Soft tissues: As above.  No opaque foreign body

Limited intracranial: Negative
IMPRESSION: 1. Odontogenic infection in the right face related to tooth #6. A 6
mm subperiosteal abscess is present along the labial cortex.
2. Discrete area of inflammation or contusion about the right orbit
without postseptal abnormality.

## 2019-10-25 ENCOUNTER — Ambulatory Visit: Payer: Commercial Managed Care - PPO | Admitting: Gastroenterology

## 2019-10-25 ENCOUNTER — Encounter: Payer: Self-pay | Admitting: Gastroenterology

## 2019-10-25 DIAGNOSIS — R197 Diarrhea, unspecified: Secondary | ICD-10-CM

## 2019-10-25 NOTE — Progress Notes (Deleted)
Gastroenterology Consultation  Referring Provider:     Paulene Floor Primary Care Physician:  Paulene Floor Primary Gastroenterologist:  Dr. Allen Norris     Reason for Consultation:     Diarrhea        HPI:   Erika Solomon is a 24 y.o. y/o female referred for consultation & management of diarrhea by Dr. Terrilee Croak, Wendee Beavers, PA-C.  This patient comes in today after being referred back in October by her PCP for diarrhea.  At that time the patient had requested that her PCP fill out at the [facility] papers and they had discussed that if her labs were normal that she would be sent for a GI referral.  It appears that the patient has had 5 test for Covid since November 16 with all of them being negative.  The last one was on January 11.  In September 2020 she had to negative Covid test.  The patient had a negative CRP and ESR and was also found to be TB negative.  Her TTG for celiac sprue was also negative.  Past Medical History:  Diagnosis Date  . Anemia   . Anxiety and depression   . Blood transfusion without reported diagnosis   . Hx of preeclampsia, prior pregnancy, currently pregnant   . Macrosomia   . Panic disorder   . Shoulder dystocia, delivered     Past Surgical History:  Procedure Laterality Date  . CESAREAN SECTION N/A 12/23/2016   Procedure: CESAREAN SECTION;  Surgeon: Malachy Mood, MD;  Location: [facility] ORS;  Service: Obstetrics;  Laterality: N/A;  . No past surgery      Prior to Admission medications   Medication Sig Start Date End Date Taking? Authorizing Provider  azithromycin (ZITHROMAX) 250 MG tablet Take 2 pills on day 1, then 1 pill on each of the following 4 days. 10/10/19   Trinna Post, PA-C  dicyclomine (BENTYL) 10 MG capsule Take 1 capsule (10 mg total) by mouth 4 (four) times daily -  before meals and at bedtime. 06/29/19 09/27/19  Trinna Post, PA-C  Eluxadoline (VIBERZI) 100 MG TABS Take 1 tablet (100 mg total) by mouth 2 (two) times daily.  06/29/19   Trinna Post, PA-C  ibuprofen (ADVIL) 600 MG tablet Take 1 tablet (600 mg total) by mouth every 8 (eight) hours as needed. 05/02/19   Sable Feil, PA-C  levalbuterol [facility] HFA) 45 MCG/ACT inhaler Inhale 2 puffs into the lungs every 6 (six) hours as needed for wheezing. 09/19/19   Trinna Post, PA-C  ondansetron (ZOFRAN) 4 MG tablet Take 1 tablet (4 mg total) by mouth every 8 (eight) hours as needed for nausea or vomiting. Patient not taking: Reported on 10/02/2019 06/29/19   Trinna Post, PA-C  predniSONE (DELTASONE) 10 MG tablet Take 6 pills on day 1, 5 pills on day 2 and so on until complete. 10/10/19   Trinna Post, PA-C  promethazine-dextromethorphan (PROMETHAZINE-DM) 6.25-15 MG/5ML syrup Take 5 mLs by mouth at bedtime as needed. 10/10/19   Trinna Post, PA-C    Family History  Problem Relation Age of Onset  . Diabetes Maternal Grandfather   . Hypertension Father   . Hypertension Maternal Grandmother   . Thyroid disease Mother      Social History   Tobacco Use  . Smoking status: Never Smoker  . Smokeless tobacco: Never Used  Substance Use Topics  . Alcohol use: No    Alcohol/week:  0.0 standard drinks    Comment: occasional  . Drug use: No    Allergies as of 10/25/2019 - Review Complete 10/10/2019  Allergen Reaction Noted  . Albuterol Anaphylaxis 02/12/2015  . Benadryl [diphenhydramine] Anaphylaxis 02/12/2015  . Buspar [buspirone] Anaphylaxis 05/21/2015  . Strawberry extract Anaphylaxis 05/21/2015    Review of Systems:    All systems reviewed and negative except where noted in HPI.   Physical Exam:  There were no vitals taken for this visit. No LMP recorded. Patient has had an implant. General:   Alert,  Well-developed, well-nourished, pleasant and cooperative in NAD Head:  Normocephalic and atraumatic. Eyes:  Sclera clear, no icterus.   Conjunctiva pink. Ears:  Normal auditory acuity. Neck:  Supple; no masses or  thyromegaly. Lungs:  Respirations even and unlabored.  Clear throughout to auscultation.   No wheezes, crackles, or rhonchi. No acute distress. Heart:  Regular rate and rhythm; no murmurs, clicks, rubs, or gallops. Abdomen:  Normal bowel sounds.  No bruits.  Soft, non-tender and non-distended without masses, hepatosplenomegaly or hernias noted.  No guarding or rebound tenderness.  Negative Carnett sign.   Rectal:  Deferred.  Pulses:  Normal pulses noted. Extremities:  No clubbing or edema.  No cyanosis. Neurologic:  Alert and oriented x3;  grossly normal neurologically. Skin:  Intact without significant lesions or rashes.  No jaundice. Lymph Nodes:  No significant cervical adenopathy. Psych:  Alert and cooperative. Normal mood and affect.  Imaging Studies: DG Chest 2 View  Result Date: 10/02/2019 CLINICAL DATA:  Cough and fever with shortness of breath EXAM: CHEST - 2 VIEW COMPARISON:  May 09, 2017 FINDINGS: The lungs are clear. The heart size and pulmonary vascularity are normal. No adenopathy. No bone lesions. IMPRESSION: Lungs clear.  No evident adenopathy. Electronically Signed   By: Lowella Grip III M.D.   On: 10/02/2019 15:07    Assessment and Plan:   Erika Solomon is a 24 y.o. y/o female ***    Lucilla Lame, MD. [facility]    Note: This dictation was prepared with Dragon dictation along with smaller phrase technology. Any transcriptional errors that result from this process are unintentional.

## 2019-11-01 ENCOUNTER — Encounter: Payer: Self-pay | Admitting: Physician Assistant

## 2019-12-02 ENCOUNTER — Encounter: Payer: Self-pay | Admitting: Physician Assistant

## 2019-12-03 ENCOUNTER — Telehealth: Payer: Self-pay

## 2019-12-03 NOTE — Telephone Encounter (Signed)
Called patient

## 2019-12-03 NOTE — Telephone Encounter (Signed)
Called patient and advised patient that she would have to schedule appointment in office. Patient states that she will call the office back to schedule appointment.

## 2019-12-10 ENCOUNTER — Ambulatory Visit: Payer: Self-pay | Attending: Internal Medicine

## 2019-12-10 DIAGNOSIS — Z20822 Contact with and (suspected) exposure to covid-19: Secondary | ICD-10-CM | POA: Insufficient documentation

## 2019-12-11 LAB — NOVEL CORONAVIRUS, NAA: SARS-CoV-2, NAA: NOT DETECTED

## 2020-02-22 ENCOUNTER — Ambulatory Visit (INDEPENDENT_AMBULATORY_CARE_PROVIDER_SITE_OTHER)
Admission: RE | Admit: 2020-02-22 | Discharge: 2020-02-22 | Disposition: A | Discharge status: Other Acute Inpatient Hospital | Payer: Self-pay | Source: Ambulatory Visit

## 2020-02-22 ENCOUNTER — Ambulatory Visit
Admission: EM | Admit: 2020-02-22 | Discharge: 2020-02-22 | Disposition: A | Discharge status: Home / Self Care | Payer: Self-pay | Attending: Emergency Medicine | Admitting: Emergency Medicine

## 2020-02-22 ENCOUNTER — Other Ambulatory Visit: Payer: Self-pay

## 2020-02-22 DIAGNOSIS — J020 Streptococcal pharyngitis: Secondary | ICD-10-CM

## 2020-02-22 DIAGNOSIS — J029 Acute pharyngitis, unspecified: Secondary | ICD-10-CM

## 2020-02-22 DIAGNOSIS — J069 Acute upper respiratory infection, unspecified: Secondary | ICD-10-CM

## 2020-02-22 DIAGNOSIS — R197 Diarrhea, unspecified: Secondary | ICD-10-CM

## 2020-02-22 DIAGNOSIS — J039 Acute tonsillitis, unspecified: Secondary | ICD-10-CM

## 2020-02-22 LAB — POCT RAPID STREP A (OFFICE): Rapid Strep A Screen: NEGATIVE

## 2020-02-22 MED ORDER — PENICILLIN G BENZATHINE 1200000 UNIT/2ML IM SUSP
1.2000 10*6.[IU] | Freq: Once | INTRAMUSCULAR | Status: AC
  Administered 2020-02-22: 1.2 10*6.[IU] via INTRAMUSCULAR

## 2020-02-22 NOTE — ED Provider Notes (Signed)
Erika Solomon    CSN: [id number] Arrival date & time: 02/22/20  9924      History   Chief Complaint Chief Complaint  Patient presents with  . Sore Throat  . Nasal Congestion  . Diarrhea    HPI Erika Solomon is a 24 y.o. female.  Patient presents with 5 day history of sore throat, runny nose, ulcer on tonsil, nonproductive cough, fever, chills, and diarrhea.  Tmax 102 yesterday.  She reports she is unable to eat well due to sore throat.  She denies shortness of breath, abdominal pain, vomiting, or other symptoms.  Treatment attempted at home with Tylenol and Mucinex.    The history is provided by the patient.    Past Medical History:  Diagnosis Date  . Anemia   . Anxiety and depression   . Blood transfusion without reported diagnosis   . Hx of preeclampsia, prior pregnancy, currently pregnant   . Macrosomia   . Panic disorder   . Shoulder dystocia, delivered     Patient Active Problem List   Diagnosis Date Noted  . Iron deficiency anemia 12/26/2016  . Postpartum care following cesarean delivery 12/26/2016  . Maternal varicella, non-immune 10/05/2016  . Anemia of pregnancy in second trimester 10/05/2016  . UTI in pregnancy, antepartum 05/24/2016    Past Surgical History:  Procedure Laterality Date  . CESAREAN SECTION N/A 12/23/2016   Procedure: CESAREAN SECTION;  Surgeon: Vena Austria, MD;  Location: [facility] ORS;  Service: Obstetrics;  Laterality: N/A;  . No past surgery      OB History    Gravida  4   Para  2   Term  2   Preterm  0   AB  0   Living  2     SAB  0   TAB  0   Ectopic  0   Multiple  0   Live Births  2        Obstetric Comments  Induced at 39 weeks in second pregnancy for pre-eclampsia.          Home Medications    Prior to Admission medications   Medication Sig Start Date End Date Taking? Authorizing Provider  azithromycin (ZITHROMAX) 250 MG tablet Take 2 pills on day 1, then 1 pill on each of the following  4 days. 10/10/19   Trey Sailors, PA-C  dicyclomine (BENTYL) 10 MG capsule Take 1 capsule (10 mg total) by mouth 4 (four) times daily -  before meals and at bedtime. 06/29/19 09/27/19  Trey Sailors, PA-C  Eluxadoline (VIBERZI) 100 MG TABS Take 1 tablet (100 mg total) by mouth 2 (two) times daily. 06/29/19   Trey Sailors, PA-C  ibuprofen (ADVIL) 600 MG tablet Take 1 tablet (600 mg total) by mouth every 8 (eight) hours as needed. 05/02/19   Joni Reining, PA-C  levalbuterol [facility] HFA) 45 MCG/ACT inhaler Inhale 2 puffs into the lungs every 6 (six) hours as needed for wheezing. 09/19/19   Trey Sailors, PA-C  ondansetron (ZOFRAN) 4 MG tablet Take 1 tablet (4 mg total) by mouth every 8 (eight) hours as needed for nausea or vomiting. Patient not taking: Reported on 10/02/2019 06/29/19   Trey Sailors, PA-C  predniSONE (DELTASONE) 10 MG tablet Take 6 pills on day 1, 5 pills on day 2 and so on until complete. 10/10/19   Trey Sailors, PA-C  promethazine-dextromethorphan (PROMETHAZINE-DM) 6.25-15 MG/5ML syrup Take 5 mLs by mouth at bedtime as  needed. 10/10/19   Trinna Post, PA-C    Family History Family History  Problem Relation Age of Onset  . Diabetes Maternal Grandfather   . Hypertension Father   . Hypertension Maternal Grandmother   . Thyroid disease Mother     Social History Social History   Tobacco Use  . Smoking status: Never Smoker  . Smokeless tobacco: Never Used  Substance Use Topics  . Alcohol use: No    Alcohol/week: 0.0 standard drinks    Comment: occasional  . Drug use: No     Allergies   Albuterol, Benadryl [diphenhydramine], Buspar [buspirone], and Strawberry extract   Review of Systems Review of Systems  Constitutional: Positive for chills and fever.  HENT: Positive for congestion, rhinorrhea and sore throat. Negative for ear pain.   Eyes: Negative for pain and visual disturbance.  Respiratory: Positive for cough. Negative for shortness of  breath.   Cardiovascular: Negative for chest pain and palpitations.  Gastrointestinal: Positive for diarrhea. Negative for abdominal pain, nausea and vomiting.  Genitourinary: Negative for dysuria and hematuria.  Musculoskeletal: Negative for arthralgias and back pain.  Skin: Negative for color change and rash.  Neurological: Negative for seizures and syncope.  All other systems reviewed and are negative.    Physical Exam Triage Vital Signs ED Triage Vitals  Enc Vitals Group     BP      Pulse      Resp      Temp      Temp src      SpO2      Weight      Height      Head Circumference      Peak Flow      Pain Score      Pain Loc      Pain Edu?      Excl. in [place]?    No data found.  Updated Vital Signs BP 129/83 (BP Location: Left Arm)   Pulse 75   Temp 98 F (36.7 C) (Oral)   Resp 19   LMP  (Within Weeks) Comment: 3 weeks  SpO2 96%   Visual Acuity Right Eye Distance:   Left Eye Distance:   Bilateral Distance:    Right Eye Near:   Left Eye Near:    Bilateral Near:     Physical Exam Vitals and nursing note reviewed.  Constitutional:      General: She is not in acute distress.    Appearance: She is well-developed.  HENT:     Head: Normocephalic and atraumatic.     Right Ear: Tympanic membrane normal.     Left Ear: Tympanic membrane normal.     Nose: Congestion and rhinorrhea present.     Mouth/Throat:     Mouth: Mucous membranes are moist.     Pharynx: Posterior oropharyngeal erythema present. No oropharyngeal exudate.     Tonsils: 2+ on the right. 1+ on the left.     Comments: Posterior pharynx beefy red R>L.  Able to swallow without difficulty.  Speech clear.  Eyes:     Conjunctiva/sclera: Conjunctivae normal.  Cardiovascular:     Rate and Rhythm: Normal rate and regular rhythm.     Heart sounds: No murmur.  Pulmonary:     Effort: Pulmonary effort is normal. No respiratory distress.     Breath sounds: Normal breath sounds.  Abdominal:      Palpations: Abdomen is soft.     Tenderness: There is no abdominal tenderness. There  is no guarding or rebound.  Musculoskeletal:     Cervical back: Neck supple.  Skin:    General: Skin is warm and dry.     Findings: No rash.  Neurological:     General: No focal deficit present.     Mental Status: She is alert and oriented to person, place, and time.     Gait: Gait normal.  Psychiatric:        Mood and Affect: Mood normal.        Behavior: Behavior normal.      UC Treatments / Results  Labs (all labs ordered are listed, but only abnormal results are displayed) Labs Reviewed  NOVEL CORONAVIRUS, NAA  CULTURE, GROUP A STREP [facility])  POCT RAPID STREP A (OFFICE)    EKG   Radiology No results found.  Procedures Procedures (including critical care time)  Medications Ordered in UC Medications  penicillin g benzathine (BICILLIN LA) 1200000 UNIT/2ML injection 1.2 Million Units (has no administration in time range)    Initial Impression / Assessment and Plan / UC Course  I have reviewed the triage vital signs and the nursing notes.  Pertinent labs & imaging results that were available during my care of the patient were reviewed by me and considered in my medical decision making (see chart for details).   Acute pharyngitis, acute tonsillitis, URI, diarrhea.  Rapid strep negative, throat culture pending.  PCR COVID pending.  Instructed patient to self quarantine until the COVID test result is back.  Continue Tylenol or ibuprofen as needed for fever or discomfort.  BRAT diet for diarrhea.  Instructed her to go to the emergency department if she has difficulty swallowing or breathing.  Instructed her to follow-up with her PCP if her symptoms or not improving.  Patient agrees to plan of care.     Final Clinical Impressions(s) / UC Diagnoses   Final diagnoses:  Acute pharyngitis, unspecified etiology  Acute tonsillitis, unspecified etiology  Upper respiratory tract infection,  unspecified type  Diarrhea, unspecified type     Discharge Instructions     You were given an injection of long-acting penicillin today.    Your COVID test is pending.  You should self quarantine until the test result is back.    Take Tylenol or ibuprofen as needed for fever or discomfort.  Rest and keep yourself hydrated.    Go to the emergency department if you develop difficulty swallowing, shortness of breath, severe diarrhea, high fever not relieved by Tylenol or ibuprofen, or other concerning symptoms.         ED Prescriptions    None     PDMP not reviewed this encounter.   Mickie Bail, NP 02/22/20 1028

## 2020-02-22 NOTE — ED Provider Notes (Signed)
Virtual Visit via Video Note:  Erika Solomon  initiated request for Telemedicine visit with [facility] Urgent Care team. I connected with Erika Solomon  on 02/22/2020 at 9:41 AM  for a synchronized telemedicine visit using a video enabled HIPPA compliant telemedicine application. I verified that I am speaking with Erika Solomon  using two identifiers. Mickie Bail, NP  was physically located in a [facility] Urgent care site and Erika Solomon was located at a different location.   The limitations of evaluation and management by telemedicine as well as the availability of in-person appointments were discussed. Patient was informed that she  may incur a bill ( including co-pay) for this virtual visit encounter. Erika Solomon  expressed understanding and gave verbal consent to proceed with virtual visit.     History of Present Illness:Erika Solomon  is a 24 y.o. female presents for evaluation of 5 day history of sore throat, ulcer on tonsil, runny nose, cough, diarrhea.  She states it is difficult to eat because of the sore throat.     Allergies  Allergen Reactions  . Albuterol Anaphylaxis  . Benadryl [Diphenhydramine] Anaphylaxis  . Buspar [Buspirone] Anaphylaxis  . Strawberry Extract Anaphylaxis     Past Medical History:  Diagnosis Date  . Anemia   . Anxiety and depression   . Blood transfusion without reported diagnosis   . Hx of preeclampsia, prior pregnancy, currently pregnant   . Macrosomia   . Panic disorder   . Shoulder dystocia, delivered      Social History   Tobacco Use  . Smoking status: Never Smoker  . Smokeless tobacco: Never Used  Substance Use Topics  . Alcohol use: No    Alcohol/week: 0.0 standard drinks    Comment: occasional  . Drug use: No        Observations/Objective: Physical Exam  VITALS: Patient denies fever. GENERAL: Alert, appears well and in no acute distress. HEENT: Atraumatic. NECK: Normal movements of the head and  neck. CARDIOPULMONARY: No increased WOB. Speaking in clear sentences. I:E ratio WNL.  MS: Moves all visible extremities without noticeable abnormality. PSYCH: Pleasant and cooperative, well-groomed. Speech normal rate and rhythm. Affect is appropriate. Insight and judgement are appropriate. Attention is focused, linear, and appropriate.  NEURO: CN grossly intact. Oriented as arrived to appointment on time with no prompting. Moves both UE equally.  SKIN: No obvious lesions, wounds, erythema, or cyanosis noted on face or hands.   Assessment and Plan:    ICD-10-CM   1. Sore throat  J02.9        Follow Up Instructions: Discussed with patient that she needs to be seen in person for diagnostic test, including strep and COVID.  Patient readily agrees and will come to [facility].      I discussed the assessment and treatment plan with the patient. The patient was provided an opportunity to ask questions and all were answered. The patient agreed with the plan and demonstrated an understanding of the instructions.   The patient was advised to call back or seek an in-person evaluation if the symptoms worsen or if the condition fails to improve as anticipated.      Mickie Bail, NP  02/22/2020 9:41 AM         Mickie Bail, NP 02/22/20 [phone]

## 2020-02-22 NOTE — ED Triage Notes (Signed)
Pt reports sore throat, body aches, runny nose, headaches, diarrhea and cough x 5 days. Pt states is painful to swallow. Pt tried Mucinex without relief.

## 2020-02-22 NOTE — Discharge Instructions (Addendum)
You were given an injection of long-acting penicillin today.    Your COVID test is pending.  You should self quarantine until the test result is back.    Take Tylenol or ibuprofen as needed for fever or discomfort.  Rest and keep yourself hydrated.    Go to the emergency department if you develop difficulty swallowing, shortness of breath, severe diarrhea, high fever not relieved by Tylenol or ibuprofen, or other concerning symptoms.

## 2020-02-22 NOTE — Discharge Instructions (Addendum)
Come to the Urgent Care for in-person evaluation.   

## 2020-02-23 LAB — NOVEL CORONAVIRUS, NAA: SARS-CoV-2, NAA: NOT DETECTED

## 2020-02-23 LAB — SARS-COV-2, NAA 2 DAY TAT

## 2020-02-24 LAB — CULTURE, GROUP A STREP (THRC)

## 2020-04-07 ENCOUNTER — Ambulatory Visit (INDEPENDENT_AMBULATORY_CARE_PROVIDER_SITE_OTHER)
Admission: RE | Admit: 2020-04-07 | Discharge: 2020-04-07 | Disposition: A | Discharge status: Home / Self Care | Payer: Self-pay | Source: Ambulatory Visit

## 2020-04-07 DIAGNOSIS — B349 Viral infection, unspecified: Secondary | ICD-10-CM

## 2020-04-07 MED ORDER — ONDANSETRON HCL 4 MG PO TABS
4.0000 mg | ORAL_TABLET | Freq: Four times a day (QID) | ORAL | 0 refills | Status: DC | PRN
Start: 2020-04-07 — End: 2020-07-08

## 2020-04-07 NOTE — ED Provider Notes (Signed)
Virtual Visit via Video Note:  Erika Solomon  initiated request for Telemedicine visit with [facility] Urgent Care team. I connected with Alois Cliche  on 04/07/2020 at 5:08 PM  for a synchronized telemedicine visit using a video enabled HIPPA compliant telemedicine application. I verified that I am speaking with Alois Cliche  using two identifiers. Mickie Bail, NP  was physically located in a [facility] Urgent care site and Erika Solomon was located at a different location.   The limitations of evaluation and management by telemedicine as well as the availability of in-person appointments were discussed. Patient was informed that she  may incur a bill ( including co-pay) for this virtual visit encounter. Erika Solomon  expressed understanding and gave verbal consent to proceed with virtual visit.     History of Present Illness:Erika Solomon  is a 24 y.o. female presents for evaluation of fever 100.4 - 101.4 and cough x 3 days.  Today she has body aches, nausea, vomiting, and diarrhea.  She missed work today and doesn't feel like she will be able to go.  She denies rash, sore throat, shortness of breath, abdominal pain, or other symptoms.  Her children had similar symptoms last week.  She denies current pregnancy or breastfeeding.      Allergies  Allergen Reactions  . Albuterol Anaphylaxis  . Benadryl [Diphenhydramine] Anaphylaxis  . Buspar [Buspirone] Anaphylaxis  . Strawberry Extract Anaphylaxis     Past Medical History:  Diagnosis Date  . Anemia   . Anxiety and depression   . Blood transfusion without reported diagnosis   . Hx of preeclampsia, prior pregnancy, currently pregnant   . Macrosomia   . Panic disorder   . Shoulder dystocia, delivered      Social History   Tobacco Use  . Smoking status: Never Smoker  . Smokeless tobacco: Never Used  Vaping Use  . Vaping Use: Never used  Substance Use Topics  . Alcohol use: No    Alcohol/week: 0.0 standard drinks     Comment: occasional  . Drug use: No   ROS: as stated in HPI.  All other systems reviewed and negative.      Observations/Objective: Physical Exam  VITALS: Patient denies fever. GENERAL: Alert, appears well and in no acute distress. HEENT: Atraumatic. Oral mucosa appears moist. NECK: Normal movements of the head and neck. CARDIOPULMONARY: No increased WOB. Speaking in clear sentences. I:E ratio WNL.  MS: Moves all visible extremities without noticeable abnormality. PSYCH: Pleasant and cooperative, well-groomed. Speech normal rate and rhythm. Affect is appropriate. Insight and judgement are appropriate. Attention is focused, linear, and appropriate.  NEURO: CN grossly intact. Oriented as arrived to appointment on time with no prompting. Moves both UE equally.  SKIN: No obvious lesions, wounds, erythema, or cyanosis noted on face or hands.   Assessment and Plan:    ICD-10-CM   1. Viral illness  B34.9        Follow Up Instructions: Treating with Zofran as needed for n/v.  Instructed patient to take Tylenol as needed for fever or discomfort.  Instructed her to stay hydrated with clear liquids.  Instructed her to follow-up with her PCP or come to the urgent care to be seen in person if her symptoms are not improving.  Patient agrees to plan of care.      I discussed the assessment and treatment plan with the patient. The patient was provided an opportunity to ask questions and all  were answered. The patient agreed with the plan and demonstrated an understanding of the instructions.   The patient was advised to call back or seek an in-person evaluation if the symptoms worsen or if the condition fails to improve as anticipated.      Mickie Bail, NP  04/07/2020 5:08 PM         Mickie Bail, NP 04/07/20 1708

## 2020-04-07 NOTE — Discharge Instructions (Signed)
Take the antinausea medication as directed.    Keep yourself hydrated with clear liquids, such as water, Gatorade, Pedialyte, Sprite, or ginger ale.    Follow up with your primary care provider or come here to be seen in person if your symptoms are not improving.      

## 2020-04-09 NOTE — Progress Notes (Signed)
MyChart Video Visit    Virtual Visit via Video Note   This visit type was conducted due to national recommendations for restrictions regarding the COVID-19 Pandemic (e.g. social distancing) in an effort to limit this patient's exposure and mitigate transmission in our community. This patient is at least at moderate risk for complications without adequate follow up. This format is felt to be most appropriate for this patient at this time. Physical exam was limited by quality of the video and audio technology used for the visit.   Patient location: Home Provider location: Office    Patient: Erika Solomon   DOB: January 29, 1996   24 y.o. Female  MRN: [id number] Visit Date: 04/10/2020  Today's healthcare provider: Trey Sailors, PA-C   No chief complaint on file.  Subjective    Emesis  This is a new problem. The current episode started in the past 7 days. The maximum temperature recorded prior to her arrival was 101 - 101.9 F (100.4). The fever has been present for 1 to 2 days. Associated symptoms include a fever.  Kids were sick with similar symptoms. Vomiting and diarrhea x several days, onset since Monday. She reports abdominal pain since Monday. She reports abdominal pain across the top of her abdomen. She denies in blood in her stool. Patient reports she needs a work note as well.      Medications: Outpatient Medications Prior to Visit  Medication Sig  . azithromycin (ZITHROMAX) 250 MG tablet Take 2 pills on day 1, then 1 pill on each of the following 4 days.  Marland Kitchen dicyclomine (BENTYL) 10 MG capsule Take 1 capsule (10 mg total) by mouth 4 (four) times daily -  before meals and at bedtime.  . Eluxadoline (VIBERZI) 100 MG TABS Take 1 tablet (100 mg total) by mouth 2 (two) times daily.  Marland Kitchen ibuprofen (ADVIL) 600 MG tablet Take 1 tablet (600 mg total) by mouth every 8 (eight) hours as needed.  . levalbuterol (XOPENEX HFA) 45 MCG/ACT inhaler Inhale 2 puffs into the lungs every 6 (six)  hours as needed for wheezing.  . ondansetron (ZOFRAN) 4 MG tablet Take 1 tablet (4 mg total) by mouth every 6 (six) hours as needed for nausea or vomiting.  . predniSONE (DELTASONE) 10 MG tablet Take 6 pills on day 1, 5 pills on day 2 and so on until complete.  . promethazine-dextromethorphan (PROMETHAZINE-DM) 6.25-15 MG/5ML syrup Take 5 mLs by mouth at bedtime as needed.   No facility-administered medications prior to visit.    Review of Systems  Constitutional: Positive for fever.  Gastrointestinal: Positive for vomiting.      Objective    There were no vitals taken for this visit.   Physical Exam Constitutional:      Appearance: Normal appearance.  Neurological:     Mental Status: She is alert and oriented to person, place, and time. Mental status is at baseline.  Psychiatric:        Mood and Affect: Mood normal.        Behavior: Behavior normal.        Assessment & Plan    1. Nausea and vomiting, intractability of vomiting not specified, unspecified vomiting type  DDx: viral gastroenteritis, bacterial gastroenteritis, appendicitis, colitis. Advised to r/o appendicitis she would need CT in the ER setting. She would also likely benefit from IV fluids at this point as her symptoms are consistent with dehydration. Patient would like to wait to see if symptoms improve. Will try  phenergan. Work note written. Advised return precautions.  - promethazine (PHENERGAN) 12.5 MG tablet; Take 1 tablet (12.5 mg total) by mouth every 8 (eight) hours as needed for nausea or vomiting.  Dispense: 20 tablet; Refill: 0  .  No follow-ups on file.     I discussed the assessment and treatment plan with the patient. The patient was provided an opportunity to ask questions and all were answered. The patient agreed with the plan and demonstrated an understanding of the instructions.   The patient was advised to call back or seek an in-person evaluation if the symptoms worsen or if the condition  fails to improve as anticipated.    ITrey Sailors, PA-C, have reviewed all documentation for this visit. The documentation on 04/11/20 for the exam, diagnosis, procedures, and orders are all accurate and complete.   Maryella Shivers [facility] [phone] (phone) [phone] (fax)  [facility]

## 2020-04-10 ENCOUNTER — Telehealth (INDEPENDENT_AMBULATORY_CARE_PROVIDER_SITE_OTHER): Payer: Commercial Managed Care - PPO | Admitting: Physician Assistant

## 2020-04-10 DIAGNOSIS — R112 Nausea with vomiting, unspecified: Secondary | ICD-10-CM

## 2020-04-10 MED ORDER — PROMETHAZINE HCL 12.5 MG PO TABS: 12.5000 mg | ORAL_TABLET | Freq: Three times a day (TID) | ORAL | 0 refills | Status: DC | PRN

## 2020-04-11 NOTE — Patient Instructions (Signed)

## 2020-04-22 NOTE — Progress Notes (Deleted)
     Established patient visit   Patient: Erika Solomon   DOB: January 30, 1996   23 y.o. Female  MRN: [id number] Visit Date: 04/23/2020  Today's healthcare provider: Trey Sailors, PA-C   No chief complaint on file.  Subjective    Abdominal Pain    ***  {Show patient history (optional):23778::" "}   Medications: Outpatient Medications Prior to Visit  Medication Sig  . azithromycin (ZITHROMAX) 250 MG tablet Take 2 pills on day 1, then 1 pill on each of the following 4 days.  Marland Kitchen dicyclomine (BENTYL) 10 MG capsule Take 1 capsule (10 mg total) by mouth 4 (four) times daily -  before meals and at bedtime.  . Eluxadoline (VIBERZI) 100 MG TABS Take 1 tablet (100 mg total) by mouth 2 (two) times daily.  Marland Kitchen ibuprofen (ADVIL) 600 MG tablet Take 1 tablet (600 mg total) by mouth every 8 (eight) hours as needed.  . levalbuterol (XOPENEX HFA) 45 MCG/ACT inhaler Inhale 2 puffs into the lungs every 6 (six) hours as needed for wheezing.  . ondansetron (ZOFRAN) 4 MG tablet Take 1 tablet (4 mg total) by mouth every 6 (six) hours as needed for nausea or vomiting.  . predniSONE (DELTASONE) 10 MG tablet Take 6 pills on day 1, 5 pills on day 2 and so on until complete.  . promethazine (PHENERGAN) 12.5 MG tablet Take 1 tablet (12.5 mg total) by mouth every 8 (eight) hours as needed for nausea or vomiting.  . promethazine-dextromethorphan (PROMETHAZINE-DM) 6.25-15 MG/5ML syrup Take 5 mLs by mouth at bedtime as needed.   No facility-administered medications prior to visit.    Review of Systems  Constitutional: Negative.   Cardiovascular: Negative.   Gastrointestinal: Positive for abdominal pain.  Hematological: Negative.     {Heme  Chem  Endocrine  Serology  Results Review (optional):23779::" "}  Objective    There were no vitals taken for this visit. {Show previous vital signs (optional):23777::" "}  Physical Exam  ***  No results found for any visits on 04/23/20.  Assessment & Plan      ***  No follow-ups on file.      {provider attestation***:1}   Maryella Shivers  [facility] [phone] (phone) [phone] (fax)  [facility]

## 2020-04-23 ENCOUNTER — Ambulatory Visit: Payer: Self-pay | Admitting: Physician Assistant

## 2020-04-23 ENCOUNTER — Telehealth (INDEPENDENT_AMBULATORY_CARE_PROVIDER_SITE_OTHER): Payer: Self-pay | Admitting: Physician Assistant

## 2020-04-23 DIAGNOSIS — R1084 Generalized abdominal pain: Secondary | ICD-10-CM

## 2020-04-23 NOTE — Progress Notes (Signed)
MyChart Video Visit    Virtual Visit via Video Note   This visit type was conducted due to national recommendations for restrictions regarding the COVID-19 Pandemic (e.g. social distancing) in an effort to limit this patient's exposure and mitigate transmission in our community. This patient is at least at moderate risk for complications without adequate follow up. This format is felt to be most appropriate for this patient at this time. Physical exam was limited by quality of the video and audio technology used for the visit.   Patient location: Home Provider location: Office    I discussed the limitations of evaluation and management by telemedicine and the availability of in person appointments. The patient expressed understanding and agreed to proceed.    Patient: Erika Solomon   DOB: 06-10-1996   23 y.o. Female  MRN: [id number] Visit Date: 04/23/2020  Today's healthcare provider: Trey Sailors, PA-C   Chief Complaint  Patient presents with  . Abdominal Pain  . Emesis   Subjective    Abdominal Pain This is a recurrent problem. The current episode started more than 1 month ago. The problem occurs constantly. The problem has been unchanged.    Patient was seen for this virtually on 04/10/2020. She was advised to go to the ER if worsening. Patient reports she has not done that because she is unable to get childcare. Reports constant abdominal pain that started in the center of her abdomen and has radiated out. Reports frequent vomiting, headaches. Reports diarrhea. Denies fevers. Denies blood in stool.     Medications: Outpatient Medications Prior to Visit  Medication Sig  . azithromycin (ZITHROMAX) 250 MG tablet Take 2 pills on day 1, then 1 pill on each of the following 4 days.  Marland Kitchen dicyclomine (BENTYL) 10 MG capsule Take 1 capsule (10 mg total) by mouth 4 (four) times daily -  before meals and at bedtime.  . Eluxadoline (VIBERZI) 100 MG TABS Take 1 tablet (100 mg  total) by mouth 2 (two) times daily.  Marland Kitchen ibuprofen (ADVIL) 600 MG tablet Take 1 tablet (600 mg total) by mouth every 8 (eight) hours as needed.  . levalbuterol (XOPENEX HFA) 45 MCG/ACT inhaler Inhale 2 puffs into the lungs every 6 (six) hours as needed for wheezing.  . ondansetron (ZOFRAN) 4 MG tablet Take 1 tablet (4 mg total) by mouth every 6 (six) hours as needed for nausea or vomiting.  . predniSONE (DELTASONE) 10 MG tablet Take 6 pills on day 1, 5 pills on day 2 and so on until complete.  . promethazine (PHENERGAN) 12.5 MG tablet Take 1 tablet (12.5 mg total) by mouth every 8 (eight) hours as needed for nausea or vomiting.  . promethazine-dextromethorphan (PROMETHAZINE-DM) 6.25-15 MG/5ML syrup Take 5 mLs by mouth at bedtime as needed.   No facility-administered medications prior to visit.    Review of Systems  Gastrointestinal: Positive for abdominal pain.      Objective    There were no vitals taken for this visit.   Physical Exam Constitutional:      Appearance: She is well-developed.  Pulmonary:     Effort: Pulmonary effort is normal. No respiratory distress.  Neurological:     Mental Status: She is alert.  Psychiatric:        Mood and Affect: Mood normal.        Behavior: Behavior normal.        Assessment & Plan    1. Generalized abdominal pain  My advice  is to still be evaluated emergently in the ER. Patient expresses understanding. Requests additional phenergan which I have filled. I have offered labs and pursuing outpatient CT however this would not be as streamlined and patient is self pay status. She reports she will go to the ER.     No follow-ups on file.     I discussed the assessment and treatment plan with the patient. The patient was provided an opportunity to ask questions and all were answered. The patient agreed with the plan and demonstrated an understanding of the instructions.   The patient was advised to call back or seek an in-person  evaluation if the symptoms worsen or if the condition fails to improve as anticipated.   ITrey Sailors, PA-C, have reviewed all documentation for this visit. The documentation on 04/24/20 for the exam, diagnosis, procedures, and orders are all accurate and complete.   Maryella Shivers [facility] [phone] (phone) [phone] (fax)  [facility]

## 2020-06-11 ENCOUNTER — Encounter: Payer: Self-pay | Admitting: Physician Assistant

## 2020-06-11 ENCOUNTER — Other Ambulatory Visit: Payer: Self-pay

## 2020-06-16 ENCOUNTER — Other Ambulatory Visit: Payer: Self-pay

## 2020-06-16 DIAGNOSIS — Z20822 Contact with and (suspected) exposure to covid-19: Secondary | ICD-10-CM

## 2020-06-17 LAB — SARS-COV-2, NAA 2 DAY TAT

## 2020-06-17 LAB — NOVEL CORONAVIRUS, NAA: SARS-CoV-2, NAA: NOT DETECTED

## 2020-07-08 ENCOUNTER — Telehealth: Payer: Self-pay | Admitting: Emergency Medicine

## 2020-07-08 ENCOUNTER — Ambulatory Visit
Admission: RE | Admit: 2020-07-08 | Discharge: 2020-07-08 | Disposition: A | Discharge status: Home / Self Care | Payer: Self-pay | Source: Ambulatory Visit | Attending: Emergency Medicine | Admitting: Emergency Medicine

## 2020-07-08 VITALS — BP 158/97 | HR 83 | Temp 98.9°F | Resp 18

## 2020-07-08 DIAGNOSIS — R112 Nausea with vomiting, unspecified: Secondary | ICD-10-CM

## 2020-07-08 DIAGNOSIS — R509 Fever, unspecified: Secondary | ICD-10-CM

## 2020-07-08 DIAGNOSIS — R519 Headache, unspecified: Secondary | ICD-10-CM

## 2020-07-08 MED ORDER — ONDANSETRON 4 MG PO TBDP
4.0000 mg | ORAL_TABLET | Freq: Once | ORAL | Status: AC
  Administered 2020-07-08: 4 mg via ORAL

## 2020-07-08 MED ORDER — ONDANSETRON HCL 4 MG PO TABS
4.0000 mg | ORAL_TABLET | Freq: Four times a day (QID) | ORAL | 0 refills | Status: DC
Start: 2020-07-08 — End: 2021-07-22

## 2020-07-08 NOTE — Discharge Instructions (Signed)
Take the antinausea medication as directed.    Keep yourself hydrated with clear liquids, such as water, Gatorade, Pedialyte, Sprite, or ginger ale.    Go to the emergency department if you have acute worsening symptoms.    Follow up with your primary care provider if your symptoms are not improving.      

## 2020-07-08 NOTE — Progress Notes (Signed)
Based on what you shared with me, I feel your condition warrants further evaluation and I recommend that you be seen for a face to face office visit.   NOTE: If you entered your credit card information for this eVisit, you will not be charged. You may see a "hold" on your card for the $35 but that hold will drop off and you will not have a charge processed.   If you are having a true medical emergency please call 911.      For an urgent face to face visit, [facility] has five urgent care centers for your convenience:      NEW:  [facility] [place] Get Driving Directions [phone] [address] [place], [place] [address] . 10 am - 6pm Monday - Friday    [facility] ([place]) Get Driving Directions [phone] [address] [place], [place] [address] . 10 am to 8 pm Monday-Friday . 12 pm to 8 pm Saturday-Sunday     [facility] Get Driving Directions [phone] [address], [address] [place], [place] [address] . 8 am to 8 pm Monday-Friday . 9 am to 6 pm Saturday . 11 am to 6 pm Sunday     [facility] [place] Get Driving Directions  [phone] [address] [place], [place] [address] . 8 am to 8 pm Monday-Friday . 8 am to 4 pm Saturday-Sunday   [facility] [place] Get Driving Directions [phone] [address] [place], [place] [address] . 12 pm to 6 pm Monday-Friday      Your e-visit answers were reviewed by a board certified advanced clinical practitioner to complete your personal care plan.  Thank you for using e-Visits.    **Please do not respond to this message unless you have follow up questions.** Greater than 5 but less than 10 minutes spent researching, coordinating, and implementing care for this patient today  

## 2020-07-08 NOTE — ED Provider Notes (Signed)
Erika Solomon    CSN: [id number] Arrival date & time: 07/08/20  1308      History   Chief Complaint Chief Complaint  Patient presents with  . Emesis  . Headache    HPI Erika Solomon is a 24 y.o. female.   Patient presents with headache, nausea, vomiting x3 days.  She reports 5-6 episodes of emesis today.  She denies fever, chills, rash, cough, shortness of breath, or other symptoms.  No treatments attempted at home.  She had an ED visit today; diagnosed with fever, nausea and vomiting; was instructed to have a face-to-face visit.  Her history is significant for anxiety, depression, panic disorder, anemia.  The history is provided by the patient.    Past Medical History:  Diagnosis Date  . Anemia   . Anxiety and depression   . Blood transfusion without reported diagnosis   . Hx of preeclampsia, prior pregnancy, currently pregnant   . Macrosomia   . Panic disorder   . Shoulder dystocia, delivered     Patient Active Problem List   Diagnosis Date Noted  . Iron deficiency anemia 12/26/2016  . Postpartum care following cesarean delivery 12/26/2016  . Maternal varicella, non-immune 10/05/2016  . Anemia of pregnancy in second trimester 10/05/2016  . UTI in pregnancy, antepartum 05/24/2016    Past Surgical History:  Procedure Laterality Date  . CESAREAN SECTION N/A 12/23/2016   Procedure: CESAREAN SECTION;  Surgeon: Vena Austria, MD;  Location: [facility] ORS;  Service: Obstetrics;  Laterality: N/A;  . No past surgery      OB History    Gravida  4   Para  2   Term  2   Preterm  0   AB  0   Living  2     SAB  0   TAB  0   Ectopic  0   Multiple  0   Live Births  2        Obstetric Comments  Induced at 39 weeks in second pregnancy for pre-eclampsia.          Home Medications    Prior to Admission medications   Medication Sig Start Date End Date Taking? Authorizing Provider  azithromycin (ZITHROMAX) 250 MG tablet Take 2 pills on day 1,  then 1 pill on each of the following 4 days. 10/10/19   Trey Sailors, PA-C  dicyclomine (BENTYL) 10 MG capsule Take 1 capsule (10 mg total) by mouth 4 (four) times daily -  before meals and at bedtime. 06/29/19 09/27/19  Trey Sailors, PA-C  Eluxadoline (VIBERZI) 100 MG TABS Take 1 tablet (100 mg total) by mouth 2 (two) times daily. 06/29/19   Trey Sailors, PA-C  ibuprofen (ADVIL) 600 MG tablet Take 1 tablet (600 mg total) by mouth every 8 (eight) hours as needed. 05/02/19   Joni Reining, PA-C  levalbuterol [facility] HFA) 45 MCG/ACT inhaler Inhale 2 puffs into the lungs every 6 (six) hours as needed for wheezing. 09/19/19   Trey Sailors, PA-C  ondansetron (ZOFRAN) 4 MG tablet Take 1 tablet (4 mg total) by mouth every 6 (six) hours. 07/08/20   Mickie Bail, NP  predniSONE (DELTASONE) 10 MG tablet Take 6 pills on day 1, 5 pills on day 2 and so on until complete. 10/10/19   Trey Sailors, PA-C  promethazine (PHENERGAN) 12.5 MG tablet Take 1 tablet (12.5 mg total) by mouth every 8 (eight) hours as needed for nausea or vomiting.  04/10/20   Trey Sailors, PA-C  promethazine-dextromethorphan (PROMETHAZINE-DM) 6.25-15 MG/5ML syrup Take 5 mLs by mouth at bedtime as needed. 10/10/19   Trey Sailors, PA-C    Family History Family History  Problem Relation Age of Onset  . Diabetes Maternal Grandfather   . Hypertension Father   . Hypertension Maternal Grandmother   . Thyroid disease Mother     Social History Social History   Tobacco Use  . Smoking status: Never Smoker  . Smokeless tobacco: Never Used  Vaping Use  . Vaping Use: Never used  Substance Use Topics  . Alcohol use: No    Alcohol/week: 0.0 standard drinks  . Drug use: No     Allergies   Albuterol, Benadryl [diphenhydramine], Buspar [buspirone], and Strawberry extract   Review of Systems Review of Systems  Constitutional: Negative for chills and fever.  HENT: Negative for ear pain and sore throat.   Eyes:  Negative for pain and visual disturbance.  Respiratory: Negative for cough and shortness of breath.   Cardiovascular: Negative for chest pain and palpitations.  Gastrointestinal: Positive for nausea and vomiting. Negative for abdominal pain.  Genitourinary: Negative for dysuria and hematuria.  Musculoskeletal: Negative for arthralgias and back pain.  Skin: Negative for color change and rash.  Neurological: Positive for headaches. Negative for dizziness, seizures, syncope, facial asymmetry, speech difficulty, weakness and numbness.  All other systems reviewed and are negative.    Physical Exam Triage Vital Signs ED Triage Vitals  Enc Vitals Group     BP 07/08/20 1358 (!) 158/97     Pulse Rate 07/08/20 1358 83     Resp 07/08/20 1358 18     Temp 07/08/20 1358 98.9 F (37.2 C)     Temp src --      SpO2 07/08/20 1358 98 %     Weight --      Height --      Head Circumference --      Peak Flow --      Pain Score 07/08/20 1355 10     Pain Loc --      Pain Edu? --      Excl. in GC? --    No data found.  Updated Vital Signs BP (!) 158/97   Pulse 83   Temp 98.9 F (37.2 C)   Resp 18   LMP 06/30/2020 (Approximate)   SpO2 98%   Visual Acuity Right Eye Distance:   Left Eye Distance:   Bilateral Distance:    Right Eye Near:   Left Eye Near:    Bilateral Near:     Physical Exam Vitals and nursing note reviewed.  Constitutional:      General: She is not in acute distress.    Appearance: She is well-developed. She is obese. She is not ill-appearing.  HENT:     Head: Normocephalic and atraumatic.     Right Ear: Tympanic membrane normal.     Left Ear: Tympanic membrane normal.     Nose: Nose normal.     Mouth/Throat:     Mouth: Mucous membranes are dry.     Pharynx: Oropharynx is clear.  Eyes:     Conjunctiva/sclera: Conjunctivae normal.  Cardiovascular:     Rate and Rhythm: Normal rate and regular rhythm.     Heart sounds: No murmur heard.   Pulmonary:     Effort:  Pulmonary effort is normal. No respiratory distress.     Breath sounds: Normal breath sounds.  Abdominal:  General: Bowel sounds are normal.     Palpations: Abdomen is soft.     Tenderness: There is no abdominal tenderness. There is no right CVA tenderness, guarding or rebound.  Musculoskeletal:     Cervical back: Neck supple.  Skin:    General: Skin is warm and dry.     Findings: No rash.  Neurological:     General: No focal deficit present.     Mental Status: She is alert and oriented to person, place, and time.     Sensory: No sensory deficit.     Motor: No weakness.     Coordination: Coordination normal.     Gait: Gait normal.  Psychiatric:        Mood and Affect: Mood normal.        Behavior: Behavior normal.      UC Treatments / Results  Labs (all labs ordered are listed, but only abnormal results are displayed) Labs Reviewed  NOVEL CORONAVIRUS, NAA    EKG   Radiology No results found.  Procedures Procedures (including critical care time)  Medications Ordered in UC Medications  ondansetron (ZOFRAN-ODT) disintegrating tablet 4 mg (4 mg Oral Given 07/08/20 1420)    Initial Impression / Assessment and Plan / UC Course  I have reviewed the triage vital signs and the nursing notes.  Pertinent labs & imaging results that were available during my care of the patient were reviewed by me and considered in my medical decision making (see chart for details).   Nausea and non-intractable vomiting.  Acute headache.  Given Zofran here and patient able to drink 8 ounces of ginger ale without emesis.  Discharging with prescription for Zofran and instructions to stay hydrated with clear liquids.  Instructed her to go to the ED if she has acute worsening symptoms.  PCR COVID pending.  Instructed patient to self quarantine until her test result is back.  Patient agrees to plan of care.   Final Clinical Impressions(s) / UC Diagnoses   Final diagnoses:  Non-intractable  vomiting with nausea, unspecified vomiting type  Acute nonintractable headache, unspecified headache type     Discharge Instructions     Take the antinausea medication as directed.    Keep yourself hydrated with clear liquids, such as water, Gatorade, Pedialyte, Sprite, or ginger ale.    Go to the emergency department if you have acute worsening symptoms.    Follow up with your primary care provider if your symptoms are not improving.         ED Prescriptions    Medication Sig Dispense Auth. Provider   ondansetron (ZOFRAN) 4 MG tablet Take 1 tablet (4 mg total) by mouth every 6 (six) hours. 12 tablet Mickie Bail, NP     PDMP not reviewed this encounter.   Mickie Bail, NP 07/08/20 [phone]

## 2020-07-08 NOTE — ED Triage Notes (Signed)
Patient complains of nausea/vomiting x3 days with 10/10 headache. Patient reports "it feels like my head is going to explode." agreeable to PCR covid testing.

## 2020-07-10 LAB — SARS-COV-2, NAA 2 DAY TAT

## 2020-07-10 LAB — NOVEL CORONAVIRUS, NAA: SARS-CoV-2, NAA: NOT DETECTED

## 2020-07-16 ENCOUNTER — Telehealth: Payer: Self-pay | Admitting: *Deleted

## 2020-07-16 ENCOUNTER — Telehealth (INDEPENDENT_AMBULATORY_CARE_PROVIDER_SITE_OTHER): Payer: Self-pay | Admitting: Physician Assistant

## 2020-07-16 ENCOUNTER — Telehealth: Payer: Self-pay | Admitting: Obstetrics & Gynecology

## 2020-07-16 ENCOUNTER — Encounter: Payer: Self-pay | Admitting: Physician Assistant

## 2020-07-16 DIAGNOSIS — R059 Cough, unspecified: Secondary | ICD-10-CM

## 2020-07-16 DIAGNOSIS — I10 Essential (primary) hypertension: Secondary | ICD-10-CM

## 2020-07-16 DIAGNOSIS — Z975 Presence of (intrauterine) contraceptive device: Secondary | ICD-10-CM

## 2020-07-16 DIAGNOSIS — F32A Depression, unspecified: Secondary | ICD-10-CM

## 2020-07-16 MED ORDER — AMLODIPINE BESYLATE 5 MG PO TABS: 5.0000 mg | ORAL_TABLET | Freq: Every day | ORAL | 0 refills | Status: DC

## 2020-07-16 MED ORDER — PROMETHAZINE-DM 6.25-15 MG/5ML PO SYRP: 5.0000 mL | ORAL_SOLUTION | Freq: Every evening | ORAL | 0 refills | Status: DC | PRN

## 2020-07-16 NOTE — Telephone Encounter (Signed)
[facility] referring Nexplanon removal and PAP smear. Called and left voicemail for patient to call back to be scheduled. Patient is showing self pay 360.00 New patient annual and nexplanon removal. Pap smear will be billed

## 2020-07-16 NOTE — Progress Notes (Signed)
MyChart Video Visit    Virtual Visit via Video Note   This visit type was conducted due to national recommendations for restrictions regarding the COVID-19 Pandemic (e.g. social distancing) in an effort to limit this patient's exposure and mitigate transmission in our community. This patient is at least at moderate risk for complications without adequate follow up. This format is felt to be most appropriate for this patient at this time. Physical exam was limited by quality of the video and audio technology used for the visit.   Patient location: Home Provider location: Office   I discussed the limitations of evaluation and management by telemedicine and the availability of in person appointments. The patient expressed understanding and agreed to proceed.  Patient: Erika Solomon   DOB: 1996/05/01   24 y.o. Female  MRN: [id number] Visit Date: 07/16/2020  Today's healthcare provider: Trey Sailors, PA-C   Chief Complaint  Patient presents with  . Dizziness   Subjective    HPI  Patient reports that she has had nausea, headache, and feeling lightheaded for the last 2 weeks. She reports a week ago she had the stomach bug which she recovered from. She reports that she developed a fever about 3 days ago of 101. She reports swollen lymph nodes in her neck. Yesterday's blood pressure was 160/110 and the day before 148/108. Has been taking Nyquil for the past few days. Reports throat is slightly sore. No recent vaccinations. She reports her children were sick with a stomach virus. Her other child came home from school with a 103F fever on Monday 07/14/2020.   She reports her blood pressure has been high since 06/17/2020 during her employment physical. She was feeling well at that point. She reports her father and grandmother have HTN. She reports her brother has also recently been diagnosed with HTN in his twenties. She reports her second pregnancy was induced due to HTN.   BP Readings  from Last 7 Encounters:  07/08/20 (!) 158/97  02/22/20 129/83  06/04/19 119/81  05/11/19 134/85  05/02/19 134/74  06/23/18 120/70  06/21/18 132/68   She has a nexplanon that is 1.26 years old and would like it removed. She is also due for a PAP smear. She was previously seen by [facility] in 2018.   She reports previously seeing a counselor and would like to re-establish with one.     Medications: Outpatient Medications Prior to Visit  Medication Sig  . levalbuterol (XOPENEX HFA) 45 MCG/ACT inhaler Inhale 2 puffs into the lungs every 6 (six) hours as needed for wheezing.  . ondansetron (ZOFRAN) 4 MG tablet Take 1 tablet (4 mg total) by mouth every 6 (six) hours.  . promethazine (PHENERGAN) 12.5 MG tablet Take 1 tablet (12.5 mg total) by mouth every 8 (eight) hours as needed for nausea or vomiting.  . [DISCONTINUED] azithromycin (ZITHROMAX) 250 MG tablet Take 2 pills on day 1, then 1 pill on each of the following 4 days.  . [DISCONTINUED] dicyclomine (BENTYL) 10 MG capsule Take 1 capsule (10 mg total) by mouth 4 (four) times daily -  before meals and at bedtime.  . [DISCONTINUED] Eluxadoline (VIBERZI) 100 MG TABS Take 1 tablet (100 mg total) by mouth 2 (two) times daily.  . [DISCONTINUED] ibuprofen (ADVIL) 600 MG tablet Take 1 tablet (600 mg total) by mouth every 8 (eight) hours as needed.  . [DISCONTINUED] predniSONE (DELTASONE) 10 MG tablet Take 6 pills on day 1, 5 pills on day 2 and  so on until complete.  . [DISCONTINUED] promethazine-dextromethorphan (PROMETHAZINE-DM) 6.25-15 MG/5ML syrup Take 5 mLs by mouth at bedtime as needed.   No facility-administered medications prior to visit.    Review of Systems  Constitutional: Positive for activity change, chills, fatigue and fever.  HENT: Positive for sore throat.   Gastrointestinal: Positive for abdominal pain and nausea.  Neurological: Positive for dizziness and headaches.      Objective    LMP 06/30/2020 (Approximate)     Physical Exam Constitutional:      Appearance: Normal appearance.  Pulmonary:     Effort: Pulmonary effort is normal. No respiratory distress.  Neurological:     Mental Status: She is alert.  Psychiatric:        Mood and Affect: Mood normal.        Behavior: Behavior normal.        Assessment & Plan     1. Cough  Likely viral syndrome from her son. Treat symptomatically. Advised on COVID testing.   - promethazine-dextromethorphan (PROMETHAZINE-DM) 6.25-15 MG/5ML syrup; Take 5 mLs by mouth at bedtime as needed.  Dispense: 118 mL; Refill: 0  2. Hypertension, unspecified type  Will start as below. She will call for follow up once she establishes insurance.   - amLODipine (NORVASC) 5 MG tablet; Take 1 tablet (5 mg total) by mouth daily.  Dispense: 90 tablet; Refill: 0  3. Nexplanon in place  Remove nexplanon and get PAP.  - Ambulatory referral to Obstetrics / Gynecology  4. Depression, unspecified depression type  Referral for counseling placed.  - Ambulatory referral to Chronic Care Management Services   Return in about 4 weeks (around 08/13/2020) for htn.     I discussed the assessment and treatment plan with the patient. The patient was provided an opportunity to ask questions and all were answered. The patient agreed with the plan and demonstrated an understanding of the instructions.   The patient was advised to call back or seek an in-person evaluation if the symptoms worsen or if the condition fails to improve as anticipated.   ITrey Sailors, PA-C, have reviewed all documentation for this visit. The documentation on 07/16/20 for the exam, diagnosis, procedures, and orders are all accurate and complete.  The entirety of the information documented in the History of Present Illness, Review of Systems and Physical Exam were personally obtained by me. Portions of this information were initially documented by Anson [place], CMA and reviewed by me for  thoroughness and accuracy.    Maryella Shivers [facility] [phone] (phone) [phone] (fax)  [facility]

## 2020-07-16 NOTE — Chronic Care Management (AMB) (Signed)
  Care Management   Note  07/16/2020 Name: Erika Solomon MRN: [id number] DOB: November 25, 1995  Erika Solomon is a 24 y.o. year old female who is a primary care patient of Trey Sailors, [place]. I reached out to Alois Cliche by phone today in response to a referral sent by Ms. Cecil Cranker [employer]ing's health plan.    Erika Solomon was given information about care management services today including:  1. Care management services include personalized support from designated clinical staff supervised by her physician, including individualized plan of care and coordination with other care providers 2. 24/7 contact phone numbers for assistance for urgent and routine care needs. 3. The patient may stop care management services at any time by phone call to the office staff.  Patient agreed to services and verbal consent obtained.   Follow up plan: Telephone appointment with care management team member scheduled for: 07/29/2020  [facility] Guide, Embedded Care Coordination [facility] Management

## 2020-07-17 NOTE — Telephone Encounter (Signed)
Called and left voicemail for patient to call back to be scheduled. 

## 2020-07-18 NOTE — Telephone Encounter (Signed)
Called and left voicemail for patient to call back to be scheduled. 

## 2020-07-29 ENCOUNTER — Telehealth: Payer: Self-pay

## 2020-07-29 ENCOUNTER — Ambulatory Visit: Payer: Self-pay | Admitting: *Deleted

## 2020-07-29 DIAGNOSIS — F32A Depression, unspecified: Secondary | ICD-10-CM

## 2020-07-30 ENCOUNTER — Telehealth: Payer: Self-pay | Admitting: Nurse Practitioner

## 2020-07-30 DIAGNOSIS — R059 Cough, unspecified: Secondary | ICD-10-CM

## 2020-07-30 DIAGNOSIS — Z20822 Contact with and (suspected) exposure to covid-19: Secondary | ICD-10-CM

## 2020-07-30 MED ORDER — BENZONATATE 100 MG PO CAPS: 100.0000 mg | ORAL_CAPSULE | Freq: Three times a day (TID) | ORAL | 0 refills | Status: DC | PRN
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
## 2020-07-30 NOTE — Progress Notes (Signed)
E-Visit for Corona Virus Screening  Your current symptoms could be consistent with the coronavirus.  Many health care providers can now test patients at their office but not all are.  [facility] has multiple testing sites. For information on our COVID testing locations and hours go to [email or web address]    Testing Information: The COVID-19 Community Testing sites are testing BY APPOINTMENT ONLY.  You can schedule online at [email or web address]  If you do not have access to a smart phone or computer you may call [phone] for an appointment.   Additional testing sites in the Community:  . For [facility] Testing sites in [place]  https://www.cvs.com/minuteclinic/covid-19-testing  . For Pop-up testing sites in [place]  https://covid19.ncdhhs.gov/about-covid-19/testing/find-my-testing-place/pop-testing-sites  . For [facility] https://[place].gov[place]/human-services/health-department/coronavirus-covid-19-info/covid-19-testing  . For [place] testing in [place] https://www[place].gov/[place]/human-services/health-department/coronavirus-covid-19-info/covid-19-testing  . For [facility] testing in [place]   https://[facility].care/covidtesting  For  more information about community testing call [phone]   Please quarantine yourself while awaiting your test results. Please stay home for a minimum of 10 days from the first day of illness with improving symptoms and you have had 24 hours of no fever (without the use of Tylenol (Acetaminophen) Motrin (Ibuprofen) or any fever reducing medication).  Also - Do not get tested prior to returning to work because once you have had a positive test the test can stay positive for more than a month in some cases.   You should wear a mask or cloth face covering over your nose and mouth if you must be around other people or animals, including pets (even at home). Try  to stay at least 6 feet away from other people. This will protect the people around you.  Please continue good preventive care measures, including:  frequent hand-washing, avoid touching your face, cover coughs/sneezes, stay out of crowds and keep a 6 foot distance from others.  COVID-19 is a respiratory illness with symptoms that are similar to the flu. Symptoms are typically mild to moderate, but there have been cases of severe illness and death due to the virus.   The following symptoms may appear 2-14 days after exposure: . Fever . Cough . Shortness of breath or difficulty breathing . Chills . Repeated shaking with chills . Muscle pain . Headache . Sore throat . New loss of taste or smell . Fatigue . Congestion or runny nose . Nausea or vomiting . Diarrhea  Go to the nearest hospital ED for assessment if fever/cough/breathlessness are severe or illness seems like a threat to life.  It is vitally important that if you feel that you have an infection such as this virus or any other virus that you stay home and away from places where you may spread it to others.  You should avoid contact with people age 65 and older.   You can use medication such as A prescription cough medication called Tessalon Perles 100 mg. You may take 1-2 capsules every 8 hours as needed for cough  You may also take acetaminophen (Tylenol) as needed for fever.  Reduce your risk of any infection by using the same precautions used for avoiding the common cold or flu:  . Wash your hands often with soap and warm water for at least 20 seconds.  If soap and water are not readily available, use an alcohol-based hand sanitizer with at least 60% alcohol.  . If coughing or sneezing, cover your mouth and nose by coughing or sneezing into the elbow areas of   your shirt or coat, into a tissue or into your sleeve (not your hands). . Avoid shaking hands with others and consider head nods or verbal greetings only. . Avoid touching  your eyes, nose, or mouth with unwashed hands.  . Avoid close contact with people who are sick. . Avoid places or events with large numbers of people in one location, like concerts or sporting events. . Carefully consider travel plans you have or are making. . If you are planning any travel outside or inside the [place], visit the CDC's Travelers' Health webpage for the latest health notices. . If you have some symptoms but not all symptoms, continue to monitor at home and seek medical attention if your symptoms worsen. . If you are having a medical emergency, call 911.  HOME CARE . Only take medications as instructed by your medical team. . Drink plenty of fluids and get plenty of rest. . A steam or ultrasonic humidifier can help if you have congestion.   GET HELP RIGHT AWAY IF YOU HAVE EMERGENCY WARNING SIGNS** FOR COVID-19. If you or someone is showing any of these signs seek emergency medical care immediately. Call 911 or proceed to your closest emergency facility if: . You develop worsening high fever. . Trouble breathing . Bluish lips or face . Persistent pain or pressure in the chest . New confusion . Inability to wake or stay awake . You cough up blood. . Your symptoms become more severe  **This list is not all possible symptoms. Contact your medical provider for any symptoms that are sever or concerning to you.  MAKE SURE YOU   Understand these instructions.  Will watch your condition.  Will get help right away if you are not doing well or get worse.  Your e-visit answers were reviewed by a board certified advanced clinical practitioner to complete your personal care plan.  Depending on the condition, your plan could have included both over the counter or prescription medications.  If there is a problem please reply once you have received a response from your provider.  Your safety is important to us.  If you have drug allergies check your prescription carefully.    You can  use MyChart to ask questions about today's visit, request a non-urgent call back, or ask for a work or school excuse for 24 hours related to this e-Visit. If it has been greater than 24 hours you will need to follow up with your provider, or enter a new e-Visit to address those concerns. You will get an e-mail in the next two days asking about your experience.  I hope that your e-visit has been valuable and will speed your recovery. Thank you for using e-visits.  5-10 minutes spent reviewing and documenting in chart.   
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
## 2020-08-01 ENCOUNTER — Ambulatory Visit: Payer: Self-pay | Admitting: *Deleted

## 2020-08-01 NOTE — Chronic Care Management (AMB) (Signed)
   Care Management   Social Work Note  08/01/2020 Name: Erika Solomon MRN: [id number] DOB: 1995/10/22  Erika Solomon is a 24 y.o. year old female who sees Erika Solomon, [place] for primary care. The CCM team was consulted for assistance with Mental Health Counseling and Resources.   Phone call to the [facility] to discuss referral and treatment using [employer] funding as patient does not have insurance. Message left for a return call. [employer] also contacted. They are not taking referrals for counseling at this time, only medication management.  SDOH (Social Determinants of Health) assessments performed: No     Outpatient Encounter Medications as of 08/01/2020  Medication Sig  . amLODipine (NORVASC) 5 MG tablet Take 1 tablet (5 mg total) by mouth daily.  . benzonatate (TESSALON PERLES) 100 MG capsule Take 1 capsule (100 mg total) by mouth 3 (three) times daily as needed.  . levalbuterol (XOPENEX HFA) 45 MCG/ACT inhaler Inhale 2 puffs into the lungs every 6 (six) hours as needed for wheezing.  . ondansetron (ZOFRAN) 4 MG tablet Take 1 tablet (4 mg total) by mouth every 6 (six) hours.  . promethazine (PHENERGAN) 12.5 MG tablet Take 1 tablet (12.5 mg total) by mouth every 8 (eight) hours as needed for nausea or vomiting.  . promethazine-dextromethorphan (PROMETHAZINE-DM) 6.25-15 MG/5ML syrup Take 5 mLs by mouth at bedtime as needed.   No facility-administered encounter medications on file as of 08/01/2020.    Goals Addressed   None     Follow Up Plan: This Child psychotherapist will follow up with the [facility] regarding mental health follow up  [employer], LCSW [facility] [phone]

## 2020-08-04 ENCOUNTER — Ambulatory Visit: Payer: Self-pay | Admitting: *Deleted

## 2020-08-04 DIAGNOSIS — F32A Depression, unspecified: Secondary | ICD-10-CM

## 2020-08-04 NOTE — Chronic Care Management (AMB) (Signed)
  Chronic Care Management   Social Work Note  08/04/2020 Name: Erika Solomon MRN: [id number] DOB: 27-Sep-1996  Erika Solomon is a 24 y.o. year old female who sees Trey Sailors, [place] for primary care. The CCM team was consulted for assistance with Mental Health Counseling and Resources.  Phone call to the [facility] who confirmed that patient would be able to be followed by an intern due to the fact that patient is not insured at this time. This Child psychotherapist will contact patient to confirm her agreement for follow up with an intern and will refer according. This Child psychotherapist also contacted [facility] [facility], however they are not seeing any patient's for counseling at this time, only for medication management.   SDOH (Social Determinants of Health) assessments performed: No     Outpatient Encounter Medications as of 08/04/2020  Medication Sig  . amLODipine (NORVASC) 5 MG tablet Take 1 tablet (5 mg total) by mouth daily.  . benzonatate (TESSALON PERLES) 100 MG capsule Take 1 capsule (100 mg total) by mouth 3 (three) times daily as needed.  . levalbuterol (XOPENEX HFA) 45 MCG/ACT inhaler Inhale 2 puffs into the lungs every 6 (six) hours as needed for wheezing.  . ondansetron (ZOFRAN) 4 MG tablet Take 1 tablet (4 mg total) by mouth every 6 (six) hours.  . promethazine (PHENERGAN) 12.5 MG tablet Take 1 tablet (12.5 mg total) by mouth every 8 (eight) hours as needed for nausea or vomiting.  . promethazine-dextromethorphan (PROMETHAZINE-DM) 6.25-15 MG/5ML syrup Take 5 mLs by mouth at bedtime as needed.   No facility-administered encounter medications on file as of 08/04/2020.    Goals Addressed   None     Follow Up Plan: SW will follow up with patient by phone over the next 7-14 business days   Crystalynn [place]inerney, [place] Clinical Social Worker  [facility] Care Management [phone]

## 2020-08-07 ENCOUNTER — Ambulatory Visit: Payer: Self-pay | Admitting: *Deleted

## 2020-08-07 ENCOUNTER — Telehealth: Payer: Self-pay

## 2020-08-07 DIAGNOSIS — F32A Depression, unspecified: Secondary | ICD-10-CM

## 2020-08-07 NOTE — Patient Instructions (Signed)
Thank you allowing the Chronic Care Management Team to be a part of your care! It was a pleasure speaking with you today!  1. Please call this social worker with any questions or concerns regarding your community resource needs  CCM (Chronic Care Management) Team   Juanell Fairly RN, BSN Nurse Care Coordinator  [phone]  Kalynne Womac [address] LCSW Clinical Social Worker [phone]  Goals Addressed              This Visit's Progress   .  "Things are getting harder to handle" (pt-stated)        CARE PLAN ENTRY (see longitudinal plan of care for additional care plan information)  Current Barriers:  Marland Kitchen Mental Health Concerns   Clinical Social Work Clinical Goal(s):  Marland Kitchen Over the next 90 days, patient will follow up with a therapist for ongoing mental health treatment* as directed by SW  Interventions: . Patient discussed continued symptoms of depression and feeling overwhelmed . Patient agreed to referral to the [facility] for ongoing mental health counseling-referral competed . Discussed difficulties with finances, possibility of submitting to the [facility] will e-mail the invoice . Emotional support and positive reinforcement provided  . Discussed plans with patient for ongoing care management follow up and provided patient with direct contact information for care management team  Patient Self Care Activities:  . Performs ADL's independently . Performs IADL's independently . Knowledge deficit of local mental health agencies  Please see past updates related to this goal by clicking on the "Past Updates" button in the selected goal          The patient verbalized understanding of instructions provided today and declined a print copy of patient instruction materials.   Telephone follow up appointment with care management team member scheduled for:  08/21/20

## 2020-08-07 NOTE — Chronic Care Management (AMB) (Signed)
  Care Management    Clinical Social Work Follow Up Note  08/07/2020 Name: Erika Solomon MRN: [id number] DOB: 08-13-1996  Erika Solomon is a 24 y.o. year old female who is a primary care patient of Trey Sailors, [place]. The CCM team was consulted for assistance with Mental Health Counseling and Resources.   Review of patient status, including review of consultants reports, other relevant assessments, and collaboration with appropriate care team members and the patient's provider was performed as part of comprehensive patient evaluation and provision of chronic care management services.    SDOH (Social Determinants of Health) assessments performed: No    Outpatient Encounter Medications as of 08/07/2020  Medication Sig  . amLODipine (NORVASC) 5 MG tablet Take 1 tablet (5 mg total) by mouth daily.  . benzonatate (TESSALON PERLES) 100 MG capsule Take 1 capsule (100 mg total) by mouth 3 (three) times daily as needed.  . levalbuterol (XOPENEX HFA) 45 MCG/ACT inhaler Inhale 2 puffs into the lungs every 6 (six) hours as needed for wheezing.  . ondansetron (ZOFRAN) 4 MG tablet Take 1 tablet (4 mg total) by mouth every 6 (six) hours.  . promethazine (PHENERGAN) 12.5 MG tablet Take 1 tablet (12.5 mg total) by mouth every 8 (eight) hours as needed for nausea or vomiting.  . promethazine-dextromethorphan (PROMETHAZINE-DM) 6.25-15 MG/5ML syrup Take 5 mLs by mouth at bedtime as needed.   No facility-administered encounter medications on file as of 08/07/2020.     Goals Addressed              This Visit's Progress   .  "Things are getting harder to handle" (pt-stated)        CARE PLAN ENTRY (see longitudinal plan of care for additional care plan information)  Current Barriers:  Marland Kitchen Mental Health Concerns   Clinical Social Work Clinical Goal(s):  Marland Kitchen Over the next 90 days, patient will follow up with a therapist for ongoing mental health treatment* as directed by  SW  Interventions: . Patient discussed continued symptoms of depression and feeling overwhelmed . Patient agreed to referral to the [facility] for ongoing mental health counseling-referral competed . Discussed difficulties with finances, possibility of submitting to the [facility] will e-mail the invoice . Emotional support and positive reinforcement provided  . Discussed plans with patient for ongoing care management follow up and provided patient with direct contact information for care management team  Patient Self Care Activities:  . Performs ADL's independently . Performs IADL's independently . Knowledge deficit of local mental health agencies  Please see past updates related to this goal by clicking on the "Past Updates" button in the selected goal          Follow Up Plan: SW will follow up with patient by phone over the next 7-14 business days    [place], [place] Clinical Social Worker  [facility] Care Management [phone]

## 2020-08-21 ENCOUNTER — Ambulatory Visit: Payer: Self-pay | Admitting: *Deleted

## 2020-08-21 DIAGNOSIS — F32A Depression, unspecified: Secondary | ICD-10-CM

## 2020-08-21 NOTE — Patient Instructions (Addendum)
Thank you allowing the Chronic Care Management Team to be a part of your care! It was a pleasure speaking with you today!  1. Please provide the invoice for your electric bill to be submitted 2. Please contact the [facility] to schedule your initial assessment  CCM (Chronic Care Management) Team   Juanell Fairly RN, BSN Nurse Care Coordinator  [phone]  Beth Spackman [address], LCSW Clinical Social Worker [phone]  Goals Addressed              This Visit's Progress   .  "Things are getting harder to handle" (pt-stated)        CARE PLAN ENTRY (see longitudinal plan of care for additional care plan information)  Current Barriers:  Marland Kitchen Mental Health Concerns   Clinical Social Work Clinical Goal(s):  Marland Kitchen Over the next 90 days, patient will follow up with a therapist for ongoing mental health treatment* as directed by SW  Interventions: . Patient discussed continued symptoms of depression and feeling overwhelmed . Patient states that her children have been having some medical challenges and she had not been able to worl . Discussed difficulties with finances, possibility of submitting to the [facility] Care-patient's e-mailed  invoice not received, e-mail provided again to re-send . Emotional support and positive reinforcement provided  . Recommended taking one day at time, taking small bits of time focusing on self care . Patient states that the [facility] has contacted her, however she needs to cal them back to schedule the initial appointment . Discussed plans with patient for ongoing care management follow up and provided patient with direct contact information for care management team  Patient Self Care Activities:  . Performs ADL's independently . Performs IADL's independently . Knowledge deficit of local mental health agencies  Please see past updates related to this goal by clicking on the "Past Updates" button in the selected goal          The  patient verbalized understanding of instructions, educational materials, and care plan provided today and declined offer to receive copy of patient instructions, educational materials, and care plan.   The care management team will reach out to the patient again over the next 7-14 business days days.

## 2020-08-21 NOTE — Chronic Care Management (AMB) (Signed)
°   Care Management    Clinical Social Work Follow Up Note  08/21/2020 Name: HANYA GUERIN MRN: [id number] DOB: [birth year]  PALOMA GRANGE is a 24 y.o. year old female who is a primary care patient of Trey Sailors, [place]. The CCM team was consulted for assistance with Mental Health Counseling and Resources.   Review of patient status, including review of consultants reports, other relevant assessments, and collaboration with appropriate care team members and the patient's provider was performed as part of comprehensive patient evaluation and provision of chronic care management services.    SDOH (Social Determinants of Health) assessments performed: No    Outpatient Encounter Medications as of 08/21/2020  Medication Sig   amLODipine (NORVASC) 5 MG tablet Take 1 tablet (5 mg total) by mouth daily.   benzonatate (TESSALON PERLES) 100 MG capsule Take 1 capsule (100 mg total) by mouth 3 (three) times daily as needed.   levalbuterol (XOPENEX HFA) 45 MCG/ACT inhaler Inhale 2 puffs into the lungs every 6 (six) hours as needed for wheezing.   ondansetron (ZOFRAN) 4 MG tablet Take 1 tablet (4 mg total) by mouth every 6 (six) hours.   promethazine (PHENERGAN) 12.5 MG tablet Take 1 tablet (12.5 mg total) by mouth every 8 (eight) hours as needed for nausea or vomiting.   promethazine-dextromethorphan (PROMETHAZINE-DM) 6.25-15 MG/5ML syrup Take 5 mLs by mouth at bedtime as needed.   No facility-administered encounter medications on file as of 08/21/2020.     Goals Addressed              This Visit's Progress     "Things are getting harder to handle" (pt-stated)        CARE PLAN ENTRY (see longitudinal plan of care for additional care plan information)  Current Barriers:   Mental Health Concerns   Clinical Social Work Clinical Goal(s):   Over the next 90 days, patient will follow up with a therapist for ongoing mental health treatment* as directed by  SW  Interventions:  Patient discussed continued symptoms of depression and feeling overwhelmed  Patient states that her children have been having some medical challenges and she had not been able to worl  Discussed difficulties with finances, possibility of submitting to the [facility] Care-patient's e-mailed  invoice not received, e-mail provided again to re-send  Emotional support and positive reinforcement provided   Recommended taking one day at time, taking small bits of time focusing on self care  Patient states that the [facility] has contacted her, however she needs to cal them back to schedule the initial appointment  Discussed plans with patient for ongoing care management follow up and provided patient with direct contact information for care management team  Patient Self Care Activities:   Performs ADL's independently  Performs IADL's independently  Knowledge deficit of local mental health agencies  Please see past updates related to this goal by clicking on the "Past Updates" button in the selected goal          Follow Up Plan: SW will follow up with patient by phone over the next 2 weeks    Marcus Groll, LCSW Clinical Social Worker  [facility] Care Management [phone]

## 2020-08-27 ENCOUNTER — Ambulatory Visit: Payer: Self-pay | Admitting: *Deleted

## 2020-08-27 DIAGNOSIS — I10 Essential (primary) hypertension: Secondary | ICD-10-CM

## 2020-08-27 DIAGNOSIS — F32A Depression, unspecified: Secondary | ICD-10-CM

## 2020-08-27 NOTE — Chronic Care Management (AMB) (Signed)
  Care Management    Clinical Social Work Follow Up Note  08/27/2020 Name: Erika Solomon MRN: [id number] DOB: [birth year]  Erika Solomon is a 24 y.o. year old female who is a primary care patient of Trey Sailors, [place]. The CCM team was consulted for assistance with Mental Health Counseling and Resources.   Review of patient status, including review of consultants reports, other relevant assessments, and collaboration with appropriate care team members and the patient's provider was performed as part of comprehensive patient evaluation and provision of chronic care management services.    SDOH (Social Determinants of Health) assessments performed: No    Outpatient Encounter Medications as of 08/27/2020  Medication Sig  . amLODipine (NORVASC) 5 MG tablet Take 1 tablet (5 mg total) by mouth daily.  . benzonatate (TESSALON PERLES) 100 MG capsule Take 1 capsule (100 mg total) by mouth 3 (three) times daily as needed.  . levalbuterol (XOPENEX HFA) 45 MCG/ACT inhaler Inhale 2 puffs into the lungs every 6 (six) hours as needed for wheezing.  . ondansetron (ZOFRAN) 4 MG tablet Take 1 tablet (4 mg total) by mouth every 6 (six) hours.  . promethazine (PHENERGAN) 12.5 MG tablet Take 1 tablet (12.5 mg total) by mouth every 8 (eight) hours as needed for nausea or vomiting.  . promethazine-dextromethorphan (PROMETHAZINE-DM) 6.25-15 MG/5ML syrup Take 5 mLs by mouth at bedtime as needed.   No facility-administered encounter medications on file as of 08/27/2020.     Goals Addressed              This Visit's Progress   .  "Things are getting harder to handle" (pt-stated)        CARE PLAN ENTRY (see longitudinal plan of care for additional care plan information)  Current Barriers:  Marland Kitchen Mental Health Concerns   Clinical Social Work Clinical Goal(s):  Marland Kitchen Over the next 90 days, patient will follow up with a therapist for ongoing mental health treatment* as directed by  SW  Interventions: . Patient informed that her request for [facility] has been approved to cover her [employer]. . Patient confirmed that she has an initial appointment with the  [facility] on 09/03/20.  Marland Kitchen Positive reinforcement provided for effort made for mental health follow up . Discussed plans with patient for ongoing care management follow up and provided patient with direct contact information for care management team  Patient Self Care Activities:  . Performs ADL's independently . Performs IADL's independently . Knowledge deficit of local mental health agencies  Please see past updates related to this goal by clicking on the "Past Updates" button in the selected goal          Follow Up Plan: SW will follow up with patient by phone over the next 7-14 buisness days    Gloria Ricardo, [place] Clinical Social Worker  [facility] Care Management [phone]

## 2020-08-27 NOTE — Patient Instructions (Addendum)
Thank you allowing the Chronic Care Management Team to be a part of your care! It was a pleasure speaking with you today!  1. Please follow up with the [facility] on 09/03/20 for your initial appointment  CCM (Chronic Care Management) Team    Juanell Fairly RN, BSN Nurse Care Coordinator  [phone]  Cletis Muma [address] LCSW Clinical Social Worker [phone]  Goals Addressed              This Visit's Progress   .  "Things are getting harder to handle" (pt-stated)        CARE PLAN ENTRY (see longitudinal plan of care for additional care plan information)  Current Barriers:  Marland Kitchen Mental Health Concerns   Clinical Social Work Clinical Goal(s):  Marland Kitchen Over the next 90 days, patient will follow up with a therapist for ongoing mental health treatment* as directed by SW  Interventions: . Patient informed that her request for [facility] has been approved to cover her [employer]. . Patient confirmed that she has an initial appointment with the  [facility] on 09/03/20.  Marland Kitchen Positive reinforcement provided for effort made for mental health follow up . Discussed plans with patient for ongoing care management follow up and provided patient with direct contact information for care management team  Patient Self Care Activities:  . Performs ADL's independently . Performs IADL's independently . Knowledge deficit of local mental health agencies  Please see past updates related to this goal by clicking on the "Past Updates" button in the selected goal          The patient verbalized understanding of instructions, educational materials, and care plan provided today and declined offer to receive copy of patient instructions, educational materials, and care plan.   Telephone follow up appointment with care management team member scheduled for:09/04/20

## 2020-09-03 ENCOUNTER — Ambulatory Visit: Payer: Self-pay | Admitting: *Deleted

## 2020-09-03 DIAGNOSIS — F32A Depression, unspecified: Secondary | ICD-10-CM

## 2020-09-03 DIAGNOSIS — I10 Essential (primary) hypertension: Secondary | ICD-10-CM

## 2020-09-04 ENCOUNTER — Telehealth: Payer: Self-pay

## 2020-09-04 NOTE — Chronic Care Management (AMB) (Signed)
  Care Management    Clinical Social Work Follow Up Note  09/04/2020 Name: Erika Solomon MRN: [id number] DOB: 02/17/1996  Erika Solomon is a 24 y.o. year old female who is a primary care patient of Trey Sailors, [place]. The CCM team was consulted for assistance with Mental Health Counseling and Resources.   Review of patient status, including review of consultants reports, other relevant assessments, and collaboration with appropriate care team members and the patient's provider was performed as part of comprehensive patient evaluation and provision of chronic care management services.    SDOH (Social Determinants of Health) assessments performed: No    Outpatient Encounter Medications as of 09/03/2020  Medication Sig  . amLODipine (NORVASC) 5 MG tablet Take 1 tablet (5 mg total) by mouth daily.  . benzonatate (TESSALON PERLES) 100 MG capsule Take 1 capsule (100 mg total) by mouth 3 (three) times daily as needed.  . levalbuterol (XOPENEX HFA) 45 MCG/ACT inhaler Inhale 2 puffs into the lungs every 6 (six) hours as needed for wheezing.  . ondansetron (ZOFRAN) 4 MG tablet Take 1 tablet (4 mg total) by mouth every 6 (six) hours.  . promethazine (PHENERGAN) 12.5 MG tablet Take 1 tablet (12.5 mg total) by mouth every 8 (eight) hours as needed for nausea or vomiting.  . promethazine-dextromethorphan (PROMETHAZINE-DM) 6.25-15 MG/5ML syrup Take 5 mLs by mouth at bedtime as needed.   No facility-administered encounter medications on file as of 09/03/2020.     Goals Addressed              This Visit's Progress   .  "Things are getting harder to handle" (pt-stated)        CARE PLAN ENTRY (see longitudinal plan of care for additional care plan information)  Current Barriers:  Marland Kitchen Mental Health Concerns   Clinical Social Work Clinical Goal(s):  Marland Kitchen Over the next 90 days, patient will follow up with a therapist for ongoing mental health treatment* as directed by  SW  Interventions: . Patient informed that her request for [facility] has been approved to cover her [employer]. . Patient confirmed that she has an initial appointment with the  [facility] scheduled for 09/03/20 however it was cancelled due to a provider emergency . Discussed need to inform provider that she is uninsured as well as unemployed for possible use of [place] funding through the county to pay for services rendered . Discussed plans with patient for ongoing care management follow up and provided patient with direct contact information for care management team  Patient Self Care Activities:  . Performs ADL's independently . Performs IADL's independently . Knowledge deficit of local mental health agencies  Please see past updates related to this goal by clicking on the "Past Updates" button in the selected goal          Follow Up Plan: SW will follow up with patient by phone over the next 7-14 business days    [place], [place] Clinical Social Worker  [facility] Care Management [phone]

## 2020-09-04 NOTE — Patient Instructions (Addendum)
Thank you allowing the Chronic Care Management Team to be a part of your care! It was a pleasure speaking with you today!  1. Please call this social worker with any questions or concerns regarding your community resource needs  CCM (Chronic Care Management) Team   Juanell Fairly RN, BSN Nurse Care Coordinator  [phone]  Laquon Emel [address] LCSW Clinical Social Worker [phone]  Goals Addressed              This Visit's Progress   .  "Things are getting harder to handle" (pt-stated)        CARE PLAN ENTRY (see longitudinal plan of care for additional care plan information)  Current Barriers:  Marland Kitchen Mental Health Concerns   Clinical Social Work Clinical Goal(s):  Marland Kitchen Over the next 90 days, patient will follow up with a therapist for ongoing mental health treatment* as directed by SW  Interventions: . Patient informed that her request for [facility] has been approved to cover her [employer]. . Patient confirmed that she has an initial appointment with the  [facility] scheduled for 09/03/20 however it was cancelled due to a provider emergency . Discussed need to inform provider that she is uninsured as well as unemployed for possible use of [place] funding through the county to pay for services rendered . Discussed plans with patient for ongoing care management follow up and provided patient with direct contact information for care management team  Patient Self Care Activities:  . Performs ADL's independently . Performs IADL's independently . Knowledge deficit of local mental health agencies  Please see past updates related to this goal by clicking on the "Past Updates" button in the selected goal          The patient verbalized understanding of instructions, educational materials, and care plan provided today and declined offer to receive copy of patient instructions, educational materials, and care plan.   Telephone follow up appointment with care management  team member scheduled for: 09/18/20

## 2020-09-08 ENCOUNTER — Ambulatory Visit (INDEPENDENT_AMBULATORY_CARE_PROVIDER_SITE_OTHER): Payer: BC Managed Care – PPO | Admitting: Obstetrics and Gynecology

## 2020-09-08 ENCOUNTER — Other Ambulatory Visit: Payer: Self-pay

## 2020-09-08 ENCOUNTER — Encounter: Payer: Self-pay | Admitting: Obstetrics and Gynecology

## 2020-09-08 VITALS — BP 122/80 | Ht 63.5 in | Wt 227.0 lb

## 2020-09-08 DIAGNOSIS — Z30011 Encounter for initial prescription of contraceptive pills: Secondary | ICD-10-CM | POA: Diagnosis not present

## 2020-09-08 DIAGNOSIS — Z3046 Encounter for surveillance of implantable subdermal contraceptive: Secondary | ICD-10-CM | POA: Diagnosis not present

## 2020-09-08 MED ORDER — LO LOESTRIN FE 1 MG-10 MCG / 10 MCG PO TABS: 1.0000 | ORAL_TABLET | Freq: Every day | ORAL | 3 refills | Status: DC
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
## 2020-09-08 NOTE — Progress Notes (Unsigned)
Does not want Pap today. RM 4

## 2020-09-18 ENCOUNTER — Telehealth: Payer: Self-pay | Admitting: *Deleted

## 2020-09-18 ENCOUNTER — Telehealth: Payer: Self-pay

## 2020-09-18 NOTE — Telephone Encounter (Signed)
   Chronic Care Management   Unsuccessful Call Note 09/18/2020 Name: Erika Solomon MRN: [id number] DOB: 1996/06/16  Patient  is a 24 year old female who sees Osvaldo Angst, [place] for primary care. Osvaldo Angst, PA-C asked the CCM team to consult the patient for Mental Health Counseling and Resources.      This social worker was unable to reach patient via telephone today for follow up call regarding mental health and financial assistance provided. I have left HIPAA compliant voicemail asking patient to return my call. (unsuccessful outreach #1).   Plan: Will follow-up within 7-14 business days via telephone.      Verna Czech, LCSW Clinical Social Worker  [facility] Care Management [phone]

## 2020-09-22 DIAGNOSIS — J029 Acute pharyngitis, unspecified: Secondary | ICD-10-CM | POA: Diagnosis not present

## 2020-09-22 DIAGNOSIS — Z20822 Contact with and (suspected) exposure to covid-19: Secondary | ICD-10-CM | POA: Diagnosis not present

## 2020-10-02 ENCOUNTER — Ambulatory Visit: Payer: Self-pay | Admitting: *Deleted

## 2020-10-02 DIAGNOSIS — F32A Depression, unspecified: Secondary | ICD-10-CM

## 2020-10-02 NOTE — Patient Instructions (Signed)
Visit Information  Patient Care Plan: Depression (Adult)    Problem Identified: Symptoms (Depression)     Long-Range Goal: Symptoms Monitored and Managed   Start Date: 07/29/2020  Expected End Date: 01/29/2021  This Visit's Progress: On track  Priority: High  Note:   Current Barriers:  Marland Kitchen Mental Health Concerns   Clinical Social Work Clinical Goal(s):  Marland Kitchen Over the next 90 days, patient will follow up with a therapist for ongoing mental health treatment* as directed by SW  Interventions: . Confirmed from patient that her [employer] bill was paid through the [facility] . Patient confirmed that she has an initial appointment with the  [facility] scheduled for 09/03/20 however it was cancelled due to a provider emergency and has not been re-scheduled . Discussed increased concern today due to job loss, emotional support provided, alternative options in regards to additional employment options explored . Discussed plans with patient for ongoing care management follow up and provided patient with direct contact information for care management team  Patient Self Care Activities:  - begin personal counseling - talk about feelings with a friend, family or spiritual advisor - practice positive thinking and self-talk    Task: Alleviate Barriers to Depression Treatment   Due Date: 01/29/2021  Priority: Routine  Note:   Care Management Activities:    - emotional support provided - participation in mental health treatment encouraged    Notes:      The patient verbalized understanding of instructions, educational materials, and care plan provided today and declined offer to receive copy of patient instructions, educational materials, and care plan.   Telephone follow up appointment with care management team member scheduled for: 10/14/20   Verna Czech, LCSW Clinical Social Worker  [facility] Care Management [phone]

## 2020-10-02 NOTE — Chronic Care Management (AMB) (Signed)
Care Management  Clinical Social Work Note  10/02/2020 Name: Erika Solomon MRN: [id number] DOB: [birth year]  Erika Solomon is a 24 y.o. year old female who is a primary care patient of Trey Sailors, [place]. The CCM team was consulted for assistance with chronic disease management and care coordination needs.   Erika Solomon was given information about Care Management services on 07/16/21  including:  1. Care Management services include personalized support from designated clinical staff supervised by her physician, including individualized plan of care and coordination with other care providers 2. 24/7 contact phone numbers for assistance for urgent and routine care needs. 3. The patient may stop care management services at any time (effective at the end of the month) by phone call to the office staff.  Patient agreed to services and consent obtained.   Engaged with patient by telephone for follow up visit in response to provider referral for social work case management and/or care coordination services.   Assessment/Interventions: Review of patient past medical history, allergies, medications, and health status, including review of pertinent consultant reports was performed as part of comprehensive evaluation and provision of care management/care coordination services.   SDOH (Social Determinants of Health) screening and interventions performed today:   Advanced Directives Status:Not addressed in this encounter.     Care Plan    Allergies  Allergen Reactions  . Albuterol Anaphylaxis  . Benadryl [Diphenhydramine] Anaphylaxis  . Buspar [Buspirone] Anaphylaxis  . Strawberry Extract Anaphylaxis    Outpatient Encounter Medications as of 10/02/2020  Medication Sig  . amLODipine (NORVASC) 5 MG tablet Take 1 tablet (5 mg total) by mouth daily.  . benzonatate (TESSALON PERLES) 100 MG capsule Take 1 capsule (100 mg total) by mouth 3 (three) times daily as needed. (Patient not taking:  Reported on 09/08/2020)  . levalbuterol (XOPENEX HFA) 45 MCG/ACT inhaler Inhale 2 puffs into the lungs every 6 (six) hours as needed for wheezing.  . Norethindrone-Ethinyl Estradiol-Fe Biphas (LO LOESTRIN FE) 1 MG-10 MCG / 10 MCG tablet Take 1 tablet by mouth daily.  . ondansetron (ZOFRAN) 4 MG tablet Take 1 tablet (4 mg total) by mouth every 6 (six) hours. (Patient not taking: Reported on 09/08/2020)  . promethazine (PHENERGAN) 12.5 MG tablet Take 1 tablet (12.5 mg total) by mouth every 8 (eight) hours as needed for nausea or vomiting. (Patient not taking: Reported on 09/08/2020)  . promethazine-dextromethorphan (PROMETHAZINE-DM) 6.25-15 MG/5ML syrup Take 5 mLs by mouth at bedtime as needed. (Patient not taking: Reported on 09/08/2020)   No facility-administered encounter medications on file as of 10/02/2020.    Patient Active Problem List   Diagnosis Date Noted  . Iron deficiency anemia 12/26/2016  . Postpartum care following cesarean delivery 12/26/2016  . Maternal varicella, non-immune 10/05/2016  . Anemia of pregnancy in second trimester 10/05/2016  . UTI in pregnancy, antepartum 05/24/2016    Conditions to be addressed/monitored per PCP order: , Mental Health Counseling and Resources  Patient Care Plan: Depression (Adult)    Problem Identified: Symptoms (Depression)     Long-Range Goal: Symptoms Monitored and Managed   Start Date: 07/29/2020  Expected End Date: 01/29/2021  This Visit's Progress: On track  Priority: High  Note:   Current Barriers:  Marland Kitchen Mental Health Concerns   Clinical Social Work Clinical Goal(s):  Marland Kitchen Over the next 90 days, patient will follow up with a therapist for ongoing mental health treatment* as directed by SW  Interventions: . Confirmed from patient that her [employer]  bill was paid through the [facility] . Patient confirmed that she has an initial appointment with the  [facility] scheduled for 09/03/20 however it was cancelled  due to a provider emergency and has not been re-scheduled . Discussed increased concern today due to job loss, emotional support provided, alternative options in regards to additional employment options explored . Discussed plans with patient for ongoing care management follow up and provided patient with direct contact information for care management team  Patient Self Care Activities:  - begin personal counseling - talk about feelings with a friend, family or spiritual advisor - practice positive thinking and self-talk    Task: Alleviate Barriers to Depression Treatment   Due Date: 01/29/2021  Priority: Routine  Note:   Care Management Activities:    - emotional support provided - participation in mental health treatment encouraged    Notes:      Follow Up Plan: Telephone follow up appointment with care management team member scheduled for: 10/14/20  Aris Georgia, LCSW

## 2020-10-07 ENCOUNTER — Telehealth: Payer: Self-pay | Admitting: *Deleted

## 2020-10-07 NOTE — Telephone Encounter (Signed)
   Chronic Care Management   Unsuccessful Call Note 10/07/2020 Name: Erika Solomon MRN: [id number] DOB: 1996-03-12  Erika Solomon is a 25 year old female who sees Osvaldo Angst, [place] for primary care. Osvaldo Angst PA-C asked the CCM team to consult the patient for Mental Health Counseling and Resources.     This social worker was unable to reach patient via telephone today for follow up call. I have left HIPAA compliant voicemail asking patient to return my call. (unsuccessful outreach #1).   Plan: Will follow-up within 7-14 business days via telephone.      Verna Czech, LCSW Clinical Social Worker  [facility] Care Management [phone]

## 2020-10-12 ENCOUNTER — Other Ambulatory Visit: Payer: Self-pay | Admitting: Physician Assistant

## 2020-10-12 DIAGNOSIS — I10 Essential (primary) hypertension: Secondary | ICD-10-CM

## 2020-10-12 NOTE — Telephone Encounter (Signed)
Requested Prescriptions  Pending Prescriptions Disp Refills  . amLODipine (NORVASC) 5 MG tablet [Pharmacy Med Name: AMLODIPINE BESYLATE 5 MG TAB] 90 tablet 0    Sig: TAKE 1 TABLET BY MOUTH EVERY DAY     Cardiovascular:  Calcium Channel Blockers Passed - 10/12/2020  9:24 AM      Passed - Last BP in normal range    BP Readings from Last 1 Encounters:  09/08/20 122/80         Passed - Valid encounter within last 6 months    Recent Outpatient Visits          2 months ago Cough   [facility] Osvaldo Angst M, PA-C   5 months ago Generalized abdominal pain   [facility] Osvaldo Angst M, [place]   6 months ago Nausea and vomiting, intractability of vomiting not specified, unspecified vomiting type   [facility] [place], [place], [place]   1 year ago Fever, unspecified fever cause   [facility] [place], Lavella [place], [place]   1 year ago Bronchitis   [facility] [place], [place]i [place], [place]

## 2020-10-14 ENCOUNTER — Telehealth: Payer: Self-pay | Admitting: *Deleted

## 2020-10-14 ENCOUNTER — Telehealth: Payer: Self-pay

## 2020-10-14 NOTE — Telephone Encounter (Signed)
   Chronic Care Management   Unsuccessful Call Note 10/14/2020 Name: Erika Solomon MRN: [id number] DOB: 10/03/1996  Patient  is a 25 year old female who sees  for primary care. Cynda Familia PA-C asked the CCM team to consult the patient for Mental Health Counseling and Resources.   This social worker was unable to reach patient via telephone today for follow up call. I have left HIPAA compliant voicemail asking patient to return my call. (unsuccessful outreach #2).   Plan: Will follow-up within 7-14 business days via telephone.      Verna Czech, LCSW Clinical Social Worker  [facility] Care Management [phone]

## 2020-10-21 ENCOUNTER — Telehealth (INDEPENDENT_AMBULATORY_CARE_PROVIDER_SITE_OTHER): Payer: Medicaid Other | Admitting: Physician Assistant

## 2020-10-21 DIAGNOSIS — J40 Bronchitis, not specified as acute or chronic: Secondary | ICD-10-CM | POA: Diagnosis not present

## 2020-10-21 DIAGNOSIS — R0602 Shortness of breath: Secondary | ICD-10-CM | POA: Diagnosis not present

## 2020-10-21 DIAGNOSIS — U071 COVID-19: Secondary | ICD-10-CM

## 2020-10-21 DIAGNOSIS — R059 Cough, unspecified: Secondary | ICD-10-CM

## 2020-10-21 MED ORDER — PROMETHAZINE-DM 6.25-15 MG/5ML PO SYRP: 5.0000 mL | ORAL_SOLUTION | Freq: Four times a day (QID) | ORAL | 0 refills | Status: DC | PRN

## 2020-10-21 MED ORDER — LEVALBUTEROL TARTRATE 45 MCG/ACT IN AERO: 2.0000 | INHALATION_SPRAY | Freq: Four times a day (QID) | RESPIRATORY_TRACT | 1 refills | Status: DC | PRN

## 2020-10-21 NOTE — Progress Notes (Signed)
MyChart Video Visit    Virtual Visit via Video Note   This visit type was conducted due to national recommendations for restrictions regarding the COVID-19 Pandemic (e.g. social distancing) in an effort to limit this patient's exposure and mitigate transmission in our community. This patient is at least at moderate risk for complications without adequate follow up. This format is felt to be most appropriate for this patient at this time. Physical exam was limited by quality of the video and audio technology used for the visit.   Patient location: Home  Provider location: Office   I discussed the limitations of evaluation and management by telemedicine and the availability of in person appointments. The patient expressed understanding and agreed to proceed.  Patient: Erika Solomon   DOB: 08/26/1996   25 y.o. Female  MRN: [id number] Visit Date: 10/21/2020  Today's healthcare provider: Trey Sailors, PA-C   Chief Complaint  Patient presents with  . Covid Positive  . URI  I,Porsha C McClurkin,acting as a scribe for [employer], PA-C.,have documented all relevant documentation on the behalf of Trey Sailors, PA-C,as directed by  Trey Sailors, PA-C while in the presence of Trey Sailors, PA-C.  Subjective    URI  This is a new problem. The current episode started in the past 7 days. The problem has been unchanged. The maximum temperature recorded prior to her arrival was 100.4 - 100.9 F. The fever has been present for 1 to 2 days. Associated symptoms include congestion, coughing, diarrhea, ear pain, headaches, nausea, rhinorrhea, a sore throat, vomiting and wheezing. Pertinent negatives include no sinus pain. She has tried acetaminophen, increased fluids and sleep for the symptoms. The treatment provided mild relief.  Tested positive for Covid on 10/18/2020. Symptoms began 10/17/2020. Currently having wheezing.    Medications: Outpatient Medications Prior to Visit   Medication Sig  . amLODipine (NORVASC) 5 MG tablet TAKE 1 TABLET BY MOUTH EVERY DAY  . levalbuterol (XOPENEX HFA) 45 MCG/ACT inhaler Inhale 2 puffs into the lungs every 6 (six) hours as needed for wheezing.  . benzonatate (TESSALON PERLES) 100 MG capsule Take 1 capsule (100 mg total) by mouth 3 (three) times daily as needed. (Patient not taking: No sig reported)  . Norethindrone-Ethinyl Estradiol-Fe Biphas (LO LOESTRIN FE) 1 MG-10 MCG / 10 MCG tablet Take 1 tablet by mouth daily. (Patient not taking: Reported on 10/21/2020)  . ondansetron (ZOFRAN) 4 MG tablet Take 1 tablet (4 mg total) by mouth every 6 (six) hours. (Patient not taking: No sig reported)  . promethazine (PHENERGAN) 12.5 MG tablet Take 1 tablet (12.5 mg total) by mouth every 8 (eight) hours as needed for nausea or vomiting. (Patient not taking: No sig reported)  . promethazine-dextromethorphan (PROMETHAZINE-DM) 6.25-15 MG/5ML syrup Take 5 mLs by mouth at bedtime as needed. (Patient not taking: No sig reported)   No facility-administered medications prior to visit.    Review of Systems  Constitutional: Positive for appetite change, chills, fatigue and fever (100.0).  HENT: Positive for congestion, ear pain, postnasal drip, rhinorrhea, sinus pressure and sore throat. Negative for sinus pain.   Respiratory: Positive for cough, chest tightness and wheezing. Negative for shortness of breath.   Gastrointestinal: Positive for diarrhea, nausea and vomiting.  Neurological: Positive for weakness and headaches.      Objective    There were no vitals taken for this visit.   Physical Exam Constitutional:      Appearance: Normal appearance. She is  ill-appearing.  Pulmonary:     Effort: Pulmonary effort is normal. No respiratory distress.  Neurological:     Mental Status: She is alert.  Psychiatric:        Mood and Affect: Mood normal.        Behavior: Behavior normal.        Assessment & Plan    1. COVID-19  Your COVID  test is positive. You should remain isolated and quarantine for at least 10 days from start of symptoms, or today if you are asymptomatic. You must be feeling better and be fever free without any fever reducers for at least 48 hours as well. Your household contacts should be tested as well as work contacts. If you feel worse or have increasing shortness of breath, you should be seen in person at urgent care or the emergency room.   - promethazine-dextromethorphan (PROMETHAZINE-DM) 6.25-15 MG/5ML syrup; Take 5 mLs by mouth 4 (four) times daily as needed for cough.  Dispense: 118 mL; Refill: 0  2. Bronchitis  - promethazine-dextromethorphan (PROMETHAZINE-DM) 6.25-15 MG/5ML syrup; Take 5 mLs by mouth 4 (four) times daily as needed for cough.  Dispense: 118 mL; Refill: 0  3. Cough  - levalbuterol (XOPENEX HFA) 45 MCG/ACT inhaler; Inhale 2 puffs into the lungs every 6 (six) hours as needed for wheezing.  Dispense: 1 each; Refill: 1  4. SOB (shortness of breath)  - levalbuterol (XOPENEX HFA) 45 MCG/ACT inhaler; Inhale 2 puffs into the lungs every 6 (six) hours as needed for wheezing.  Dispense: 1 each; Refill: 1   No follow-ups on file.     I discussed the assessment and treatment plan with the patient. The patient was provided an opportunity to ask questions and all were answered. The patient agreed with the plan and demonstrated an understanding of the instructions.   The patient was advised to call back or seek an in-person evaluation if the symptoms worsen or if the condition fails to improve as anticipated.   ITrey Sailors, PA-C, have reviewed all documentation for this visit. The documentation on 10/21/20 for the exam, diagnosis, procedures, and orders are all accurate and complete.  The entirety of the information documented in the History of Present Illness, Review of Systems and Physical Exam were personally obtained by me. Portions of this information were initially documented by  [facility] and reviewed by me for thoroughness and accuracy.    Maryella Shivers [facility] [phone] (phone) [phone] (fax)  [facility]

## 2020-10-23 ENCOUNTER — Telehealth: Payer: Self-pay | Admitting: *Deleted

## 2020-10-23 ENCOUNTER — Telehealth: Payer: Self-pay

## 2020-10-23 NOTE — Telephone Encounter (Signed)
    Care Management   Unsuccessful Call Note 10/23/2020 Name: MARLETTE CURVIN MRN: [id number] DOB: 1996/05/06  Patient  is a 25 year old female who sees Osvaldo Angst, [place] for primary care. Osvaldo Angst, PA-C asked the CCM team to consult the patient for community resources.   This social worker was unable to reach patient via telephone today for follow up call. I have left HIPAA compliant voicemail asking patient to return my call. (unsuccessful outreach #3).   Plan: This Child psychotherapist will not make any additional outreach calls to patient but will be happy to engage patient upon her return call.    Verna Czech, LCSW Clinical Social Worker  [facility] Care Management [phone]

## 2020-11-02 ENCOUNTER — Encounter: Payer: Self-pay | Admitting: Physician Assistant

## 2020-11-03 NOTE — Telephone Encounter (Signed)
If she has new symptoms that need evaluating she will need OV.

## 2020-11-19 NOTE — Progress Notes (Signed)
MyChart Video Visit    Virtual Visit via Video Note   This visit type was conducted due to national recommendations for restrictions regarding the COVID-19 Pandemic (e.g. social distancing) in an effort to limit this patient's exposure and mitigate transmission in our community. This patient is at least at moderate risk for complications without adequate follow up. This format is felt to be most appropriate for this patient at this time. Physical exam was limited by quality of the video and audio technology used for the visit.   Patient location: Home Provider location: Office  I discussed the limitations of evaluation and management by telemedicine and the availability of in person appointments. The patient expressed understanding and agreed to proceed.  Patient: Erika Solomon   DOB: November 09, 1995   25 y.o. Female  MRN: [id number] Visit Date: 11/20/2020  Today's healthcare provider: Trey Sailors, PA-C   Chief Complaint  Patient presents with  . URI  . Covid Positive  I,Zyir Gassert M Brighten Buzzelli,acting as a scribe for Trey Sailors, PA-C.,have documented all relevant documentation on the behalf of Trey Sailors, PA-C,as directed by  Trey Sailors, PA-C while in the presence of Trey Sailors, PA-C.  Subjective    URI  This is a new problem. The current episode started in the past 7 days. The problem has been unchanged. The maximum temperature recorded prior to her arrival was 100.4 - 100.9 F. Associated symptoms include coughing, diarrhea, headaches, nausea, rhinorrhea, sneezing, a sore throat and vomiting. Pertinent negatives include no chest pain, congestion, ear pain, sinus pain or wheezing.  Took 2 covid home test and both was positive. She had COVID one month ago. She felt symptoms since Monday, and has been vomiting and having diarrhea. She has been unable to eat for four days, she is drinking small amounts.    Medications: Outpatient Medications Prior to Visit   Medication Sig  . amLODipine (NORVASC) 5 MG tablet TAKE 1 TABLET BY MOUTH EVERY DAY  . levalbuterol (XOPENEX HFA) 45 MCG/ACT inhaler Inhale 2 puffs into the lungs every 6 (six) hours as needed for wheezing.  . benzonatate (TESSALON PERLES) 100 MG capsule Take 1 capsule (100 mg total) by mouth 3 (three) times daily as needed. (Patient not taking: No sig reported)  . Norethindrone-Ethinyl Estradiol-Fe Biphas (LO LOESTRIN FE) 1 MG-10 MCG / 10 MCG tablet Take 1 tablet by mouth daily. (Patient not taking: No sig reported)  . ondansetron (ZOFRAN) 4 MG tablet Take 1 tablet (4 mg total) by mouth every 6 (six) hours. (Patient not taking: No sig reported)  . promethazine-dextromethorphan (PROMETHAZINE-DM) 6.25-15 MG/5ML syrup Take 5 mLs by mouth 4 (four) times daily as needed for cough. (Patient not taking: Reported on 11/20/2020)  . [DISCONTINUED] promethazine (PHENERGAN) 12.5 MG tablet Take 1 tablet (12.5 mg total) by mouth every 8 (eight) hours as needed for nausea or vomiting. (Patient not taking: No sig reported)   No facility-administered medications prior to visit.    Review of Systems  Constitutional: Positive for appetite change, chills, fatigue and fever (99.0 & 101.0 per pt).  HENT: Positive for rhinorrhea, sneezing and sore throat. Negative for congestion, ear pain, postnasal drip, sinus pressure and sinus pain.   Respiratory: Positive for cough. Negative for chest tightness, shortness of breath and wheezing.   Cardiovascular: Negative for chest pain.  Gastrointestinal: Positive for diarrhea, nausea and vomiting.  Neurological: Positive for weakness and headaches.      Objective    There  were no vitals taken for this visit.   Physical Exam Constitutional:      Appearance: Normal appearance.  Pulmonary:     Effort: Pulmonary effort is normal. No respiratory distress.  Neurological:     Mental Status: She is alert.  Psychiatric:        Mood and Affect: Mood normal.         Behavior: Behavior normal.        Assessment & Plan    1. COVID-19  Your COVID test is positive. You should remain isolated and quarantine for at least 5 days from start of symptoms. You must be feeling better and be fever free without any fever reducers for at least 24 hours as well. You should wear a mask at all times when out of your home or around others for 5 days after leaving isolation.  Your household contacts should be tested as well as work contacts. If you feel worse or have increasing shortness of breath, you should be seen in person at urgent care or the emergency room.   2. Nausea and vomiting, intractability of vomiting not specified, unspecified vomiting type  Treat as below. Counseled she may need IV fluids if she cannot keep anything down and continues to feel worse.   - promethazine (PHENERGAN) 12.5 MG tablet; Take 1 tablet (12.5 mg total) by mouth every 8 (eight) hours as needed for nausea or vomiting.  Dispense: 20 tablet; Refill: 0   Return if symptoms worsen or fail to improve.     I discussed the assessment and treatment plan with the patient. The patient was provided an opportunity to ask questions and all were answered. The patient agreed with the plan and demonstrated an understanding of the instructions.   The patient was advised to call back or seek an in-person evaluation if the symptoms worsen or if the condition fails to improve as anticipated.   ITrey Sailors, PA-C, have reviewed all documentation for this visit. The documentation on 11/20/20 for the exam, diagnosis, procedures, and orders are all accurate and complete.  The entirety of the information documented in the History of Present Illness, Review of Systems and Physical Exam were personally obtained by me. Portions of this information were initially documented by [facility] and reviewed by me for thoroughness and accuracy.    Maryella Shivers [facility] [phone] (phone) [phone] (fax)  [facility]

## 2020-11-20 ENCOUNTER — Telehealth (INDEPENDENT_AMBULATORY_CARE_PROVIDER_SITE_OTHER): Payer: Medicaid Other | Admitting: Physician Assistant

## 2020-11-20 DIAGNOSIS — R112 Nausea with vomiting, unspecified: Secondary | ICD-10-CM | POA: Diagnosis not present

## 2020-11-20 DIAGNOSIS — U071 COVID-19: Secondary | ICD-10-CM

## 2020-11-20 MED ORDER — PROMETHAZINE HCL 12.5 MG PO TABS: 12.5000 mg | ORAL_TABLET | Freq: Three times a day (TID) | ORAL | 0 refills | Status: DC | PRN

## 2020-12-08 ENCOUNTER — Ambulatory Visit: Payer: BC Managed Care – PPO | Admitting: Obstetrics and Gynecology

## 2021-03-22 ENCOUNTER — Telehealth: Payer: Medicaid Other | Admitting: Physician Assistant

## 2021-03-22 DIAGNOSIS — Z20822 Contact with and (suspected) exposure to covid-19: Secondary | ICD-10-CM

## 2021-03-23 MED ORDER — NAPROXEN 500 MG PO TABS: 500.0000 mg | ORAL_TABLET | Freq: Two times a day (BID) | ORAL | 0 refills | Status: AC

## 2021-03-23 MED ORDER — BENZONATATE 100 MG PO CAPS
100.0000 mg | ORAL_CAPSULE | Freq: Three times a day (TID) | ORAL | 0 refills | Status: AC
Start: 2021-03-23 — End: 2021-03-28

## 2021-03-23 MED ORDER — FLUTICASONE PROPIONATE 50 MCG/ACT NA SUSP
2.0000 | Freq: Every day | NASAL | 0 refills | Status: DC
Start: 2021-03-23 — End: 2021-07-22

## 2021-03-23 NOTE — Progress Notes (Signed)
  E-Visit  for Positive Covid Test Result  We are sorry you are not feeling well. We are here to help!  You have tested positive for COVID-19, meaning that you were infected with the novel coronavirus and could give the virus to others.  It is vitally important that you stay home so you do not spread it to others.      Please continue isolation at home, for at least 10 days since the start of your symptoms and until you have had 24 hours with no fever (without taking a fever reducer) and with improving of symptoms.  If you have no symptoms but tested positive (or all symptoms resolve after 5 days and you have no fever) you can leave your house but continue to wear a mask around others for an additional 5 days. If you have a fever,continue to stay home until you have had 24 hours of no fever. Most cases improve 5-10 days from onset but we have seen a small number of patients who have gotten worse after the 10 days.  Please be sure to watch for worsening symptoms and remain taking the proper precautions.   Go to the nearest hospital ED for assessment if fever/cough/breathlessness are severe or illness seems like a threat to life.    The following symptoms may appear 2-14 days after exposure: Fever Cough Shortness of breath or difficulty breathing Chills Repeated shaking with chills Muscle pain Headache Sore throat New loss of taste or smell Fatigue Congestion or runny nose Nausea or vomiting Diarrhea  You have been enrolled in MyChart Home Monitoring for COVID-19. Daily you will receive a questionnaire within the MyChart website. Our COVID-19 response team will be monitoring your responses daily.  You can use medication such as prescription cough medication called Tessalon Perles 100 mg. You may take 1-2 capsules every 8 hours as needed for cough, prescription anti-inflammatory called Naprosyn 500 mg. Take twice daily as needed for fever or body aches for 2 weeks, and prescription for  Fluticasone nasal spray 2 sprays in each nostril one time per day  You may also take acetaminophen (Tylenol) as needed for fever.  HOME CARE: Only take medications as instructed by your medical team. Drink plenty of fluids and get plenty of rest. A steam or ultrasonic humidifier can help if you have congestion.   GET HELP RIGHT AWAY IF YOU HAVE EMERGENCY WARNING SIGNS.  Call 911 or proceed to your closest emergency facility if: You develop worsening high fever. Trouble breathing Bluish lips or face Persistent pain or pressure in the chest New confusion Inability to wake or stay awake You cough up blood. Your symptoms become more severe Inability to hold down food or fluids  This list is not all possible symptoms. Contact your medical provider for any symptoms that are severe or concerning to you.    Your e-visit answers were reviewed by a board certified advanced clinical practitioner to complete your personal care plan.  Depending on the condition, your plan could have included both over the counter or prescription medications.  If there is a problem please reply once you have received a response from your provider.  Your safety is important to us.  If you have drug allergies check your prescription carefully.    You can use MyChart to ask questions about today's visit, request a non-urgent call back, or ask for a work or school excuse for 24 hours related to this e-Visit. If it has been greater than   24 hours you will need to follow up with your provider, or enter a new e-Visit to address those concerns. You will get an e-mail in the next two days asking about your experience.  I hope that your e-visit has been valuable and will speed your recovery. Thank you for using e-visits.   Approximately 5 minutes was spent documenting and reviewing patient's chart.     

## 2021-06-22 ENCOUNTER — Encounter: Payer: Self-pay | Admitting: Physician Assistant

## 2021-06-22 ENCOUNTER — Telehealth: Payer: Medicaid Other | Admitting: Physician Assistant

## 2021-06-22 DIAGNOSIS — J020 Streptococcal pharyngitis: Secondary | ICD-10-CM | POA: Diagnosis not present

## 2021-06-22 MED ORDER — AMOXICILLIN 500 MG PO CAPS
500.0000 mg | ORAL_CAPSULE | Freq: Two times a day (BID) | ORAL | 0 refills | Status: AC
Start: 2021-06-22 — End: 2021-07-02

## 2021-06-22 NOTE — Patient Instructions (Signed)
Alois Cliche, thank you for joining Margaretann Loveless, PA-C for today's virtual visit.  While this provider is not your primary care provider (PCP), if your PCP is located in our provider database this encounter information will be shared with them immediately following your visit.  Consent: (Patient) Erika Solomon provided verbal consent for this virtual visit at the beginning of the encounter.  Current Medications:  Current Outpatient Medications:    amoxicillin (AMOXIL) 500 MG capsule, Take 1 capsule (500 mg total) by mouth 2 (two) times daily for 10 days., Disp: 20 capsule, Rfl: 0   amLODipine (NORVASC) 5 MG tablet, TAKE 1 TABLET BY MOUTH EVERY DAY, Disp: 90 tablet, Rfl: 0   benzonatate (TESSALON PERLES) 100 MG capsule, Take 1 capsule (100 mg total) by mouth 3 (three) times daily as needed. (Patient not taking: No sig reported), Disp: 20 capsule, Rfl: 0   fluticasone (FLONASE) 50 MCG/ACT nasal spray, Place 2 sprays into both nostrils daily., Disp: 16 g, Rfl: 0   levalbuterol (XOPENEX HFA) 45 MCG/ACT inhaler, Inhale 2 puffs into the lungs every 6 (six) hours as needed for wheezing., Disp: 1 each, Rfl: 1   Norethindrone-Ethinyl Estradiol-Fe Biphas (LO LOESTRIN FE) 1 MG-10 MCG / 10 MCG tablet, Take 1 tablet by mouth daily. (Patient not taking: No sig reported), Disp: 84 tablet, Rfl: 3   ondansetron (ZOFRAN) 4 MG tablet, Take 1 tablet (4 mg total) by mouth every 6 (six) hours. (Patient not taking: No sig reported), Disp: 12 tablet, Rfl: 0   promethazine (PHENERGAN) 12.5 MG tablet, Take 1 tablet (12.5 mg total) by mouth every 8 (eight) hours as needed for nausea or vomiting., Disp: 20 tablet, Rfl: 0   promethazine-dextromethorphan (PROMETHAZINE-DM) 6.25-15 MG/5ML syrup, Take 5 mLs by mouth 4 (four) times daily as needed for cough. (Patient not taking: Reported on 11/20/2020), Disp: 118 mL, Rfl: 0   Medications ordered in this encounter:  Meds ordered this encounter  Medications    amoxicillin (AMOXIL) 500 MG capsule    Sig: Take 1 capsule (500 mg total) by mouth 2 (two) times daily for 10 days.    Dispense:  20 capsule    Refill:  0    Order Specific Question:   Supervising Provider    Answer:   Hyacinth Meeker, BRIAN [[id number]]     *If you need refills on other medications prior to your next appointment, please contact your pharmacy*  Follow-Up: Call back or seek an in-person evaluation if the symptoms worsen or if the condition fails to improve as anticipated.  Other Instructions Strep Throat, Adult Strep throat is an infection of the throat. It is caused by germs (bacteria). Strep throat is common during the cold months of the year. It mostly affects children who are 63-38 years old. However, people of all ages can get it at any time of the year. This infection spreads from person to person through coughing, sneezing, or having close contact. What are the causes? This condition is caused by the Streptococcus pyogenes germ. What increases the risk? You care for young children. Children are more likely to get strep throat and may spread it to others. You go to crowded places. Germs can spread easily in such places. You kiss or touch someone who has strep throat. What are the signs or symptoms? Fever or chills. Redness, swelling, or pain in the tonsils or throat. Pain or trouble when swallowing. White or yellow spots on the tonsils or throat. Tender glands in the neck  and under the jaw. Bad breath. Red rash all over the body. This is rare. How is this treated? Medicines that kill germs (antibiotics). Medicines that treat pain or fever. These include: Ibuprofen or acetaminophen. Aspirin, only for people who are over the age of 85. Cough drops. Throat sprays. Follow these instructions at home: Medicines  Take over-the-counter and prescription medicines only as told by your doctor. Take your antibiotic medicine as told by your doctor. Do not stop taking the antibiotic  even if you start to feel better. Eating and drinking  If you have trouble swallowing, eat soft foods until your throat feels better. Drink enough fluid to keep your pee (urine) pale yellow. To help with pain, you may have: Warm fluids, such as soup and tea. Cold fluids, such as frozen desserts or popsicles. General instructions Rinse your mouth (gargle) with a salt-water mixture 3-4 times a day or as needed. To make a salt-water mixture, dissolve -1 tsp (3-6 g) of salt in 1 cup (237 mL) of warm water. Rest as much as you can. Stay home from work or school until you have been taking antibiotics for 24 hours. Do not smoke or use any products that contain nicotine or tobacco. If you need help quitting, ask your doctor. Keep all follow-up visits. How is this prevented?  Do not share food, drinking cups, or personal items. They can cause the germs to spread. Wash your hands well with soap and water. Make sure that all people in your house wash their hands well. Have family members tested if they have a fever or a sore throat. They may need an antibiotic if they have strep throat. Contact a doctor if: You have swelling in your neck that keeps getting bigger. You get a rash, cough, or earache. You cough up a thick fluid that is green, yellow-brown, or bloody. You have pain that does not get better with medicine. Your symptoms get worse instead of getting better. You have a fever. Get help right away if: You vomit. You have a very bad headache. Your neck hurts or feels stiff. You have chest pain or are short of breath. You have drooling, very bad throat pain, or changes in your voice. Your neck is swollen, or the skin gets red and tender. Your mouth is dry, or you are peeing less than normal. You keep feeling more tired or have trouble waking up. Your joints are red or painful. These symptoms may be an emergency. Do not wait to see if the symptoms will go away. Get help right away. Call  your local emergency services (911 in the [place] Summary Strep throat is an infection of the throat. It is caused by germs (bacteria). This infection can spread from person to person through coughing, sneezing, or having close contact. Take your medicines, including antibiotics, as told by your doctor. Do not stop taking the antibiotic even if you start to feel better. To prevent the spread of germs, wash your hands well with soap and water. Have others do the same. Do not share food, drinking cups, or personal items. Get help right away if you have a bad headache, chest pain, shortness of breath, a stiff or painful neck, or you vomit. This information is not intended to replace advice given to you by your health care provider. Make sure you discuss any questions you have with your health care provider. Document Revised: 01/13/2021 Document Reviewed: 01/13/2021 Elsevier Patient Education  2022 [employer].    If  you have been instructed to have an in-person evaluation today at a local Urgent Care facility, please use the link below. It will take you to a list of all of our available [facility], including address, phone number and hours of operation. Please do not delay care.  [facility]  If you or a family member do not have a primary care provider, use the link below to schedule a visit and establish care. When you choose a [facility] primary care physician or advanced practice provider, you gain a long-term partner in health. Find a Primary Care Provider  Learn more about [facility]'s in-office and virtual care options:  - Get Care Now

## 2021-06-22 NOTE — Progress Notes (Signed)
Virtual Visit Consent   Erika Solomon, you are scheduled for a virtual visit with a [facility] provider today.     Just as with appointments in the office, your consent must be obtained to participate.  Your consent will be active for this visit and any virtual visit you may have with one of our providers in the next 365 days.     If you have a MyChart account, a copy of this consent can be sent to you electronically.  All virtual visits are billed to your insurance company just like a traditional visit in the office.    As this is a virtual visit, video technology does not allow for your provider to perform a traditional examination.  This may limit your provider's ability to fully assess your condition.  If your provider identifies any concerns that need to be evaluated in person or the need to arrange testing (such as labs, EKG, etc.), we will make arrangements to do so.     Although advances in technology are sophisticated, we cannot ensure that it will always work on either your end or our end.  If the connection with a video visit is poor, the visit may have to be switched to a telephone visit.  With either a video or telephone visit, we are not always able to ensure that we have a secure connection.     I need to obtain your verbal consent now.   Are you willing to proceed with your visit today?    ANGELLY SPEARING has provided verbal consent on 06/22/2021 for a virtual visit (video or telephone).   Margaretann Loveless, PA-C   Date: 06/22/2021 9:39 AM   Virtual Visit via Video Note   I, Margaretann Loveless, connected with  Erika Solomon  ([id number], May 30, 1996) on 06/22/21 at  9:30 AM EDT by a video-enabled telemedicine application and verified that I am speaking with the correct person using two identifiers.  Location: Patient: Virtual Visit Location Patient: Home Provider: Virtual Visit Location Provider: Home Office   I discussed the limitations of evaluation and management  by telemedicine and the availability of in person appointments. The patient expressed understanding and agreed to proceed.    History of Present Illness: Erika Solomon is a 25 y.o. who identifies as a female who was assigned female at birth, and is being seen today for sore throat.  HPI: Sore Throat  This is a new problem. The current episode started yesterday. The problem has been gradually worsening. The maximum temperature recorded prior to her arrival was 100.4 - 100.9 F. The fever has been present for Less than 1 day. The pain is moderate. Associated symptoms include congestion, swollen glands and trouble swallowing. Pertinent negatives include no drooling or hoarse voice. Associated symptoms comments: Fatigue, myalgias, redness in posterior throat, and white exudate. She has had no exposure to strep or mono. She has tried acetaminophen for the symptoms. The treatment provided mild relief.     Problems:  Patient Active Problem List   Diagnosis Date Noted   Iron deficiency anemia 12/26/2016   Postpartum care following cesarean delivery 12/26/2016   Maternal varicella, non-immune 10/05/2016   Anemia of pregnancy in second trimester 10/05/2016   UTI in pregnancy, antepartum 05/24/2016    Allergies:  Allergies  Allergen Reactions   Albuterol Anaphylaxis   Benadryl [Diphenhydramine] Anaphylaxis   Buspar [Buspirone] Anaphylaxis   Strawberry Extract Anaphylaxis   Medications:  Current Outpatient Medications:  amoxicillin (AMOXIL) 500 MG capsule, Take 1 capsule (500 mg total) by mouth 2 (two) times daily for 10 days., Disp: 20 capsule, Rfl: 0   amLODipine (NORVASC) 5 MG tablet, TAKE 1 TABLET BY MOUTH EVERY DAY, Disp: 90 tablet, Rfl: 0   benzonatate (TESSALON PERLES) 100 MG capsule, Take 1 capsule (100 mg total) by mouth 3 (three) times daily as needed. (Patient not taking: No sig reported), Disp: 20 capsule, Rfl: 0   fluticasone (FLONASE) 50 MCG/ACT nasal spray, Place 2 sprays into  both nostrils daily., Disp: 16 g, Rfl: 0   levalbuterol (XOPENEX HFA) 45 MCG/ACT inhaler, Inhale 2 puffs into the lungs every 6 (six) hours as needed for wheezing., Disp: 1 each, Rfl: 1   Norethindrone-Ethinyl Estradiol-Fe Biphas (LO LOESTRIN FE) 1 MG-10 MCG / 10 MCG tablet, Take 1 tablet by mouth daily. (Patient not taking: No sig reported), Disp: 84 tablet, Rfl: 3   ondansetron (ZOFRAN) 4 MG tablet, Take 1 tablet (4 mg total) by mouth every 6 (six) hours. (Patient not taking: No sig reported), Disp: 12 tablet, Rfl: 0   promethazine (PHENERGAN) 12.5 MG tablet, Take 1 tablet (12.5 mg total) by mouth every 8 (eight) hours as needed for nausea or vomiting., Disp: 20 tablet, Rfl: 0   promethazine-dextromethorphan (PROMETHAZINE-DM) 6.25-15 MG/5ML syrup, Take 5 mLs by mouth 4 (four) times daily as needed for cough. (Patient not taking: Reported on 11/20/2020), Disp: 118 mL, Rfl: 0  Observations/Objective: Patient is well-developed, well-nourished in no acute distress.  Resting comfortably at home.  Head is normocephalic, atraumatic.  No labored breathing.  Speech is clear and coherent with logical content.  Patient is alert and oriented at baseline.    Assessment and Plan: 1. Strep pharyngitis - amoxicillin (AMOXIL) 500 MG capsule; Take 1 capsule (500 mg total) by mouth 2 (two) times daily for 10 days.  Dispense: 20 capsule; Refill: 0 - Worsening - Will treat with amoxicillin as above - May use tylenol and ibuprofen alternating as needed - Salt water gargles - Throat lozenges and chloraseptic spray for symptomatic management - Seek in person evaluation if symptoms worsen or fail to improve  Follow Up Instructions: I discussed the assessment and treatment plan with the patient. The patient was provided an opportunity to ask questions and all were answered. The patient agreed with the plan and demonstrated an understanding of the instructions.  A copy of instructions were sent to the patient via  MyChart.  The patient was advised to call back or seek an in-person evaluation if the symptoms worsen or if the condition fails to improve as anticipated.  Time:  I spent 10 minutes with the patient via telehealth technology discussing the above problems/concerns.    Margaretann Loveless, PA-C

## 2021-06-29 ENCOUNTER — Ambulatory Visit: Payer: Medicaid Other

## 2021-07-22 ENCOUNTER — Other Ambulatory Visit: Payer: Self-pay | Admitting: Physician Assistant

## 2021-07-22 ENCOUNTER — Telehealth: Payer: Medicaid Other | Admitting: Physician Assistant

## 2021-07-22 DIAGNOSIS — R6889 Other general symptoms and signs: Secondary | ICD-10-CM

## 2021-07-22 MED ORDER — FLUTICASONE PROPIONATE 50 MCG/ACT NA SUSP: 2.0000 | Freq: Every day | NASAL | 0 refills | Status: DC

## 2021-07-22 MED ORDER — LEVALBUTEROL TARTRATE 45 MCG/ACT IN AERO: 2.0000 | INHALATION_SPRAY | Freq: Four times a day (QID) | RESPIRATORY_TRACT | 1 refills | Status: DC | PRN

## 2021-07-22 MED ORDER — BENZONATATE 100 MG PO CAPS: 100.0000 mg | ORAL_CAPSULE | Freq: Three times a day (TID) | ORAL | 0 refills | Status: DC | PRN

## 2021-07-22 NOTE — Progress Notes (Signed)
Virtual Visit Consent   Erika Solomon, you are scheduled for a virtual visit with a [facility] provider today.     Just as with appointments in the office, your consent must be obtained to participate.  Your consent will be active for this visit and any virtual visit you may have with one of our providers in the next 365 days.     If you have a MyChart account, a copy of this consent can be sent to you electronically.  All virtual visits are billed to your insurance company just like a traditional visit in the office.    As this is a virtual visit, video technology does not allow for your provider to perform a traditional examination.  This may limit your provider's ability to fully assess your condition.  If your provider identifies any concerns that need to be evaluated in person or the need to arrange testing (such as labs, EKG, etc.), we will make arrangements to do so.     Although advances in technology are sophisticated, we cannot ensure that it will always work on either your end or our end.  If the connection with a video visit is poor, the visit may have to be switched to a telephone visit.  With either a video or telephone visit, we are not always able to ensure that we have a secure connection.     I need to obtain your verbal consent now.   Are you willing to proceed with your visit today?    Erika Solomon has provided verbal consent on 07/22/2021 for a virtual visit (video or telephone).   Piedad Climes, [place]   Date: 07/22/2021 12:10 PM   Virtual Visit via Video Note   I, Piedad Climes, connected with  Erika Solomon  ([id number], May 09, 1996) on 07/22/21 at 12:15 PM EDT by a video-enabled telemedicine application and verified that I am speaking with the correct person using two identifiers.  Location: Patient: Virtual Visit Location Patient: Home Provider: Virtual Visit Location Provider: Home Office   I discussed the limitations of evaluation and  management by telemedicine and the availability of in person appointments. The patient expressed understanding and agreed to proceed.    History of Present Illness: Erika Solomon is a 25 y.o. who identifies as a female who was assigned female at birth, and is being seen today for URI symptoms starting Monday morning with severe sore throat, voice hoarseness, fatigue, anorexia, headache, rhinorrhea with nasal congestion. Today has noted some wheezing with coughing and early in the morning but has not used her inhaler. Denies any chest pain or SOB. Fever 101-102 range since onset. Works at [employer] so has multiple exposures to little ones. Denies known COVID exposure, influenza exposure, RSV exposure within her classroom. Has not been COVID tested. Last had COVID > 2 months ago.   HPI: HPI  Problems:  Patient Active Problem List   Diagnosis Date Noted   Iron deficiency anemia 12/26/2016   Postpartum care following cesarean delivery 12/26/2016   Maternal varicella, non-immune 10/05/2016   Anemia of pregnancy in second trimester 10/05/2016   UTI in pregnancy, antepartum 05/24/2016    Allergies:  Allergies  Allergen Reactions   Albuterol Anaphylaxis   Benadryl [Diphenhydramine] Anaphylaxis   Buspar [Buspirone] Anaphylaxis   Strawberry Extract Anaphylaxis   Medications:  Current Outpatient Medications:    benzonatate (TESSALON) 100 MG capsule, Take 1 capsule (100 mg total) by mouth 3 (three) times daily as needed  for cough., Disp: 30 capsule, Rfl: 0   fluticasone (FLONASE) 50 MCG/ACT nasal spray, Place 2 sprays into both nostrils daily., Disp: 16 g, Rfl: 0   levalbuterol (XOPENEX HFA) 45 MCG/ACT inhaler, Inhale 2 puffs into the lungs every 6 (six) hours as needed for wheezing., Disp: 1 each, Rfl: 1  Observations/Objective: Patient is well-developed, well-nourished in no acute distress.  Resting comfortably at home.  Head is normocephalic, atraumatic.  No labored breathing. Speech is  clear and coherent with logical content.  Patient is alert and oriented at baseline.   Assessment and Plan: 1. Flu-like symptoms - benzonatate (TESSALON) 100 MG capsule; Take 1 capsule (100 mg total) by mouth 3 (three) times daily as needed for cough.  Dispense: 30 capsule; Refill: 0 - levalbuterol (XOPENEX HFA) 45 MCG/ACT inhaler; Inhale 2 puffs into the lungs every 6 (six) hours as needed for wheezing.  Dispense: 1 each; Refill: 1 - fluticasone (FLONASE) 50 MCG/ACT nasal spray; Place 2 sprays into both nostrils daily.  Dispense: 16 g; Refill: 0 Will have her take COVID test just to be cautious. Seems more in line with the flu but she is outside of antiviral window. Will start her on supportive measures. OTC medications reviewed. Levalbuterol refilled. Tessalon and Flonase per orders. Strict follow-up discussed. If COVID negative she is to still remain home until fever free for 24 hours without medications and feeling better. Work note written. Strict ER precautions reviewed.   Follow Up Instructions: I discussed the assessment and treatment plan with the patient. The patient was provided an opportunity to ask questions and all were answered. The patient agreed with the plan and demonstrated an understanding of the instructions.  A copy of instructions were sent to the patient via MyChart unless otherwise noted below.   The patient was advised to call back or seek an in-person evaluation if the symptoms worsen or if the condition fails to improve as anticipated.  Time:  I spent 12 minutes with the patient via telehealth technology discussing the above problems/concerns.    Piedad Climes, PA-C

## 2021-07-22 NOTE — Patient Instructions (Signed)
Alois Cliche, thank you for joining Piedad Climes, PA-C for today's virtual visit.  While this provider is not your primary care provider (PCP), if your PCP is located in our provider database this encounter information will be shared with them immediately following your visit.  Consent: (Patient) Erika Solomon provided verbal consent for this virtual visit at the beginning of the encounter.  Current Medications:  Current Outpatient Medications:    amLODipine (NORVASC) 5 MG tablet, TAKE 1 TABLET BY MOUTH EVERY DAY, Disp: 90 tablet, Rfl: 0   benzonatate (TESSALON PERLES) 100 MG capsule, Take 1 capsule (100 mg total) by mouth 3 (three) times daily as needed. (Patient not taking: No sig reported), Disp: 20 capsule, Rfl: 0   fluticasone (FLONASE) 50 MCG/ACT nasal spray, Place 2 sprays into both nostrils daily., Disp: 16 g, Rfl: 0   levalbuterol (XOPENEX HFA) 45 MCG/ACT inhaler, Inhale 2 puffs into the lungs every 6 (six) hours as needed for wheezing., Disp: 1 each, Rfl: 1   Norethindrone-Ethinyl Estradiol-Fe Biphas (LO LOESTRIN FE) 1 MG-10 MCG / 10 MCG tablet, Take 1 tablet by mouth daily. (Patient not taking: No sig reported), Disp: 84 tablet, Rfl: 3   ondansetron (ZOFRAN) 4 MG tablet, Take 1 tablet (4 mg total) by mouth every 6 (six) hours. (Patient not taking: No sig reported), Disp: 12 tablet, Rfl: 0   promethazine (PHENERGAN) 12.5 MG tablet, Take 1 tablet (12.5 mg total) by mouth every 8 (eight) hours as needed for nausea or vomiting., Disp: 20 tablet, Rfl: 0   promethazine-dextromethorphan (PROMETHAZINE-DM) 6.25-15 MG/5ML syrup, Take 5 mLs by mouth 4 (four) times daily as needed for cough. (Patient not taking: Reported on 11/20/2020), Disp: 118 mL, Rfl: 0   Medications ordered in this encounter:  No orders of the defined types were placed in this encounter.    *If you need refills on other medications prior to your next appointment, please contact your pharmacy*  Follow-Up: Call  back or seek an in-person evaluation if the symptoms worsen or if the condition fails to improve as anticipated.  Other Instructions Take a COVID test as discussed and quarantine until results are in. If positive, let us know. Otherwise we are treating this as a flu-like illness. You should remain at home until fever free for 24 hours without medication and feeling better. I have sent a work note to Pharmacologist. Keep well-hydrated and get plenty of rest. I have sent in a script for Flonase and Tessalon (cough med).  I have also put a refill on file for your Xopenex. Alterate tylenol and ibuprofen for headache, fevers and/or body aches.  Salt water gargles and chloraseptic spray can also be beneficial for sore throat. Run a humidifier in the bedroom. Can use OTC mucinex or theralfu -- being mindful of types that contain tylenol (do not need to double dip)  If there is any acute worsening of symptoms, any chest pain or any significant SOB, please be evaluated at nearest ER.   If you have been instructed to have an in-person evaluation today at a local Urgent Care facility, please use the link below. It will take you to a list of all of our available [facility], including address, phone number and hours of operation. Please do not delay care.  [facility]  If you or a family member do not have a primary care provider, use the link below to schedule a visit and establish care. When you choose  a [facility] primary care physician or advanced practice provider, you gain a long-term partner in health. Find a Primary Care Provider  Learn more about [facility]'s in-office and virtual care options: [facility]

## 2021-08-31 ENCOUNTER — Telehealth: Payer: Medicaid Other | Admitting: Family Medicine

## 2021-08-31 DIAGNOSIS — R6889 Other general symptoms and signs: Secondary | ICD-10-CM

## 2021-08-31 DIAGNOSIS — R051 Acute cough: Secondary | ICD-10-CM

## 2021-08-31 DIAGNOSIS — R0981 Nasal congestion: Secondary | ICD-10-CM | POA: Diagnosis not present

## 2021-08-31 MED ORDER — BENZONATATE 100 MG PO CAPS: 100.0000 mg | ORAL_CAPSULE | Freq: Two times a day (BID) | ORAL | 0 refills | Status: DC | PRN

## 2021-08-31 MED ORDER — OSELTAMIVIR PHOSPHATE 75 MG PO CAPS: 75.0000 mg | ORAL_CAPSULE | Freq: Two times a day (BID) | ORAL | 0 refills | Status: AC

## 2021-08-31 MED ORDER — FLUTICASONE PROPIONATE 50 MCG/ACT NA SUSP: 2.0000 | Freq: Every day | NASAL | 0 refills | Status: DC

## 2021-08-31 MED ORDER — PREDNISONE 10 MG (21) PO TBPK: ORAL_TABLET | ORAL | 0 refills | Status: DC

## 2021-08-31 MED ORDER — PROMETHAZINE-DM 6.25-15 MG/5ML PO SYRP: 2.5000 mL | ORAL_SOLUTION | Freq: Four times a day (QID) | ORAL | 0 refills | Status: DC | PRN

## 2021-08-31 NOTE — Patient Instructions (Addendum)
I appreciate the opportunity to provide you with care for your health and wellness.  Take medication as directed  Please call PCP if not improved or go to urgent care if needed  Work note in letters section of mychart  Please continue to practice social distancing to keep you, your family, and our community safe.  If you must go out, please wear a mask and practice good handwashing.  Have a wonderful day. With Gratitude, Tereasa Coop, DNP, AGNP-BC    Influenza, Adult Influenza is also called "the flu." It is an infection in the lungs, nose, and throat (respiratory tract). It spreads easily from person to person (is contagious). The flu causes symptoms that are like a cold, along with high fever and body aches. What are the causes? This condition is caused by the influenza virus. You can get the virus by: Breathing in droplets that are in the air after a person infected with the flu coughed or sneezed. Touching something that has the virus on it and then touching your mouth, nose, or eyes. What increases the risk? Certain things may make you more likely to get the flu. These include: Not washing your hands often. Having close contact with many people during cold and flu season. Touching your mouth, eyes, or nose without first washing your hands. Not getting a flu shot every year. You may have a higher risk for the flu, and serious problems, such as a lung infection (pneumonia), if you: Are older than 65. Are pregnant. Have a weakened disease-fighting system (immune system) because of a disease or because you are taking certain medicines. Have a long-term (chronic) condition, such as: Heart, kidney, or lung disease. Diabetes. Asthma. Have a liver disorder. Are very overweight (morbidly obese). Have anemia. What are the signs or symptoms? Symptoms usually begin suddenly and last 4-14 days. They may include: Fever and chills. Headaches, body aches, or muscle aches. Sore  throat. Cough. Runny or stuffy (congested) nose. Feeling discomfort in your chest. Not wanting to eat as much as normal. Feeling weak or tired. Feeling dizzy. Feeling sick to your stomach or throwing up. How is this treated? If the flu is found early, you can be treated with antiviral medicine. This can help to reduce how bad the illness is and how long it lasts. This may be given by mouth or through an IV tube. Taking care of yourself at home can help your symptoms get better. Your doctor may want you to: Take over-the-counter medicines. Drink plenty of fluids. The flu often goes away on its own. If you have very bad symptoms or other problems, you may be treated in a hospital. Follow these instructions at home:   Activity Rest as needed. Get plenty of sleep. Stay home from work or school as told by your doctor. Do not leave home until you do not have a fever for 24 hours without taking medicine. Leave home only to go to your doctor. Eating and drinking Take an ORS (oral rehydration solution). This is a drink that is sold at pharmacies and stores. Drink enough fluid to keep your pee pale yellow. Drink clear fluids in small amounts as you are able. Clear fluids include: Water. Ice chips. Fruit juice mixed with water. Low-calorie sports drinks. Eat bland foods that are easy to digest. Eat small amounts as you are able. These foods include: Bananas. Applesauce. Rice. Lean meats. Toast. Crackers. Do not eat or drink: Fluids that have a lot of sugar or caffeine. Alcohol.  Spicy or fatty foods. General instructions Take over-the-counter and prescription medicines only as told by your doctor. Use a cool mist humidifier to add moisture to the air in your home. This can make it easier for you to breathe. When using a cool mist humidifier, clean it daily. Empty water and replace with clean water. Cover your mouth and nose when you cough or sneeze. Wash your hands with soap and  water often and for at least 20 seconds. This is also important after you cough or sneeze. If you cannot use soap and water, use alcohol-based hand sanitizer. Keep all follow-up visits. How is this prevented?  Get a flu shot every year. You may get the flu shot in late summer, fall, or winter. Ask your doctor when you should get your flu shot. Avoid contact with people who are sick during fall and winter. This is cold and flu season. Contact a doctor if: You get new symptoms. You have: Chest pain. Watery poop (diarrhea). A fever. Your cough gets worse. You start to have more mucus. You feel sick to your stomach. You throw up. Get help right away if you: Have shortness of breath. Have trouble breathing. Have skin or nails that turn a bluish color. Have very bad pain or stiffness in your neck. Get a sudden headache. Get sudden pain in your face or ear. Cannot eat or drink without throwing up. These symptoms may represent a serious problem that is an emergency. Get medical help right away. Call your local emergency services (911 in the [place] Do not wait to see if the symptoms will go away. Do not drive yourself to the hospital. Summary Influenza is also called "the flu." It is an infection in the lungs, nose, and throat. It spreads easily from person to person. Take over-the-counter and prescription medicines only as told by your doctor. Getting a flu shot every year is the best way to not get the flu. This information is not intended to replace advice given to you by your health care provider. Make sure you discuss any questions you have with your health care provider. Document Revised: 05/09/2020 Document Reviewed: 05/09/2020 Elsevier Patient Education  2022 [employer].

## 2021-08-31 NOTE — Progress Notes (Signed)
Virtual Visit Consent   DEVERA ENGLANDER, you are scheduled for a virtual visit with a [facility] provider today.     Just as with appointments in the office, your consent must be obtained to participate.  Your consent will be active for this visit and any virtual visit you may have with one of our providers in the next 365 days.     If you have a MyChart account, a copy of this consent can be sent to you electronically.  All virtual visits are billed to your insurance company just like a traditional visit in the office.    As this is a virtual visit, video technology does not allow for your provider to perform a traditional examination.  This may limit your provider's ability to fully assess your condition.  If your provider identifies any concerns that need to be evaluated in person or the need to arrange testing (such as labs, EKG, etc.), we will make arrangements to do so.     Although advances in technology are sophisticated, we cannot ensure that it will always work on either your end or our end.  If the connection with a video visit is poor, the visit may have to be switched to a telephone visit.  With either a video or telephone visit, we are not always able to ensure that we have a secure connection.     I need to obtain your verbal consent now.   Are you willing to proceed with your visit today?    [name] has provided verbal consent on 08/31/2021 for a virtual visit (video or telephone).   Freddy Finner, NP   Date: 08/31/2021 8:15 AM   Virtual Visit via Video Note   I, Freddy Finner, connected with  Erika Solomon  ([id number], 1996/09/14) on 08/31/21 at  8:15 AM EST by a video-enabled telemedicine application and verified that I am speaking with the correct person using two identifiers.  Location: Patient: Virtual Visit Location Patient: Home Provider: Virtual Visit Location Provider: Home Office   I discussed the limitations of evaluation and management by  telemedicine and the availability of in person appointments. The patient expressed understanding and agreed to proceed.    History of Present Illness: Erika Solomon is a 25 y.o. who identifies as a female who was assigned female at birth, and is being seen today for flu like symptoms  Temp Max was 101.6 Congestion-with cough is mild- more barking like sound in nature.  HPI: Influenza This is a new problem. The current episode started yesterday. The problem occurs constantly. The problem has been gradually worsening. Associated symptoms include anorexia, chills, congestion, coughing, fatigue, a fever, headaches, myalgias, nausea and a sore throat. Pertinent negatives include no abdominal pain or arthralgias. Associated symptoms comments: Chest pain related to coughing she reports. The symptoms are aggravated by coughing. She has tried acetaminophen, NSAIDs and rest for the symptoms. The treatment provided mild relief.    No flu vaccine this year  Problems:  Patient Active Problem List   Diagnosis Date Noted   Iron deficiency anemia 12/26/2016   Postpartum care following cesarean delivery 12/26/2016   Maternal varicella, non-immune 10/05/2016   Anemia of pregnancy in second trimester 10/05/2016   UTI in pregnancy, antepartum 05/24/2016    Allergies:  Allergies  Allergen Reactions   Albuterol Anaphylaxis   Benadryl [Diphenhydramine] Anaphylaxis   Buspar [Buspirone] Anaphylaxis   Strawberry Extract Anaphylaxis   Medications:  Current Outpatient Medications:  benzonatate (TESSALON) 100 MG capsule, Take 1 capsule (100 mg total) by mouth 3 (three) times daily as needed for cough., Disp: 30 capsule, Rfl: 0   fluticasone (FLONASE) 50 MCG/ACT nasal spray, Place 2 sprays into both nostrils daily., Disp: 16 g, Rfl: 0   levalbuterol (XOPENEX HFA) 45 MCG/ACT inhaler, Inhale 2 puffs into the lungs every 6 (six) hours as needed for wheezing., Disp: 1 each, Rfl:  1  Observations/Objective: Patient is well-developed, well-nourished in no acute distress.  Resting comfortably  at home.  Head is normocephalic, atraumatic.  No labored breathing.  Speech is clear and coherent with logical content.  Patient is alert and oriented at baseline.  Cough present and barking like   Assessment and Plan:  1. Flu-like symptoms  - predniSONE (STERAPRED UNI-PAK 21 TAB) 10 MG (21) TBPK tablet; Take as directed  Dispense: 21 tablet; Refill: 0 - oseltamivir (TAMIFLU) 75 MG capsule; Take 1 capsule (75 mg total) by mouth 2 (two) times daily for 5 days.  Dispense: 10 capsule; Refill: 0  2. Acute cough  - predniSONE (STERAPRED UNI-PAK 21 TAB) 10 MG (21) TBPK tablet; Take as directed  Dispense: 21 tablet; Refill: 0 - promethazine-dextromethorphan (PROMETHAZINE-DM) 6.25-15 MG/5ML syrup; Take 2.5 mLs by mouth 4 (four) times daily as needed for cough.  Dispense: 118 mL; Refill: 0 - benzonatate (TESSALON) 100 MG capsule; Take 1 capsule (100 mg total) by mouth 2 (two) times daily as needed for cough.  Dispense: 20 capsule; Refill: 0  3. Congestion of nasal sinus  - fluticasone (FLONASE) 50 MCG/ACT nasal spray; Place 2 sprays into both nostrils daily.  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   Work note provided    Reviewed side effects, risks and benefits of medication.    I discussed the assessment and treatment plan with the patient. The patient was provided an opportunity to ask questions and all were answered. The patient agreed with the plan and demonstrated an understanding of the instructions.   The patient was advised to call back or seek an in-person  evaluation if the symptoms worsen or if the condition fails to improve as anticipated.   The above assessment and management plan was discussed with the patient. The patient verbalized understanding of and has agreed to the management plan. Patient is aware to call the clinic if symptoms persist or worsen. Patient is aware when to return to the clinic for a follow-up visit. Patient educated on when it is appropriate to go to the emergency department.     Reviewed side effects, risks and benefits of medication.    Patient acknowledged agreement and understanding of the plan.   I discussed the assessment and treatment plan with the patient. The patient was provided an opportunity to ask questions and all were answered. The patient agreed with the plan and demonstrated an understanding of the instructions.   The patient was advised to call back or seek an in-person evaluation if the symptoms worsen or if the condition fails to improve as anticipated.   The above assessment and management plan was discussed with the patient. The patient verbalized understanding of and has agreed  to the management plan. Patient is aware to call the clinic if symptoms persist or worsen. Patient is aware when to return to the clinic for a follow-up visit. Patient educated on when it is appropriate to go to the emergency department.   Follow Up Instructions: I discussed the assessment and treatment plan with the patient. The patient was provided an opportunity to ask questions and all were answered. The patient agreed with the plan and demonstrated an understanding of the instructions.  A copy of instructions were sent to the patient via MyChart unless otherwise noted below.    The patient was advised to call back or seek an in-person evaluation if the symptoms worsen or if the condition fails to improve as anticipated.  Time:  I spent 10 minutes with the patient via telehealth technology discussing the above  problems/concerns.    Freddy Finner, NP

## 2021-09-01 ENCOUNTER — Encounter: Payer: Self-pay | Admitting: Family Medicine

## 2021-12-23 ENCOUNTER — Ambulatory Visit: Payer: Medicaid Other | Admitting: Adult Health

## 2022-03-29 ENCOUNTER — Emergency Department
Admission: EM | Admit: 2022-03-29 | Discharge: 2022-03-29 | Disposition: A | Discharge status: Home / Self Care | Payer: Medicaid Other | Attending: Emergency Medicine | Admitting: Emergency Medicine

## 2022-03-29 ENCOUNTER — Other Ambulatory Visit: Payer: Self-pay

## 2022-03-29 ENCOUNTER — Emergency Department: Payer: Medicaid Other

## 2022-03-29 ENCOUNTER — Encounter: Payer: Self-pay | Admitting: Emergency Medicine

## 2022-03-29 DIAGNOSIS — W010XXA Fall on same level from slipping, tripping and stumbling without subsequent striking against object, initial encounter: Secondary | ICD-10-CM | POA: Diagnosis not present

## 2022-03-29 DIAGNOSIS — S93492A Sprain of other ligament of left ankle, initial encounter: Secondary | ICD-10-CM | POA: Insufficient documentation

## 2022-03-29 DIAGNOSIS — S99912A Unspecified injury of left ankle, initial encounter: Secondary | ICD-10-CM | POA: Diagnosis present

## 2022-05-17 ENCOUNTER — Telehealth: Payer: Self-pay | Admitting: Obstetrics & Gynecology

## 2022-05-17 NOTE — Telephone Encounter (Signed)
Contacted pt to schedule OB appts.  Left message for pt to call back to schedule.

## 2022-05-21 ENCOUNTER — Ambulatory Visit (INDEPENDENT_AMBULATORY_CARE_PROVIDER_SITE_OTHER): Payer: Medicaid Other

## 2022-05-21 VITALS — Wt 230.0 lb

## 2022-05-21 DIAGNOSIS — Z3480 Encounter for supervision of other normal pregnancy, unspecified trimester: Secondary | ICD-10-CM | POA: Insufficient documentation

## 2022-05-21 DIAGNOSIS — Z369 Encounter for antenatal screening, unspecified: Secondary | ICD-10-CM

## 2022-05-21 DIAGNOSIS — O09899 Supervision of other high risk pregnancies, unspecified trimester: Secondary | ICD-10-CM

## 2022-05-21 DIAGNOSIS — Z3A00 Weeks of gestation of pregnancy not specified: Secondary | ICD-10-CM

## 2022-05-21 NOTE — Progress Notes (Signed)
New OB Intake  I connected with  Erika Solomon on 05/21/22 at  9:15 AM EDT by telephone and verified that I am speaking with the correct person using two identifiers. Nurse is located at [facility] and pt is located at home.  I explained I am completing New OB Intake today. We discussed her EDD of 01/18/2023 that is based on LMP of 04/13/2022. Pt is G4/P3003. Pt states preg was confirmed at [facility] in [place], [place].  I reviewed her allergies, medications, Medical/Surgical/OB history, and appropriate screenings. Based on history, this is a/an pregnancy complicated by High Blood Pressure, hx of preeclampsia, elevated blood sugars but not dx'd c DM, anemia during 2nd preg.  Patient Active Problem List   Diagnosis Date Noted   Supervision of other normal pregnancy, antepartum 05/21/2022   Iron deficiency anemia 12/26/2016   Postpartum care following cesarean delivery 12/26/2016   Maternal varicella, non-immune 10/05/2016   Anemia of pregnancy in second trimester 10/05/2016   UTI in pregnancy, antepartum 05/24/2016    Concerns addressed today Pt to bring in logs of BP and blood sugars; adv to stay on meds and d/w provider at [facility] exam.  Delivery Plans:  Plans to deliver at [facility].  Anatomy US Explained first scheduled [place] will be around 20 weeks.   Labs Discussed genetic screening with patient. Patient desires genetic testing to be drawn with new OB labs. Discussed possible labs to be drawn at new OB appointment.  COVID Vaccine Patient has had COVID vaccine (one dose only)  Social Determinants of Health Food Insecurity: denies food insecurity Transportation: Patient denies transportation needs. Childcare: Discussed no children allowed at ultrasound appointments.   First visit review I reviewed new OB appt with pt. I explained she will have ob bloodwork and pap smear/pelvic exam if indicated. Explained pt will be seen by Paula Compton, CNM at first visit;  encounter routed to appropriate provider.   Loran Senters, [facility] 05/21/2022  9:51 AM  Clinical Staff Provider  Office Location  [facility] Dating    Language  English Anatomy US    Flu Vaccine  offer Genetic Screen  NIPS:   TDaP vaccine   offer Hgb A1C or  GTT Early : Third trimester :   Covid One dose   LAB RESULTS   Rhogam   Blood Type     Feeding Plan breast Antibody    Contraception undecided Rubella    Circumcision yes RPR     Pediatrician  [facility]     Support Person Erika Solomon HIV    Prenatal Classes no Varicella     GBS  (For PCN allergy, check sensitivities)   BTL Consent  Hep C   VBAC Consent  Pap      Hgb Electro      CF      SMA

## 2022-05-24 ENCOUNTER — Other Ambulatory Visit: Payer: Medicaid Other

## 2022-05-24 DIAGNOSIS — Z3480 Encounter for supervision of other normal pregnancy, unspecified trimester: Secondary | ICD-10-CM

## 2022-05-24 DIAGNOSIS — Z369 Encounter for antenatal screening, unspecified: Secondary | ICD-10-CM

## 2022-05-25 LAB — CBC/D/PLT+RPR+RH+ABO+RUBIGG...
Antibody Screen: NEGATIVE
Basophils Absolute: 0 10*3/uL (ref 0.0–0.2)
Basos: 0 %
EOS (ABSOLUTE): 0.1 10*3/uL (ref 0.0–0.4)
Eos: 1 %
HCV Ab: NONREACTIVE
HIV Screen 4th Generation wRfx: NONREACTIVE
Hematocrit: 32.5 % — ABNORMAL LOW (ref 34.0–46.6)
Hemoglobin: 10.8 g/dL — ABNORMAL LOW (ref 11.1–15.9)
Hepatitis B Surface Ag: NEGATIVE
Immature Grans (Abs): 0 10*3/uL (ref 0.0–0.1)
Immature Granulocytes: 0 %
Lymphocytes Absolute: 1.5 10*3/uL (ref 0.7–3.1)
Lymphs: 21 %
MCH: 26.9 pg (ref 26.6–33.0)
MCHC: 33.2 g/dL (ref 31.5–35.7)
MCV: 81 fL (ref 79–97)
Monocytes Absolute: 0.3 10*3/uL (ref 0.1–0.9)
Monocytes: 5 %
Neutrophils Absolute: 5 10*3/uL (ref 1.4–7.0)
Neutrophils: 73 %
Platelets: 275 10*3/uL (ref 150–450)
RBC: 4.02 x10E6/uL (ref 3.77–5.28)
RDW: 16.4 % — ABNORMAL HIGH (ref 11.7–15.4)
RPR Ser Ql: NONREACTIVE
Rh Factor: POSITIVE
Rubella Antibodies, IGG: <0.9 index — ABNORMAL LOW (ref >0.99)
Varicella zoster IgG: <135 index — ABNORMAL LOW (ref >165)
WBC: 6.9 10*3/uL (ref 3.4–10.8)

## 2022-05-25 LAB — HCV INTERPRETATION

## 2022-06-03 ENCOUNTER — Encounter: Payer: Self-pay | Admitting: Obstetrics

## 2022-06-03 ENCOUNTER — Other Ambulatory Visit: Payer: Self-pay | Admitting: Obstetrics

## 2022-06-03 ENCOUNTER — Ambulatory Visit (INDEPENDENT_AMBULATORY_CARE_PROVIDER_SITE_OTHER): Payer: Medicaid Other | Admitting: Obstetrics

## 2022-06-03 ENCOUNTER — Other Ambulatory Visit (HOSPITAL_COMMUNITY)
Admission: RE | Admit: 2022-06-03 | Discharge: 2022-06-03 | Disposition: A | Discharge status: Home / Self Care | Payer: Medicaid Other | Source: Ambulatory Visit | Attending: Obstetrics | Admitting: Obstetrics

## 2022-06-03 VITALS — BP 130/80 | Wt 227.0 lb

## 2022-06-03 DIAGNOSIS — Z124 Encounter for screening for malignant neoplasm of cervix: Secondary | ICD-10-CM | POA: Insufficient documentation

## 2022-06-03 DIAGNOSIS — O219 Vomiting of pregnancy, unspecified: Secondary | ICD-10-CM

## 2022-06-03 DIAGNOSIS — Z3480 Encounter for supervision of other normal pregnancy, unspecified trimester: Secondary | ICD-10-CM

## 2022-06-03 DIAGNOSIS — O0990 Supervision of high risk pregnancy, unspecified, unspecified trimester: Secondary | ICD-10-CM | POA: Insufficient documentation

## 2022-06-03 DIAGNOSIS — O24011 Pre-existing diabetes mellitus, type 1, in pregnancy, first trimester: Secondary | ICD-10-CM

## 2022-06-03 DIAGNOSIS — O24111 Pre-existing diabetes mellitus, type 2, in pregnancy, first trimester: Secondary | ICD-10-CM

## 2022-06-03 DIAGNOSIS — O10019 Pre-existing essential hypertension complicating pregnancy, unspecified trimester: Secondary | ICD-10-CM

## 2022-06-03 DIAGNOSIS — Z3A01 Less than 8 weeks gestation of pregnancy: Secondary | ICD-10-CM

## 2022-06-03 MED ORDER — ONDANSETRON 4 MG PO TBDP: 4.0000 mg | ORAL_TABLET | Freq: Four times a day (QID) | ORAL | 0 refills | Status: DC | PRN

## 2022-06-03 NOTE — Telephone Encounter (Signed)
I was going to send in Diclegis but an allergy popped up to Diphenhydramine.. Please advise

## 2022-06-03 NOTE — Progress Notes (Signed)
New Obstetric Patient H&P    Chief Complaint: "Desires prenatal care"   History of Present Illness: Patient is a 26 y.o. [phone] Not Hispanic or Latino female, LMP 04/13/2022 presents with amenorrhea and positive home pregnancy test. Based on her  LMP, her EDD is Estimated Date of Delivery: 01/18/23 and her EGA is [redacted]w[redacted]d. Cycles are 6. days, regular, and occur approximately every : 28 days. Her last pap smear was about 4 years ago and was no abnormalities.    She had a urine pregnancy test which was positive about 3 week(s)  ago. Her last menstrual period was normal and lasted for  about 5 day(s). Since her LMP she claims she has experienced breast tenderness and loss of appetite. She denies vaginal bleeding. Her past medical history is contibutory.She has been tested for diabetes and HTN over the last few months, but has not had routine follow up so, she may be a chronic hypertensive. Today she brings some glucose readings and they are elevated, up to 170s for her 2 hour Postprandial readings. She has not had dietary counsel. Her blood pressures have also been elevated at home. Her prior pregnancies are notable for gestational HTN, pre-eclampsia, and one CS for a 10 lb baby.She is uncertain about mode of delivery for this pregnancy  Since her LMP, she admits to the use of tobacco products  no She claims she has gained   no pounds since the start of her pregnancy. She has lost weight. There are cats in the home in the home  yes  She admits close contact with children on a regular basis  yes  She has had chicken pox in the past yes She has had Tuberculosis exposures, symptoms, or previously tested positive for TB   no Current or past history of domestic violence. no  Genetic Screening/Teratology Counseling: (Includes patient, baby's father, or anyone in either family with:)   1. Patient's age >/= 57 at [facility]  no 2. Thalassemia ([place], [place], Mediterranean, or Asian background): MCV<80   no 3. Neural tube defect (meningomyelocele, spina bifida, anencephaly)  no 4. Congenital heart defect  no  5. Down syndrome  no 6. Tay-Sachs (Jewish, [place]  no 7. Canavan's Disease  no 8. Sickle cell disease or trait (African)  no  9. Hemophilia or other blood disorders  no  10. Muscular dystrophy  no  11. Cystic fibrosis  no  12. Huntington's Chorea  no  13. Mental retardation/autism  no 14. Other inherited genetic or chromosomal disorder  no 15. Maternal metabolic disorder (DM, PKU, etc)  no 16. Patient or FOB with a child with a birth defect not listed above no  16a. Patient or FOB with a birth defect themselves no 17. Recurrent pregnancy loss, or stillbirth  no  18. Any medications since LMP other than prenatal vitamins (include vitamins, supplements, OTC meds, drugs, alcohol)  no 19. Any other genetic/environmental exposure to discuss  no  Infection History:   1. Lives with someone with TB or TB exposed  no  2. Patient or partner has history of genital herpes  no 3. Rash or viral illness since LMP  no 4. History of STI (GC, CT, HPV, syphilis, HIV)  no 5. History of recent travel :  no  Other pertinent information:  yes . Hx of 2 NSVDx, one Cesarean section with last baby (10 lbs) Hx of shoulder dystocia with second baby. She has also been initially tested for HTN  and Diabetes at a PCP office, but is now pregnant. I have ordered a HGB A1C for next visit. MFM referral to assist with POC for the pregnancy. Dating scan ordered. Discussed the risk factors for this pregnancy with this patient. Recommended breastfeeding, which she has not done with her other three babies.    Review of Systems:10 point review of systems negative unless otherwise noted in HPI  Past Medical History:  Past Medical History:  Diagnosis Date  . Anemia   . Anxiety and depression   . Blood transfusion without reported diagnosis   . Hx of preeclampsia, prior pregnancy, currently pregnant   .  Macrosomia   . Panic disorder   . Postpartum care following cesarean delivery 12/26/2016  . Shoulder dystocia, delivered     Past Surgical History:  Past Surgical History:  Procedure Laterality Date  . CESAREAN SECTION N/A 12/23/2016   Procedure: CESAREAN SECTION;  Surgeon: Vena Austria, MD;  Location: [facility] ORS;  Service: Obstetrics;  Laterality: N/A;  . WISDOM TOOTH EXTRACTION     four; 2018    Gynecologic History: Patient's last menstrual period was 04/13/2022 (exact date).  Obstetric History: [id number]  Family History:  Family History  Problem Relation Age of Onset  . Diabetes Mother   . Thyroid disease Mother   . Epilepsy Mother   . Hypertension Father   . Sickle cell anemia Sister   . Hypertension Brother   . Autism spectrum disorder Brother   . Hypertension Maternal Grandmother   . Diabetes Maternal Grandfather   . Diabetes Paternal Grandmother   . Other Paternal Grandmother        problem c kidneys and liver  . Hypertension Paternal Grandfather     Social History:  Social History   Socioeconomic History  . Marital status: Single    Spouse name: Not on file  . Number of children: 3  . Years of education: 13.5  . Highest education level: Not on file  Occupational History  . Occupation: Lawyer  Tobacco Use  . Smoking status: Never  . Smokeless tobacco: Never  Vaping Use  . Vaping Use: Never used  Substance and Sexual Activity  . Alcohol use: No    Alcohol/week: 0.0 standard drinks of alcohol  . Drug use: No  . Sexual activity: Yes    Partners: Male    Birth control/protection: None  Other Topics Concern  . Not on file  Social History Narrative  . Not on file   Social Determinants of Health   Financial Resource Strain: Medium Risk (05/21/2022)   Overall Financial Resource Strain (CARDIA)   . Difficulty of Paying Living Expenses: Somewhat hard  Food Insecurity: No Food Insecurity (05/21/2022)   Hunger Vital Sign   . Worried About  Programme researcher, broadcasting/film/video in the Last Year: Never true   . Ran Out of Food in the Last Year: Never true  Transportation Needs: No Transportation Needs (05/21/2022)   PRAPARE - Transportation   . Lack of Transportation (Medical): No   . Lack of Transportation (Non-Medical): No  Physical Activity: Insufficiently Active (05/21/2022)   Exercise Vital Sign   . Days of Exercise per Week: 4 days   . Minutes of Exercise per Session: 30 min  Stress: Stress Concern Present (05/21/2022)   [employer] of Occupational Health - Occupational Stress Questionnaire   . Feeling of Stress : To some extent  Social Connections: Unknown (05/21/2022)   Social Connection and Isolation Panel [NHANES]   .  Frequency of Communication with Friends and Family: More than three times a week   . Frequency of Social Gatherings with Friends and Family: Twice a week   . Attends Religious Services: More than 4 times per year   . Active Member of Clubs or Organizations: No   . Attends Banker Meetings: Never   . Marital Status: Not on file  Intimate Partner Violence: Not At Risk (05/21/2022)   Humiliation, Afraid, Rape, and Kick questionnaire   . Fear of Current or Ex-Partner: No   . Emotionally Abused: No   . Physically Abused: No   . Sexually Abused: No    Allergies:  Allergies  Allergen Reactions  . Albuterol Anaphylaxis  . Benadryl [Diphenhydramine] Anaphylaxis  . Buspar [Buspirone] Anaphylaxis  . Strawberry Extract Anaphylaxis    Medications: Prior to Admission medications   Medication Sig Start Date End Date Taking? Authorizing Provider  Prenatal Vit-Fe Fumarate-FA (MULTIVITAMIN-PRENATAL) 27-0.8 MG TABS tablet Take 1 tablet by mouth daily at 12 noon.   Yes [provider]  levalbuterol (XOPENEX HFA) 45 MCG/ACT inhaler Inhale 2 puffs into the lungs every 6 (six) hours as needed for wheezing. Patient not taking: Reported on 06/03/2022 07/22/21   Noel Journey    Physical  Exam Vitals: Blood pressure 130/80, weight 227 lb (103 kg), last menstrual period 04/13/2022.  General: NAD HEENT: normocephalic, anicteric Thyroid: no enlargement, no palpable nodules Pulmonary: No increased work of breathing, CTAB Cardiovascular: RRR, distal pulses 2+ Abdomen: NABS, soft, non-tender, non-distended.  Umbilicus without lesions.  No hepatomegaly, splenomegaly or masses palpable. No evidence of hernia  Genitourinary:  External: Normal external female genitalia.  Normal urethral meatus, normal  Bartholin's and Skene's glands.    Vagina: Normal vaginal mucosa, no evidence of prolapse.    Cervix: Grossly normal in appearance, no bleeding  Uterus: anteverted,  Non-enlarged, mobile, normal contour.  No CMT  Adnexa: ovaries non-enlarged, no adnexal masses  Rectal: deferred Extremities: no edema, erythema, or tenderness Neurologic: Grossly intact Psychiatric: mood appropriate, affect full   Assessment: 26 y.o. G4P3003 at [redacted]w[redacted]d presenting to initiate prenatal care  Plan: 1) Avoid alcoholic beverages. 2) Patient encouraged not to smoke.  3) Discontinue the use of all non-medicinal drugs and chemicals.  4) Take prenatal vitamins daily.  5) Nutrition, food safety (fish, cheese advisories, and high nitrite foods) and exercise discussed. 6) Hospital and practice style discussed with cross coverage system.  7) Genetic Screening, such as with 1st Trimester Screening, cell free fetal DNA, AFP testing, and Ultrasound, as well as with amniocentesis and CVS as appropriate, is discussed with patient. At the conclusion of today's visit patient requested genetic testing 8) Patient is asked about travel to areas at risk for the Zika virus, and counseled to avoid travel and exposure to mosquitoes or sexual partners who may have themselves been exposed to the virus. Testing is discussed, and will be ordered as appropriate.  The following were addressed during this visit:  Breastfeeding  Education - Early initiation of breastfeeding    Comments: Keeps milk supply adequate, helps contract uterus and slow bleeding, and early milk is the perfect first food and is easy to digest.   - The importance of exclusive breastfeeding    Comments: Provides antibodies, Lower risk of breast and ovarian cancers, and type-2 diabetes,Helps your body recover, Reduced chance of SIDS.   - Risks of giving your baby anything other than breast milk if you are breastfeeding    Comments: Make  the baby less content with breastfeeds, may make my baby more susceptible to illness, and may reduce my milk supply.   - Nonpharmacological pain relief methods for labor    Comments: Deep breathing, focusing on pleasant things, movement and walking, heating pads or cold compress, massage and relaxation, continuous support from someone you trust, and Doulas   - The importance of early skin-to-skin contact    Comments: Keeps baby warm and secure, helps keep baby's blood sugar up and breathing steady, easier to bond and breastfeed, and helps calm baby.  - Rooming-in on a 24-hour basis    Comments: Easier to learn baby's feeding cues, easier to bond and get to know each other, and encourages milk production.   - Feeding on demand or baby-led feeding    Comments: Helps prevent breastfeeding complications, helps bring in good milk supply, prevents under or overfeeding, and helps baby feel content and satisfied   - Frequent feeding to help assure optimal milk production    Comments: Making a full supply of milk requires frequent removal of milk from breasts, infant will eat 8-12 times in 24 hours, if separated from infant use breast massage, hand expression and/ or pumping to remove milk from breasts.   - Effective positioning and attachment    Comments: Helps my baby to get enough breast milk, helps to produce an adequate milk supply, and helps prevent nipple pain and damage   - Exclusive breastfeeding for  the first 6 months    Comments: Builds a healthy milk supply and keeps it up, protects baby from sickness and disease, and breastmilk has everything your baby needs for the first 6 months.  - Individualized Education    Comments: Contraindications to breastfeeding and other special medical conditions   F/ U in 3-4 weeks for ROB. Mirna Mires, CNM  06/03/2022 12:42 PM

## 2022-06-04 ENCOUNTER — Other Ambulatory Visit: Payer: Self-pay

## 2022-06-04 DIAGNOSIS — O10019 Pre-existing essential hypertension complicating pregnancy, unspecified trimester: Secondary | ICD-10-CM

## 2022-06-04 DIAGNOSIS — O0990 Supervision of high risk pregnancy, unspecified, unspecified trimester: Secondary | ICD-10-CM

## 2022-06-04 DIAGNOSIS — O24111 Pre-existing diabetes mellitus, type 2, in pregnancy, first trimester: Secondary | ICD-10-CM

## 2022-06-04 NOTE — Progress Notes (Signed)
Diabetic Counseling noted on Ambulatory MFM referral. Order placed for [facility] Diabetic Counseling. Order placed for MFM Ultrasound (required for all consult visits).

## 2022-06-08 ENCOUNTER — Encounter: Payer: Self-pay | Admitting: Obstetrics

## 2022-06-08 LAB — CERVICOVAGINAL ANCILLARY ONLY
Bacterial Vaginitis (gardnerella): NEGATIVE
Candida Glabrata: NEGATIVE
Candida Vaginitis: POSITIVE — AB
Chlamydia: NEGATIVE
Comment: NEGATIVE
Comment: NEGATIVE
Comment: NEGATIVE
Comment: NEGATIVE
Comment: NEGATIVE
Comment: NORMAL
Neisseria Gonorrhea: NEGATIVE
Trichomonas: NEGATIVE

## 2022-06-09 LAB — CYTOLOGY - PAP: Diagnosis: NEGATIVE

## 2022-06-09 NOTE — Telephone Encounter (Signed)
Pt was scheduled for her New OB Intake appt on 8/18, her NOB labs on 8/21, and her New OB appt with a provider on 8/31.

## 2022-06-24 ENCOUNTER — Ambulatory Visit (INDEPENDENT_AMBULATORY_CARE_PROVIDER_SITE_OTHER): Payer: Medicaid Other

## 2022-06-24 DIAGNOSIS — Z3480 Encounter for supervision of other normal pregnancy, unspecified trimester: Secondary | ICD-10-CM | POA: Diagnosis not present

## 2022-06-28 ENCOUNTER — Encounter: Payer: Self-pay | Admitting: Advanced Practice Midwife

## 2022-06-30 ENCOUNTER — Ambulatory Visit (INDEPENDENT_AMBULATORY_CARE_PROVIDER_SITE_OTHER): Payer: Medicaid Other | Admitting: Advanced Practice Midwife

## 2022-06-30 VITALS — BP 128/84 | Wt 226.0 lb

## 2022-06-30 DIAGNOSIS — O24111 Pre-existing diabetes mellitus, type 2, in pregnancy, first trimester: Secondary | ICD-10-CM

## 2022-06-30 DIAGNOSIS — O0991 Supervision of high risk pregnancy, unspecified, first trimester: Secondary | ICD-10-CM

## 2022-06-30 DIAGNOSIS — Z369 Encounter for antenatal screening, unspecified: Secondary | ICD-10-CM

## 2022-06-30 DIAGNOSIS — O0990 Supervision of high risk pregnancy, unspecified, unspecified trimester: Secondary | ICD-10-CM

## 2022-06-30 DIAGNOSIS — Z3A11 11 weeks gestation of pregnancy: Secondary | ICD-10-CM

## 2022-06-30 DIAGNOSIS — O09299 Supervision of pregnancy with other poor reproductive or obstetric history, unspecified trimester: Secondary | ICD-10-CM

## 2022-06-30 DIAGNOSIS — O219 Vomiting of pregnancy, unspecified: Secondary | ICD-10-CM

## 2022-06-30 MED ORDER — ONDANSETRON 4 MG PO TBDP: 4.0000 mg | ORAL_TABLET | Freq: Four times a day (QID) | ORAL | 2 refills | Status: DC | PRN

## 2022-06-30 NOTE — Progress Notes (Signed)
Routine Prenatal Care Visit  Subjective  Erika Solomon is a 26 y.o. [phone] at [redacted]w[redacted]d being seen today for ongoing prenatal care.  She is currently monitored for the following issues for this high-risk pregnancy and has Hx of preeclampsia, prior pregnancy, currently pregnant; Maternal varicella, non-immune; Iron deficiency anemia; and Supervision of high risk pregnancy, antepartum on their problem list.  ----------------------------------------------------------------------------------- Patient reports  still having some nausea and requesting refill of zofran .   Contractions: Not present. Vag. Bleeding: None.   . Leaking Fluid denies.  ----------------------------------------------------------------------------------- The following portions of the patient's history were reviewed and updated as appropriate: allergies, current medications, past family history, past medical history, past social history, past surgical history and problem list. Problem list updated.  Objective  Blood pressure 128/84, weight 226 lb (102.5 kg), last menstrual period 04/13/2022. Pregravid weight 230 lb (104.3 kg) Total Weight Gain -4 lb (-1.814 kg) Urinalysis: Urine Protein    Urine Glucose    Fetal Status:         attempted to and unable to hear doppler heart tones today  General:  Alert, oriented and cooperative. Patient is in no acute distress.  Skin: Skin is warm and dry. No rash noted.   Cardiovascular: Normal heart rate noted  Respiratory: Normal respiratory effort, no problems with respiration noted  Abdomen: Soft, gravid, appropriate for gestational age. Pain/Pressure: Absent     Pelvic:  Cervical exam deferred        Extremities: Normal range of motion.     Mental Status: Normal mood and affect. Normal behavior. Normal judgment and thought content.   Assessment   26 y.o. G2X5284 at [redacted]w[redacted]d by  01/18/2023, by Last Menstrual Period presenting for routine prenatal visit  Plan   four Problems (from  05/21/22 to present)     Problem Noted Resolved   Supervision of high risk pregnancy, antepartum 06/03/2022 by Imagene Riches, CNM No   Overview Addendum 06/09/2022 10:14 PM by Imagene Riches, CNM     Nursing Staff Provider  Office Location  [facility] Dating    Language  English Anatomy US    Flu Vaccine   Genetic Screen  NIPS:   TDaP vaccine    Hgb A1C or  GTT Early : Third trimester :   Covid    LAB RESULTS   Rhogam   Blood Type A/Positive/-- (08/21 1105)   Feeding Plan  Breast? Antibody Negative (08/21 1105)  Contraception  Rubella <0.90 (08/21 1105)  Circumcision  RPR Non Reactive (08/21 1105)   Pediatrician   HBsAg Negative (08/21 1105)   Support Person Lennette Bihari HIV Non Reactive (08/21 1105)  Prenatal Classes  Varicella     GBS  (For PCN allergy, check sensitivities)   BTL Consent     VBAC Consent  Pap  06/03/2022 NILM    Hgb Electro      CF      SMA                    Preterm labor symptoms and general obstetric precautions including but not limited to vaginal bleeding, contractions, leaking of fluid and fetal movement were reviewed in detail with the patient. Please refer to After Visit Summary for other counseling recommendations.   Return in about 2 weeks (around 07/14/2022) for rob for heart tones.  Nausea: Refill Rx zofran  Rod Can, CNM 06/30/2022 8:43 AM

## 2022-06-30 NOTE — Progress Notes (Signed)
No vb. No lof. Needs refill on nausea meds. Genetic testing today

## 2022-07-01 LAB — HGB A1C W/O EAG: Hgb A1c MFr Bld: 5.4 % (ref 4.8–5.6)

## 2022-07-02 ENCOUNTER — Encounter: Payer: Self-pay | Admitting: *Deleted

## 2022-07-02 ENCOUNTER — Encounter: Payer: Medicaid Other | Attending: Obstetrics | Admitting: *Deleted

## 2022-07-02 VITALS — BP 140/86 | Ht 66.0 in | Wt 228.6 lb

## 2022-07-02 DIAGNOSIS — O24410 Gestational diabetes mellitus in pregnancy, diet controlled: Secondary | ICD-10-CM

## 2022-07-02 DIAGNOSIS — Z713 Dietary counseling and surveillance: Secondary | ICD-10-CM | POA: Diagnosis not present

## 2022-07-02 DIAGNOSIS — O24111 Pre-existing diabetes mellitus, type 2, in pregnancy, first trimester: Secondary | ICD-10-CM | POA: Diagnosis not present

## 2022-07-02 NOTE — Patient Instructions (Signed)
Read booklet on Gestational Diabetes Follow Gestational Meal Planning Guidelines Don't skip meals - eat 1 protein and 1 carbohydrate serving Limit fruit at breakfast if blood sugars elevated Continue to avoid sugar sweetened drinks Complete a 3 Day Food Record and bring to next appointment Check blood sugars 4 x day - before breakfast and 2 hrs after every meal and record  Bring blood sugar log to all appointments Call MD for prescription for meter strips and lancets Strips   Accu-Chek Guide   Lancets   Accu-Chek Softclix Purchase urine ketone strips if instructed by MD and check urine ketones every am:  If + increase bedtime snack to 1 protein and 2 carbohydrate servings Walk 20-30 minutes at least 5 x week if permitted by MD

## 2022-07-02 NOTE — Progress Notes (Signed)
Diabetes Self-Management Education  Visit Type: First/Initial  Appt. Start Time: 1320 Appt. End Time: 1435  07/02/2022  Erika Solomon, identified by name and date of birth, is a 26 y.o. female with a diagnosis of Diabetes: Gestational Diabetes.   ASSESSMENT  Blood pressure (!) 140/86, height 5\' 6"  (1.676 m), weight 228 lb 9.6 oz (103.7 kg), last menstrual period 04/13/2022, 01/18/2023 Body mass index is 36.9 kg/m.   Diabetes Self-Management Education - 07/02/22 1511       Visit Information   Visit Type First/Initial      Initial Visit   Diabetes Type Gestational Diabetes    Date Diagnosed August 2023    Are you currently following a meal plan? Yes    What type of meal plan do you follow? "started drinking zero sugar, cutting sweets and snacks"    Are you taking your medications as prescribed? Yes      Health Coping   How would you rate your overall health? Fair      Psychosocial Assessment   Patient Belief/Attitude about Diabetes Other (comment)   "confused"   What is the hardest part about your diabetes right now, causing you the most concern, or is the most worrisome to you about your diabetes?   Making healty food and beverage choices    Self-care barriers None    Self-management support Doctor's office;Family    Patient Concerns Nutrition/Meal planning;Glycemic Control;Weight Control;Monitoring;Healthy Lifestyle    Special Needs None    Preferred Learning Style Auditory;Visual;Hands on    Learning Readiness Change in progress    How often do you need to have someone help you when you read instructions, pamphlets, or other written materials from your doctor or pharmacy? 1 - Never    What is the last grade level you completed in school? in college now      Pre-Education Assessment   Patient understands the diabetes disease and treatment process. Needs Instruction    Patient understands incorporating nutritional management into lifestyle. Needs Instruction     Patient undertands incorporating physical activity into lifestyle. Needs Instruction    Patient understands using medications safely. Needs Instruction    Patient understands monitoring blood glucose, interpreting and using results Needs Instruction    Patient understands prevention, detection, and treatment of acute complications. Needs Instruction    Patient understands prevention, detection, and treatment of chronic complications. Needs Instruction    Patient understands how to develop strategies to address psychosocial issues. Needs Instruction    Patient understands how to develop strategies to promote health/change behavior. Needs Instruction      Complications   Last HgB A1C per patient/outside source 5.4 %   06/30/2022   How often do you check your blood sugar? 0 times/day (not testing)   Pt has been using family member's meter and reports FBG's greater than 95 mg/dL and pp's 07/02/2022 mg/dL. She hasn't checked in 1-2 weeks. Accu-Chek Guide Me meter provided and instructed on use. BG in the office was 91 mg/dL at 785-885 pm - 3 hrs pp.   Have you had a dilated eye exam in the past 12 months? Yes    Have you had a dental exam in the past 12 months? No    Are you checking your feet? No      Dietary Intake   Breakfast usually skips or may eat fruit (grapes, apple, orange)    Snack (morning) 1-2 snacks/day - peanut butter crackers, granola    Lunch sometimes skips or  eats meat sandwich with chips, pepperoni and cheese, noodles    Dinner chicken, pork, potatoes, corn, beans, pasta, green beans, broccoli, squash, carrots, salads with lettuce, tomatoes, peppers, onions, cuccumbers    Beverage(s) water, diet soda      Activity / Exercise   Activity / Exercise Type Light (walking / raking leaves)    How many days per week do you exercise? 2.5    How many minutes per day do you exercise? 60    Total minutes per week of exercise 150      Patient Education   Previous Diabetes Education No     Disease Pathophysiology Definition of diabetes, type 1 and 2, and the diagnosis of diabetes;Factors that contribute to the development of diabetes    Healthy Eating Role of diet in the treatment of diabetes and the relationship between the three main macronutrients and blood glucose level;Food label reading, portion sizes and measuring food.;Reviewed blood glucose goals for pre and post meals and how to evaluate the patients' food intake on their blood glucose level.    Being Active Role of exercise on diabetes management, blood pressure control and cardiac health.    Medications Other (comment)   Limited use of oral medications during pregnancy and potential for insulin.   Monitoring Taught/evaluated SMBG meter.;Purpose and frequency of SMBG.;Taught/discussed recording of test results and interpretation of SMBG.;Identified appropriate SMBG and/or A1C goals.;Ketone testing, when, how.    Chronic complications Relationship between chronic complications and blood glucose control    Diabetes Stress and Support Identified and addressed patients feelings and concerns about diabetes    Preconception care Pregnancy and GDM  Role of pre-pregnancy blood glucose control on the development of the fetus;Reviewed with patient blood glucose goals with pregnancy;Role of family planning for patients with diabetes      Individualized Goals (developed by patient)   Reducing Risk Other (comment)   improve blood sugars, prevent diabetes complications, lose weight, lead a healthier lifestyle     Outcomes   Expected Outcomes Demonstrated interest in learning. Expect positive outcomes    Future DMSE Other (comment)   3 weeks        Individualized Plan for Diabetes Self-Management Training:   Learning Objective:  Patient will have a greater understanding of diabetes self-management. Patient education plan is to attend individual and/or group sessions per assessed needs and concerns.   Plan:   Patient Instructions   Read booklet on Gestational Diabetes Follow Gestational Meal Planning Guidelines Don't skip meals - eat 1 protein and 1 carbohydrate serving Limit fruit at breakfast if blood sugars elevated Continue to avoid sugar sweetened drinks Complete a 3 Day Food Record and bring to next appointment Check blood sugars 4 x day - before breakfast and 2 hrs after every meal and record  Bring blood sugar log to all appointments Call MD for prescription for meter strips and lancets Strips   Accu-Chek Guide   Lancets   Accu-Chek Softclix Purchase urine ketone strips if instructed by MD and check urine ketones every am:  If + increase bedtime snack to 1 protein and 2 carbohydrate servings Walk 20-30 minutes at least 5 x week if permitted by MD  Expected Outcomes:  Demonstrated interest in learning. Expect positive outcomes  Education material provided:  Gestational Booklet Gestational Meal Planning Guidelines Simple Meal Plan Meter = Acc-Chek Guide Me 3 Day Food Record Goals for a Healthy Pregnancy   If problems or questions, patient to contact team via:  Johny Drilling,  RN, CCM, CDCES [phone]  Future DSME appointment:  (3 weeks) July 23, 2022 with the dietitian

## 2022-07-05 ENCOUNTER — Encounter: Payer: Self-pay | Admitting: Obstetrics

## 2022-07-05 DIAGNOSIS — O24119 Pre-existing diabetes mellitus, type 2, in pregnancy, unspecified trimester: Secondary | ICD-10-CM | POA: Insufficient documentation

## 2022-07-05 DIAGNOSIS — O24419 Gestational diabetes mellitus in pregnancy, unspecified control: Secondary | ICD-10-CM | POA: Insufficient documentation

## 2022-07-05 LAB — MATERNIT21 PLUS CORE+SCA
Fetal Fraction: 5
Monosomy X (Turner Syndrome): NOT DETECTED
Result (T21): NEGATIVE
Trisomy 13 (Patau syndrome): NEGATIVE
Trisomy 18 (Edwards syndrome): NEGATIVE
Trisomy 21 (Down syndrome): NEGATIVE
XXX (Triple X Syndrome): NOT DETECTED
XXY (Klinefelter Syndrome): NOT DETECTED
XYY (Jacobs Syndrome): NOT DETECTED

## 2022-07-08 ENCOUNTER — Other Ambulatory Visit: Payer: Self-pay | Admitting: Obstetrics

## 2022-07-08 ENCOUNTER — Encounter: Payer: Self-pay | Admitting: Obstetrics

## 2022-07-09 ENCOUNTER — Other Ambulatory Visit: Payer: Self-pay | Admitting: Licensed Practical Nurse

## 2022-07-09 DIAGNOSIS — O24410 Gestational diabetes mellitus in pregnancy, diet controlled: Secondary | ICD-10-CM

## 2022-07-09 MED ORDER — ACCU-CHEK GUIDE VI STRP: 1.0000 | ORAL_STRIP | Freq: Four times a day (QID) | 6 refills | Status: DC

## 2022-07-12 ENCOUNTER — Ambulatory Visit (INDEPENDENT_AMBULATORY_CARE_PROVIDER_SITE_OTHER): Payer: Medicaid Other | Admitting: Certified Nurse Midwife

## 2022-07-12 ENCOUNTER — Encounter: Payer: Self-pay | Admitting: Certified Nurse Midwife

## 2022-07-12 ENCOUNTER — Other Ambulatory Visit: Payer: Self-pay | Admitting: Certified Nurse Midwife

## 2022-07-12 VITALS — BP 131/84 | HR 81 | Wt 226.9 lb

## 2022-07-12 DIAGNOSIS — Z3A12 12 weeks gestation of pregnancy: Secondary | ICD-10-CM

## 2022-07-12 MED ORDER — GLYBURIDE 5 MG PO TABS: 5.0000 mg | ORAL_TABLET | Freq: Two times a day (BID) | ORAL | 3 refills | Status: DC

## 2022-07-12 NOTE — Addendum Note (Signed)
Addended by: Hildred Priest on: 07/12/2022 03:01 PM   Modules accepted: Orders

## 2022-07-12 NOTE — Patient Instructions (Signed)
Oral Glucose Tolerance Test During Pregnancy Why am I having this test? The oral glucose tolerance test (OGTT) is done to check how your body processes blood sugar (glucose). This is one of several tests used to diagnose diabetes that develops during pregnancy (gestational diabetes mellitus). Gestational diabetes is a short-term form of diabetes that some women develop while they are pregnant. It usually occurs during the second trimester of pregnancy and goes away after delivery. Testing, or screening, for gestational diabetes usually occurs at weeks 24-28 of pregnancy. You may have the OGTT test after having a 1-hour glucose screening test if the results from that test indicate that you may have gestational diabetes. This test may also be needed if: You have a history of gestational diabetes. There is a history of giving birth to very large babies or of losing pregnancies (having stillbirths). You have signs and symptoms of diabetes, such as: Changes in your eyesight. Tingling or numbness in your hands or feet. Changes in hunger, thirst, and urination, and these are not explained by your pregnancy. What is being tested? This test measures the amount of glucose in your blood at different times during a period of 3 hours. This shows how well your body can process glucose. What kind of sample is taken?  Blood samples are required for this test. They are usually collected by inserting a needle into a blood vessel. How do I prepare for this test? For 3 days before your test, eat normally. Have plenty of carbohydrate-rich foods. Follow instructions from your health care provider about: Eating or drinking restrictions on the day of the test. You may be asked not to eat or drink anything other than water (to fast) starting 8-10 hours before the test. Changing or stopping your regular medicines. Some medicines may interfere with this test. Tell a health care provider about: All medicines you are  taking, including vitamins, herbs, eye drops, creams, and over-the-counter medicines. Any blood disorders you have. Any surgeries you have had. Any medical conditions you have. What happens during the test? First, your blood glucose will be measured. This is referred to as your fasting blood glucose because you fasted before the test. Then, you will drink a glucose solution that contains a certain amount of glucose. Your blood glucose will be measured again 1, 2, and 3 hours after you drink the solution. This test takes about 3 hours to complete. You will need to stay at the testing location during this time. During the testing period: Do not eat or drink anything other than the glucose solution. Do not exercise. Do not use any products that contain nicotine or tobacco, such as cigarettes, e-cigarettes, and chewing tobacco. These can affect your test results. If you need help quitting, ask your health care provider. The testing procedure may vary among health care providers and hospitals. How are the results reported? Your results will be reported as milligrams of glucose per deciliter of blood (mg/dL) or millimoles per liter (mmol/L). There is more than one source for screening and diagnosis reference values used to diagnose gestational diabetes. Your health care provider will compare your results to normal values that were established after testing a large group of people (reference values). Reference values may vary among labs and hospitals. For this test (Carpenter-Coustan), reference values are: Fasting: 95 mg/dL (5.3 mmol/L). 1 hour: 180 mg/dL (10.0 mmol/L). 2 hour: 155 mg/dL (8.6 mmol/L). 3 hour: 140 mg/dL (7.8 mmol/L). What do the results mean? Results below the reference values are   considered normal. If two or more of your blood glucose levels are at or above the reference values, you may be diagnosed with gestational diabetes. If only one level is high, your health care provider may  suggest repeat testing or other tests to confirm a diagnosis. Talk with your health care provider about what your results mean. Questions to ask your health care provider Ask your health care provider, or the department that is doing the test: When will my results be ready? How will I get my results? What are my treatment options? What other tests do I need? What are my next steps? Summary The oral glucose tolerance test (OGTT) is one of several tests used to diagnose diabetes that develops during pregnancy (gestational diabetes mellitus). Gestational diabetes is a short-term form of diabetes that some women develop while they are pregnant. You may have the OGTT test after having a 1-hour glucose screening test if the results from that test show that you may have gestational diabetes. You may also have this test if you have any symptoms or risk factors for this type of diabetes. Talk with your health care provider about what your results mean. This information is not intended to replace advice given to you by your health care provider. Make sure you discuss any questions you have with your health care provider. Document Revised: 02/28/2020 Document Reviewed: 02/28/2020 Elsevier Patient Education  2023 [employer] Inc.  

## 2022-07-12 NOTE — Progress Notes (Signed)
ROB for heart tone, was not able to auscultate at last visit , likely due to maternal obesity. Pt state she has seen [facility] and presents with log.   Fasting levels 101-124 2 hr pp 1 level in range 110  Most are 140's , 3 values 190's   Dr. Marcelline Mates and Amalia Hailey not in the office today for consult. Message sent for request of recommendations for medication management. Will follow up with patient in regards to recommendations.   ROB 4 wks .   Philip Aspen, CNM

## 2022-07-12 NOTE — Progress Notes (Signed)
Consulted Dr Marcelline Mates regarding blood sugar values. Start on glyburide 5 mg BID. Pt to follow up 1 wk for evaluation of BS log .   Philip Aspen, CNM

## 2022-07-15 ENCOUNTER — Telehealth: Payer: Self-pay | Admitting: *Deleted

## 2022-07-15 ENCOUNTER — Ambulatory Visit: Payer: Medicaid Other | Admitting: Dietician

## 2022-07-15 NOTE — Telephone Encounter (Signed)
Received voice mail from patient. She has been prescribed oral medication for blood sugars and had questions. Phone call to pt. She has been prescribed Glyburide 5 mg 2 x day. Pt reports she has read about the side effects and safety of medications during pregnancy. Explained that there are 2 oral medications used during pregnancy (Glyburide and Metformin) and if this doesn't help blood sugars she may have to take insulin. Instructed her to take before breakfast and supper. Discussed side effects of hypoglycemia including symptoms and proper treatments. Instructed her to keep glucose tablets or peppermints and a snack with her at all times. She reports all blood sugars are above range  >95 fasting and >120 post meal. She has follow up appointment with MD and our dietitian next Friday. Provided our main number and dietitian's name to contact for questions since this educator will be on vacation.

## 2022-07-23 ENCOUNTER — Encounter: Payer: Medicaid Other | Attending: Obstetrics | Admitting: Dietician

## 2022-07-23 ENCOUNTER — Encounter: Payer: Self-pay | Admitting: Advanced Practice Midwife

## 2022-07-23 ENCOUNTER — Ambulatory Visit (INDEPENDENT_AMBULATORY_CARE_PROVIDER_SITE_OTHER): Payer: Medicaid Other | Admitting: Advanced Practice Midwife

## 2022-07-23 ENCOUNTER — Encounter: Payer: Self-pay | Admitting: Dietician

## 2022-07-23 VITALS — BP 110/72 | Ht 66.0 in | Wt 227.2 lb

## 2022-07-23 VITALS — BP 149/78 | Wt 228.0 lb

## 2022-07-23 DIAGNOSIS — O10012 Pre-existing essential hypertension complicating pregnancy, second trimester: Secondary | ICD-10-CM

## 2022-07-23 DIAGNOSIS — O24414 Gestational diabetes mellitus in pregnancy, insulin controlled: Secondary | ICD-10-CM | POA: Diagnosis present

## 2022-07-23 DIAGNOSIS — O24112 Pre-existing diabetes mellitus, type 2, in pregnancy, second trimester: Secondary | ICD-10-CM

## 2022-07-23 DIAGNOSIS — Z3A14 14 weeks gestation of pregnancy: Secondary | ICD-10-CM

## 2022-07-23 DIAGNOSIS — O99210 Obesity complicating pregnancy, unspecified trimester: Secondary | ICD-10-CM | POA: Insufficient documentation

## 2022-07-23 DIAGNOSIS — O10919 Unspecified pre-existing hypertension complicating pregnancy, unspecified trimester: Secondary | ICD-10-CM | POA: Insufficient documentation

## 2022-07-23 DIAGNOSIS — O0992 Supervision of high risk pregnancy, unspecified, second trimester: Secondary | ICD-10-CM | POA: Insufficient documentation

## 2022-07-23 DIAGNOSIS — Z3A16 16 weeks gestation of pregnancy: Secondary | ICD-10-CM | POA: Diagnosis not present

## 2022-07-23 DIAGNOSIS — O10913 Unspecified pre-existing hypertension complicating pregnancy, third trimester: Secondary | ICD-10-CM | POA: Insufficient documentation

## 2022-07-23 DIAGNOSIS — O24111 Pre-existing diabetes mellitus, type 2, in pregnancy, first trimester: Secondary | ICD-10-CM

## 2022-07-23 DIAGNOSIS — Z713 Dietary counseling and surveillance: Secondary | ICD-10-CM | POA: Insufficient documentation

## 2022-07-23 DIAGNOSIS — E119 Type 2 diabetes mellitus without complications: Secondary | ICD-10-CM | POA: Diagnosis not present

## 2022-07-23 DIAGNOSIS — D509 Iron deficiency anemia, unspecified: Secondary | ICD-10-CM

## 2022-07-23 DIAGNOSIS — O99012 Anemia complicating pregnancy, second trimester: Secondary | ICD-10-CM

## 2022-07-23 DIAGNOSIS — O24415 Gestational diabetes mellitus in pregnancy, controlled by oral hypoglycemic drugs: Secondary | ICD-10-CM

## 2022-07-23 LAB — GLUCOSE, POCT (MANUAL RESULT ENTRY): POC Glucose: 157 mg/dl — AB (ref 70–99)

## 2022-07-23 MED ORDER — LABETALOL HCL 100 MG PO TABS: 100.0000 mg | ORAL_TABLET | Freq: Two times a day (BID) | ORAL | 6 refills | Status: DC

## 2022-07-23 MED ORDER — GLYBURIDE 2.5 MG PO TABS: 7.5000 mg | ORAL_TABLET | Freq: Two times a day (BID) | ORAL | 3 refills | Status: DC

## 2022-07-23 NOTE — Progress Notes (Signed)
Routine Prenatal Care Visit  Subjective  Erika Solomon is a 26 y.o. [phone] at [redacted]w[redacted]d being seen today for ongoing prenatal care.  She is currently monitored for the following issues for this high-risk pregnancy and has Hx of preeclampsia, prior pregnancy, currently pregnant; Maternal varicella, non-immune; Iron deficiency anemia; Supervision of high risk pregnancy, antepartum; Pre-existing type 2 diabetes mellitus during pregnancy; and Chronic hypertension affecting pregnancy on their problem list.  ----------------------------------------------------------------------------------- Patient reports headache for the past 2 days. She likely has chronic hypertension although she is not taking medication. She has been to [facility] today for diabetes education. Her BS values are primarily elevated. Discussed with Dr Marcelline Mates who recommended increasing Glyburide to 7.5 mg BID for 1 week and then to 10 mg BID if no improvement after 1 week. She will then have f/u visit with MD for further management of diabetes.   Contractions: Not present. Vag. Bleeding: None.  Movement: Absent. Leaking Fluid denies.  ----------------------------------------------------------------------------------- The following portions of the patient's history were reviewed and updated as appropriate: allergies, current medications, past family history, past medical history, past social history, past surgical history and problem list. Problem list updated.  Objective  Blood pressure (!) 149/78, weight 228 lb (103.4 kg), last menstrual period 04/13/2022. Pregravid weight 230 lb (104.3 kg) Total Weight Gain -2 lb (-0.907 kg) Urinalysis: Urine Protein    Urine Glucose     BG log: since 07/12/22  Fasting all elevated between 100-128 Post prandial: all elevated 120s-200 except for a few normal after breakfast and a couple normal after lunch   Fetal Status: Fetal Heart Rate (bpm): 149   Movement: Absent     General:  Alert, oriented and  cooperative. Patient is in no acute distress.  Skin: Skin is warm and dry. No rash noted.   Cardiovascular: Normal heart rate noted  Respiratory: Normal respiratory effort, no problems with respiration noted  Abdomen: Soft, gravid, appropriate for gestational age. Pain/Pressure: Absent     Pelvic:  Cervical exam deferred        Extremities: Normal range of motion.  Edema: None  Mental Status: Normal mood and affect. Normal behavior. Normal judgment and thought content.   Assessment   26 y.o. [id number] at [redacted]w[redacted]d by  01/18/2023, by Last Menstrual Period presenting for routine prenatal visit  Plan   four Problems (from 05/21/22 to present)    Problem Noted Resolved   Chronic hypertension affecting pregnancy 07/23/2022 by Rod Can, CNM No   Pre-existing type 2 diabetes mellitus during pregnancy 07/05/2022 by Imagene Riches, CNM No   Overview Signed 07/05/2022 11:48 AM by Imagene Riches, CNM    elevated 1hr GTT- sent to [facility].06/2022      Supervision of high risk pregnancy, antepartum 06/03/2022 by Imagene Riches, CNM No   Overview Addendum 07/02/2022  1:05 PM by Imagene Riches, CNM     Nursing Staff Provider  Office Location  [facility] Dating    Language  English Anatomy US    Flu Vaccine   Genetic Screen  NIPS:   TDaP vaccine    Hgb A1C or  GTT Early :5.4 Third trimester :   Covid    LAB RESULTS   Rhogam   Blood Type A/Positive/-- (08/21 1105)   Feeding Plan  Breast? Antibody Negative (08/21 1105)  Contraception  Rubella <0.90 (08/21 1105)  Circumcision  RPR Non Reactive (08/21 1105)   Pediatrician   HBsAg Negative (08/21 1105)   Support Person Lennette Bihari HIV Non  Reactive (08/21 1105)  Prenatal Classes  Varicella     GBS  (For PCN allergy, check sensitivities)   BTL Consent     VBAC Consent  Pap  06/03/2022 NILM    Hgb Electro      CF      SMA                   Preterm labor symptoms and general obstetric precautions including but not limited to vaginal  bleeding, contractions, leaking of fluid and fetal movement were reviewed in detail with the patient. Please refer to After Visit Summary for other counseling recommendations.   Return in about 2 weeks (around 08/06/2022) for MD appointment ROB/review BS log and medication.  Rod Can, CNM 07/23/2022 5:09 PM

## 2022-07-23 NOTE — Progress Notes (Signed)
Patient's BG record indicates fasting BGs ranging 101-128, and post-meal BGs ranging 101-191. She is taking 5mg  Glyburide twice daily without noticing any improvement in BGs Discussed likelihood of insulin as treatment; patient voices anxiety over giving herself injections and states she will try to get her spouse to help administer injections. Her children are helping with fingerpricks for BG testing.  She reports some episodes of headache, clamminess, weakness, but BG still high when testing. Patient's food diary indicates some skipped meals, some small meals, limited variety of foods due to nausea. Advised controlling carb intake to appropriate amounts and including protein source as often as possible. Discussed suitable options for carbs and protein.  Provided basic balanced meal plan, and wrote individualized menus based on patient's food preferences. Instructed patient on food safety, including avoidance of Listeriosis. Patient does not eat fish, does not consume excess caffeine. Discussed importance of maintaining healthy lifestyle habits to reduce risk of Type 2 DM as well as Gestational DM with any future pregnancies (patient states she is not planning on any future pregnancies). Advised patient to use any remaining testing supplies to test some BGs after delivery, and to have BG tested ideally annually, as well as prior to attempting future pregnancies.

## 2022-07-23 NOTE — Patient Instructions (Signed)
Keep working to eat a meal or snack every 3-5 hours during the day, using "safest" foods. Continue to control carb intake to 30-45 grams with any one meal, less for snacks. Include a protein food along with carb as much as possible. Low sugar (<10g) protein drink is OK. If you have symptoms of low blood sugar, check if able, and if <70, take 4oz juice or regular soda or other sugar containing drink, allow 10-15 minutes for improvement, and then follow with a meal or snack. If no improvement recheck BG and repeat 4oz juice, then meal or snack.

## 2022-08-05 ENCOUNTER — Encounter: Payer: Self-pay | Admitting: Obstetrics and Gynecology

## 2022-08-05 ENCOUNTER — Ambulatory Visit (INDEPENDENT_AMBULATORY_CARE_PROVIDER_SITE_OTHER): Payer: Medicaid Other | Admitting: Obstetrics and Gynecology

## 2022-08-05 VITALS — BP 144/88 | HR 102 | Wt 227.6 lb

## 2022-08-05 DIAGNOSIS — O10912 Unspecified pre-existing hypertension complicating pregnancy, second trimester: Secondary | ICD-10-CM

## 2022-08-05 DIAGNOSIS — Z1379 Encounter for other screening for genetic and chromosomal anomalies: Secondary | ICD-10-CM

## 2022-08-05 DIAGNOSIS — R82998 Other abnormal findings in urine: Secondary | ICD-10-CM

## 2022-08-05 DIAGNOSIS — Z3A16 16 weeks gestation of pregnancy: Secondary | ICD-10-CM

## 2022-08-05 DIAGNOSIS — Z794 Long term (current) use of insulin: Secondary | ICD-10-CM

## 2022-08-05 DIAGNOSIS — O24012 Pre-existing diabetes mellitus, type 1, in pregnancy, second trimester: Secondary | ICD-10-CM

## 2022-08-05 DIAGNOSIS — O10919 Unspecified pre-existing hypertension complicating pregnancy, unspecified trimester: Secondary | ICD-10-CM

## 2022-08-05 DIAGNOSIS — O0992 Supervision of high risk pregnancy, unspecified, second trimester: Secondary | ICD-10-CM

## 2022-08-05 LAB — POCT URINALYSIS DIPSTICK OB
Bilirubin, UA: NEGATIVE
Clarity, UA: NEGATIVE
Glucose, UA: NEGATIVE
Ketones, UA: NEGATIVE
Nitrite, UA: NEGATIVE
Spec Grav, UA: 1.015 (ref 1.010–1.025)
Urobilinogen, UA: 0.2 E.U./dL
pH, UA: 6.5 (ref 5.0–8.0)

## 2022-08-05 MED ORDER — INSULIN NPH (HUMAN) (ISOPHANE) 100 UNIT/ML ~~LOC~~ SUSP: SUBCUTANEOUS | 3 refills | Status: DC

## 2022-08-05 MED ORDER — INSULIN REGULAR HUMAN 100 UNIT/ML IJ SOLN: 10.0000 [IU] | Freq: Three times a day (TID) | INTRAMUSCULAR | 11 refills | Status: DC

## 2022-08-05 MED ORDER — INSULIN GLARGINE 100 UNIT/ML ~~LOC~~ SOLN: SUBCUTANEOUS | 3 refills | Status: DC

## 2022-08-05 MED ORDER — "INSULIN SYRINGE-NEEDLE U-100 25G X 1"" 1 ML MISC": 1 refills | Status: DC

## 2022-08-05 MED ORDER — INSULIN GLARGINE 100 UNIT/ML ~~LOC~~ SOLN: 30.0000 [IU] | Freq: Every day | SUBCUTANEOUS | 3 refills | Status: DC

## 2022-08-05 NOTE — Progress Notes (Signed)
Erika Solomon. Patient states having a headache and feeling dizzy today. She has brought her sugar log, currently taking Glyburide 10mg  bid. AFP ordered. Patient states no questions or concerns at this time.

## 2022-08-05 NOTE — Progress Notes (Signed)
ROB. Patient states  Patient states no questions or concerns at this time.   

## 2022-08-05 NOTE — Progress Notes (Signed)
ROB: She has a number of issues to discuss today.  She recently was diagnosed with monilia and has tried Monistat.  This seemed to work for short time but now she has itching and burning again.  She declined Nuswab today-she will try Monistat again.  Her sugars are not well controlled on 10 mg twice daily of glyburide.  Will discontinue glyburide and switch to Lantus and regular insulin to cover meals.  She is doing a good job keeping track of her sugars 4 times a day.  [facility] use of [facility].  Blood pressure not well controlled on current regimen.  Will increase to 200 mg labetalol twice daily.  Urine shows blood, possibility of UTI.  Cultures sent.  Patient desires AFP today.

## 2022-08-06 ENCOUNTER — Encounter: Payer: Self-pay | Admitting: Obstetrics and Gynecology

## 2022-08-08 LAB — URINE CULTURE

## 2022-08-09 ENCOUNTER — Encounter: Payer: Medicaid Other | Admitting: Advanced Practice Midwife

## 2022-08-09 ENCOUNTER — Other Ambulatory Visit: Payer: Medicaid Other

## 2022-08-10 LAB — AFP, SERUM, OPEN SPINA BIFIDA
AFP MoM: 0.59
AFP Value: 12.3 ng/mL
Gest. Age on Collection Date: 16 weeks
Maternal Age At EDD: 28.4 yr
OSBR Risk 1 IN: 10000
Test Results:: NEGATIVE
Weight: 228 [lb_av]

## 2022-08-10 NOTE — Telephone Encounter (Signed)
Spoke with pharmacy. Lantus should be in stock by tomorrow and syringes are on backorder. Pharmacy is going to switch syringes to one they have in stock. Patient made aware.

## 2022-08-10 NOTE — Telephone Encounter (Signed)
Called pharmacy, closed for lunch and reopens at 2.

## 2022-08-10 NOTE — Telephone Encounter (Signed)
Erika Solomon, I have spoken with the pharmacy. They are going to adjust the needles to a size that will work and that they have in [place], they should be ready in 30 minutes or so. Medication wise, the Lantus is being ordered and expected to be at the pharmacy by 5 pm tomorrow.   [name]

## 2022-08-12 ENCOUNTER — Encounter: Payer: Self-pay | Admitting: *Deleted

## 2022-08-12 ENCOUNTER — Encounter: Payer: Medicaid Other | Attending: Obstetrics | Admitting: *Deleted

## 2022-08-12 VITALS — BP 122/72 | Wt 226.2 lb

## 2022-08-12 DIAGNOSIS — O24414 Gestational diabetes mellitus in pregnancy, insulin controlled: Secondary | ICD-10-CM | POA: Diagnosis not present

## 2022-08-12 NOTE — Patient Instructions (Signed)
Follow Gestational Meal Planning Guidelines Check blood sugars 4 x day - before breakfast and 2 hrs after every meal and record  Bring blood sugar log to MD appointments  Walk 20-30 minutes at least 5 x week if permitted by MD  Carry fast acting glucose and a snack at all times Rotate injection sites Give 10 units Regular insulin 30 minutes before meals Give 30 units Lantus insulin at bedtime

## 2022-08-12 NOTE — Progress Notes (Signed)
Diabetes Self-Management Education  Visit Type:  Follow-up  Appt. Start Time: 0810 Appt. End Time: 0850  08/12/2022  [name], identified by name and date of birth, is a 26 y.o. female with a diagnosis of Diabetes: Gestational Diabetes .    ASSESSMENT  Blood pressure 122/72, weight 226 lb 3.2 oz (102.6 kg), last menstrual period 04/13/2022, estimated date of delivery 01/18/2023 Body mass index is 36.51 kg/m.    Diabetes Self-Management Education - 08/12/22 0900       Complications   How often do you check your blood sugar? 3-4 times/day    Fasting Blood glucose range (mg/dL) 32-440   She didn't bring records but reports FBG today was 115 mg/dL. Most FBG's are above 95 mg/dL.   Postprandial Blood glucose range (mg/dL) 10-272;536-644   She reports pp last night was 131 mg/dL and most are above 034 mg/dL.   Have you had a dilated eye exam in the past 12 months? Yes    Have you had a dental exam in the past 12 months? No    Are you checking your feet? No      Dietary Intake   Breakfast 1-2 meals and 1 snack/day      Activity / Exercise   Activity / Exercise Type ADL's      Patient Education   Healthy Eating Food label reading, portion sizes and measuring food.;Reviewed blood glucose goals for pre and post meals and how to evaluate the patients' food intake on their blood glucose level.;Meal timing in regards to the patients' current diabetes medication.    Being Active Role of exercise on diabetes management, blood pressure control and cardiac health.    Medications Taught/reviewed insulin/injectables, injection, site rotation, insulin/injectables storage and needle disposal.;Reviewed patients medication for diabetes, action, purpose, timing of dose and side effects.    Monitoring Purpose and frequency of SMBG.;Taught/discussed recording of test results and interpretation of SMBG.;Identified appropriate SMBG and/or A1C goals.    Acute complications Taught prevention,  symptoms, and  treatment of hypoglycemia - the 15 rule.    Diabetes Stress and Support Role of stress on diabetes    Preconception care Pregnancy and GDM  Role of pre-pregnancy blood glucose control on the development of the fetus;Reviewed with patient blood glucose goals with pregnancy;Role of family planning for patients with diabetes      Individualized Goals (developed by patient)   Nutrition Follow meal plan discussed    Physical Activity 30 minutes per day;Exercise 3-5 times per week    Medications take my medication as prescribed    Monitoring  Test my blood glucose as discussed    Problem Solving Eating Pattern    Reducing Risk examine blood glucose patterns;treat hypoglycemia with 15 grams of carbs if blood glucose less than 70mg /dL      Post-Education Assessment   Patient understands incorporating nutritional management into lifestyle. Needs Review    Patient undertands incorporating physical activity into lifestyle. Comprehends key points    Patient understands using medications safely. Demonstrates understanding / competency    Patient understands monitoring blood glucose, interpreting and using results Demonstrates understanding / competency    Patient understands prevention, detection, and treatment of acute complications. Demonstrates understanding / competency    Patient understands how to develop strategies to address psychosocial issues. Demonstrates understanding / competency    Patient understands how to develop strategies to promote health/change behavior. Demonstrates understanding / competency      Outcomes   Program Status Completed  Subsequent Visit   Since your last visit have you continued or begun to take your medications as prescribed? Yes    Since your last visit have you had your blood pressure checked? Yes    Is your most recent blood pressure lower, unchanged, or higher since your last visit? Lower    Since your last visit have you experienced any  weight changes? Loss    Weight Loss (lbs) 1    Since your last visit, are you checking your blood glucose at least once a day? Yes         Learning Objective:  Patient will have a greater understanding of diabetes self-management. Patient education plan is to attend individual and/or group sessions per assessed needs and concerns.  Plan:   Patient Instructions  Follow Gestational Meal Planning Guidelines Check blood sugars 4 x day - before breakfast and 2 hrs after every meal and record  Bring blood sugar log to MD appointments  Walk 20-30 minutes at least 5 x week if permitted by MD  Carry fast acting glucose and a snack at all times Rotate injection sites Give 10 units Regular insulin 30 minutes before meals Give 30 units Lantus insulin at bedtime  Expected Outcomes:  Demonstrated interest in learning. Expect positive outcomes  Education material provided:  Injection Guide (BD) Glucose tablets Symptoms, causes and treatments of Hypoglycemia  If problems or questions, patient to contact team via:   Sharion Settler, RN, CCM, CDCES [phone]  Future DSME appointment: PRN

## 2022-08-13 ENCOUNTER — Telehealth: Payer: Self-pay | Admitting: *Deleted

## 2022-08-13 NOTE — Telephone Encounter (Signed)
Phone call to follow up with insulin administration. Left message for patient to call back.

## 2022-08-16 ENCOUNTER — Telehealth: Payer: Self-pay | Admitting: *Deleted

## 2022-08-16 NOTE — Telephone Encounter (Signed)
Phone call to patient to follow up with insulin administration. Left voice mail to call back for any questions.

## 2022-08-24 ENCOUNTER — Ambulatory Visit (INDEPENDENT_AMBULATORY_CARE_PROVIDER_SITE_OTHER): Payer: Medicaid Other

## 2022-08-24 ENCOUNTER — Ambulatory Visit (INDEPENDENT_AMBULATORY_CARE_PROVIDER_SITE_OTHER): Payer: Medicaid Other | Admitting: Obstetrics and Gynecology

## 2022-08-24 VITALS — BP 135/85 | HR 104 | Wt 229.0 lb

## 2022-08-24 DIAGNOSIS — R319 Hematuria, unspecified: Secondary | ICD-10-CM

## 2022-08-24 DIAGNOSIS — O99891 Other specified diseases and conditions complicating pregnancy: Secondary | ICD-10-CM

## 2022-08-24 DIAGNOSIS — E669 Obesity, unspecified: Secondary | ICD-10-CM

## 2022-08-24 DIAGNOSIS — Z3A16 16 weeks gestation of pregnancy: Secondary | ICD-10-CM

## 2022-08-24 DIAGNOSIS — Z3A19 19 weeks gestation of pregnancy: Secondary | ICD-10-CM

## 2022-08-24 DIAGNOSIS — E119 Type 2 diabetes mellitus without complications: Secondary | ICD-10-CM

## 2022-08-24 DIAGNOSIS — Z362 Encounter for other antenatal screening follow-up: Secondary | ICD-10-CM

## 2022-08-24 DIAGNOSIS — O34219 Maternal care for unspecified type scar from previous cesarean delivery: Secondary | ICD-10-CM | POA: Insufficient documentation

## 2022-08-24 DIAGNOSIS — O0990 Supervision of high risk pregnancy, unspecified, unspecified trimester: Secondary | ICD-10-CM

## 2022-08-24 DIAGNOSIS — O219 Vomiting of pregnancy, unspecified: Secondary | ICD-10-CM

## 2022-08-24 DIAGNOSIS — O10012 Pre-existing essential hypertension complicating pregnancy, second trimester: Secondary | ICD-10-CM

## 2022-08-24 DIAGNOSIS — Z8759 Personal history of other complications of pregnancy, childbirth and the puerperium: Secondary | ICD-10-CM | POA: Insufficient documentation

## 2022-08-24 DIAGNOSIS — O0992 Supervision of high risk pregnancy, unspecified, second trimester: Secondary | ICD-10-CM

## 2022-08-24 DIAGNOSIS — O10919 Unspecified pre-existing hypertension complicating pregnancy, unspecified trimester: Secondary | ICD-10-CM

## 2022-08-24 DIAGNOSIS — O24111 Pre-existing diabetes mellitus, type 2, in pregnancy, first trimester: Secondary | ICD-10-CM

## 2022-08-24 DIAGNOSIS — Z3A11 11 weeks gestation of pregnancy: Secondary | ICD-10-CM

## 2022-08-24 DIAGNOSIS — O99212 Obesity complicating pregnancy, second trimester: Secondary | ICD-10-CM

## 2022-08-24 DIAGNOSIS — Z794 Long term (current) use of insulin: Secondary | ICD-10-CM

## 2022-08-24 DIAGNOSIS — O09299 Supervision of pregnancy with other poor reproductive or obstetric history, unspecified trimester: Secondary | ICD-10-CM | POA: Insufficient documentation

## 2022-08-24 LAB — POCT URINALYSIS DIPSTICK OB
Bilirubin, UA: NEGATIVE
Glucose, UA: NEGATIVE
Ketones, UA: NEGATIVE
Nitrite, UA: NEGATIVE
Spec Grav, UA: 1.025 (ref 1.010–1.025)
Urobilinogen, UA: 0.2 E.U./dL
pH, UA: 6.5 (ref 5.0–8.0)

## 2022-08-24 MED ORDER — FLUCONAZOLE 150 MG PO TABS: 150.0000 mg | ORAL_TABLET | Freq: Once | ORAL | 2 refills | Status: DC

## 2022-08-24 MED ORDER — ONDANSETRON 4 MG PO TBDP: 4.0000 mg | ORAL_TABLET | Freq: Four times a day (QID) | ORAL | 2 refills | Status: DC | PRN

## 2022-08-24 NOTE — Progress Notes (Signed)
ROB 19.0: She is doing well today. She has no new concerns. She was positive for yeast on 05/24/22, notes trying Will continue current regimen at this time.  . U/A positive for leukocytes and blood. Urine culture obtained.

## 2022-08-24 NOTE — Progress Notes (Signed)
ROB: Doing well, no issues. S/p anatomy scan today, normal.  She was positive for yeast on 05/24/22,and notes that she has been told to try OTC treatments but these have not worked. Prescribed Diflucan. Discussed likely due to uncontrolled DM, however now improved on insulin.  Blood sugars reviewed, fastings wnl, occasional postprandial (1 elevation each of lunch and dinner 130s-140s). UA positive for leukocytes and blood, also noted last visit with negative culture. Desires refill on nausea medication. Discussed breastfeeding.  Advised on need for serial growth scans and antenatal testing in 3rd trimester.  Needs baseline PIH labs in next 1-2 visits due to h/o pre-eclampsia.  Discussed method of delivery based on fetal size and other maternal factors, can determine later in 3rd trimester.  H/o prior C/S x 1, infant 10 lbs, prior to this had 9 lb infant vaginally with shoulder dystocia. RTC in 2 weeks.

## 2022-08-27 LAB — URINE CULTURE

## 2022-09-08 ENCOUNTER — Encounter: Payer: Medicaid Other | Admitting: Licensed Practical Nurse

## 2022-09-09 ENCOUNTER — Ambulatory Visit (INDEPENDENT_AMBULATORY_CARE_PROVIDER_SITE_OTHER): Payer: Medicaid Other | Admitting: Obstetrics and Gynecology

## 2022-09-09 VITALS — BP 151/89 | HR 94 | Wt 232.0 lb

## 2022-09-09 DIAGNOSIS — O0992 Supervision of high risk pregnancy, unspecified, second trimester: Secondary | ICD-10-CM

## 2022-09-09 DIAGNOSIS — N3001 Acute cystitis with hematuria: Secondary | ICD-10-CM

## 2022-09-09 DIAGNOSIS — O24111 Pre-existing diabetes mellitus, type 2, in pregnancy, first trimester: Secondary | ICD-10-CM

## 2022-09-09 DIAGNOSIS — Z3A21 21 weeks gestation of pregnancy: Secondary | ICD-10-CM

## 2022-09-09 DIAGNOSIS — O09299 Supervision of pregnancy with other poor reproductive or obstetric history, unspecified trimester: Secondary | ICD-10-CM

## 2022-09-09 DIAGNOSIS — O99212 Obesity complicating pregnancy, second trimester: Secondary | ICD-10-CM

## 2022-09-09 LAB — POCT URINALYSIS DIPSTICK OB
Bilirubin, UA: NEGATIVE
Blood, UA: POSITIVE
Glucose, UA: NEGATIVE
Ketones, UA: NEGATIVE
Nitrite, UA: NEGATIVE
POC,PROTEIN,UA: NEGATIVE
Spec Grav, UA: 1.02 (ref 1.010–1.025)
Urobilinogen, UA: 0.2 E.U./dL
pH, UA: 7 (ref 5.0–8.0)

## 2022-09-09 MED ORDER — NITROFURANTOIN MONOHYD MACRO 100 MG PO CAPS: 100.0000 mg | ORAL_CAPSULE | Freq: Two times a day (BID) | ORAL | 0 refills | Status: DC

## 2022-09-09 NOTE — Progress Notes (Addendum)
ROB. Patient states she just started feeling movement. She is checking her sugars when she is supposed to. No LOF. No VB. She had a high BP reading yesterday. 143/90. Taking Labetalol twice daily. Checking her BP when she does not feel well. Normal anatomy 11/21. Patients urine shows blood and + leuks. UC ordered. Pt does not c/o any urinary symptoms at this time. Patient states no other questions or concerns at this time.

## 2022-09-09 NOTE — Progress Notes (Signed)
ROB: Patient having some urinary pressure symptoms.  UA appears consistent with UTI.  Macrobid prescription given.  Sugar log reviewed patient's fastings are elevated.  Increase Lantus to 35 at night.  MFM appointment scheduled for fetal echo.  Patient has elevated blood pressure despite labetalol.  I have asked her to keep a log at least twice a day of her blood pressures at home.  Consider increasing labetalol to 200 twice daily.

## 2022-09-09 NOTE — Addendum Note (Signed)
Addended by: Georgiana Shore R on: 09/09/2022 01:57 PM   Modules accepted: Orders

## 2022-09-10 ENCOUNTER — Ambulatory Visit: Payer: Medicaid Other | Admitting: *Deleted

## 2022-09-11 LAB — URINE CULTURE

## 2022-09-14 ENCOUNTER — Other Ambulatory Visit: Payer: Self-pay

## 2022-09-14 DIAGNOSIS — O0992 Supervision of high risk pregnancy, unspecified, second trimester: Secondary | ICD-10-CM

## 2022-09-14 DIAGNOSIS — O09299 Supervision of pregnancy with other poor reproductive or obstetric history, unspecified trimester: Secondary | ICD-10-CM

## 2022-09-14 DIAGNOSIS — O24111 Pre-existing diabetes mellitus, type 2, in pregnancy, first trimester: Secondary | ICD-10-CM

## 2022-09-14 DIAGNOSIS — O99212 Obesity complicating pregnancy, second trimester: Secondary | ICD-10-CM

## 2022-09-14 NOTE — Progress Notes (Signed)
Detailed U/S required for MFM Referral.

## 2022-09-21 ENCOUNTER — Other Ambulatory Visit: Payer: Self-pay

## 2022-09-22 ENCOUNTER — Ambulatory Visit (INDEPENDENT_AMBULATORY_CARE_PROVIDER_SITE_OTHER): Payer: Medicaid Other | Admitting: Obstetrics and Gynecology

## 2022-09-22 ENCOUNTER — Encounter: Payer: Self-pay | Admitting: Obstetrics and Gynecology

## 2022-09-22 VITALS — BP 122/81 | HR 94 | Wt 234.0 lb

## 2022-09-22 DIAGNOSIS — O09299 Supervision of pregnancy with other poor reproductive or obstetric history, unspecified trimester: Secondary | ICD-10-CM

## 2022-09-22 DIAGNOSIS — Z3A23 23 weeks gestation of pregnancy: Secondary | ICD-10-CM

## 2022-09-22 DIAGNOSIS — O99212 Obesity complicating pregnancy, second trimester: Secondary | ICD-10-CM

## 2022-09-22 DIAGNOSIS — O34219 Maternal care for unspecified type scar from previous cesarean delivery: Secondary | ICD-10-CM

## 2022-09-22 DIAGNOSIS — O0992 Supervision of high risk pregnancy, unspecified, second trimester: Secondary | ICD-10-CM

## 2022-09-22 DIAGNOSIS — O24112 Pre-existing diabetes mellitus, type 2, in pregnancy, second trimester: Secondary | ICD-10-CM

## 2022-09-22 LAB — POCT URINALYSIS DIPSTICK OB
Bilirubin, UA: NEGATIVE
Blood, UA: NEGATIVE
Glucose, UA: NEGATIVE
Ketones, UA: NEGATIVE
Leukocytes, UA: NEGATIVE
Nitrite, UA: NEGATIVE
POC,PROTEIN,UA: NEGATIVE
Spec Grav, UA: 1.02 (ref 1.010–1.025)
Urobilinogen, UA: 0.2 E.U./dL
pH, UA: 6 (ref 5.0–8.0)

## 2022-09-22 MED ORDER — METOCLOPRAMIDE HCL 10 MG PO TABS: 10.0000 mg | ORAL_TABLET | Freq: Four times a day (QID) | ORAL | 2 refills | Status: DC | PRN

## 2022-09-22 MED ORDER — LABETALOL HCL 200 MG PO TABS: 200.0000 mg | ORAL_TABLET | Freq: Two times a day (BID) | ORAL | 6 refills | Status: DC

## 2022-09-22 NOTE — Progress Notes (Signed)
ROB: Patient is a 26 y.o. [id number] female at [redacted]w[redacted]d with pre-existing diabetes in pregnancy (dx this pregnancy), chronic hypertension.  Patient currently using insulin for glucose management.  Sugars proved with increasing dose of Lantus to 35 units for fasting.  Patient did have 1 day over the past week where her postprandial lunch and dinners were significantly elevated.  Patient cannot remember what she ate that day.  However all other values have been within range since increasing medication.  Blood pressure today normal however patient also brought blood pressure log over the past week most range between 130s-150s /70s-110s.  It was discussed last visit that if her blood pressures were still elevated to increase to 200 mg.  I advised at this time that she should begin taking 200 mg twice daily.  Also patient has questions about daily baby aspirin which was recommended to her last visit, answered all questions, and encourage patient to initiate.  She is still noting some nausea that is only occasionally relieved with Zofran.  Would like to discuss other options.  I discussed option of Reglan.  Will prescribe and see if this helps more.  Lastly noting issues with possible tailbone pain.  I discussed comfort measures.  Patient has an appointment scheduled for tomorrow for fetal echo and MFM. Overall normal recent anatomy scan. RTC in 2-3 weeks.

## 2022-09-22 NOTE — Progress Notes (Signed)
ROB. Patient states daily fetal movement with lower back pain. She brought her sugar log and blood pressure readings from home today. Reports still feeling nauseated and that zofran only seems to work some of the time, would like to discuss other options. Patient states no questions or concerns at this time.

## 2022-09-23 ENCOUNTER — Ambulatory Visit: Payer: Medicaid Other | Attending: Obstetrics

## 2022-09-23 ENCOUNTER — Ambulatory Visit (HOSPITAL_BASED_OUTPATIENT_CLINIC_OR_DEPARTMENT_OTHER): Payer: Medicaid Other | Admitting: Obstetrics

## 2022-09-23 ENCOUNTER — Other Ambulatory Visit: Payer: Self-pay

## 2022-09-23 DIAGNOSIS — O34219 Maternal care for unspecified type scar from previous cesarean delivery: Secondary | ICD-10-CM | POA: Diagnosis not present

## 2022-09-23 DIAGNOSIS — O3662X0 Maternal care for excessive fetal growth, second trimester, not applicable or unspecified: Secondary | ICD-10-CM | POA: Diagnosis not present

## 2022-09-23 DIAGNOSIS — O10912 Unspecified pre-existing hypertension complicating pregnancy, second trimester: Secondary | ICD-10-CM | POA: Diagnosis not present

## 2022-09-23 DIAGNOSIS — Z363 Encounter for antenatal screening for malformations: Secondary | ICD-10-CM | POA: Diagnosis not present

## 2022-09-23 DIAGNOSIS — E669 Obesity, unspecified: Secondary | ICD-10-CM

## 2022-09-23 DIAGNOSIS — Z794 Long term (current) use of insulin: Secondary | ICD-10-CM

## 2022-09-23 DIAGNOSIS — O10012 Pre-existing essential hypertension complicating pregnancy, second trimester: Secondary | ICD-10-CM | POA: Diagnosis not present

## 2022-09-23 DIAGNOSIS — O24112 Pre-existing diabetes mellitus, type 2, in pregnancy, second trimester: Secondary | ICD-10-CM | POA: Diagnosis present

## 2022-09-23 DIAGNOSIS — O09292 Supervision of pregnancy with other poor reproductive or obstetric history, second trimester: Secondary | ICD-10-CM | POA: Insufficient documentation

## 2022-09-23 DIAGNOSIS — Z3A23 23 weeks gestation of pregnancy: Secondary | ICD-10-CM

## 2022-09-23 DIAGNOSIS — O24111 Pre-existing diabetes mellitus, type 2, in pregnancy, first trimester: Secondary | ICD-10-CM

## 2022-09-23 DIAGNOSIS — O99212 Obesity complicating pregnancy, second trimester: Secondary | ICD-10-CM | POA: Diagnosis not present

## 2022-09-23 DIAGNOSIS — O09299 Supervision of pregnancy with other poor reproductive or obstetric history, unspecified trimester: Secondary | ICD-10-CM

## 2022-09-23 DIAGNOSIS — O0992 Supervision of high risk pregnancy, unspecified, second trimester: Secondary | ICD-10-CM

## 2022-09-23 NOTE — Progress Notes (Signed)
MFM Note  Erika Solomon was seen for a detailed fetal anatomy scan due to maternal obesity with a BMI of 38.    Her pregnancy has also been complicated by probable pregestational diabetes that is treated with Lantus and Humalog insulin and chronic hypertension treated with labetalol 200 mg twice a day.  Her hemoglobin A1c drawn 2 months ago was 5.4%.  Her first child was delivered at term weighing 6 pounds.    Her second child was delivered at term weighing 9 pounds. Shoulder dystocia was encountered during that delivery.    Her third child was delivered at term via C-section weighing 10 pounds 4 ounces.  She had a cell free DNA test earlier in her pregnancy which indicated a low risk for trisomy 31, 65, and 13. A female fetus is predicted.   She had a fetal echocardiogram performed with [facility] today that showed a possible small VSD.  Dr. Renie Ora recommended a repeat echocardiogram after birth to determine if the VSD is still present.  She was informed that the fetal growth and amniotic fluid level were appropriate for her gestational age.   There were no obvious fetal anomalies noted on today's ultrasound exam.  However, today's exam was limited due to the fetal position and maternal body habitus.  The patient was informed that anomalies may be missed due to technical limitations. If the fetus is in a suboptimal position or maternal habitus is increased, visualization of the fetus in the maternal uterus may be impaired.  The following were discussed during today's consultation:  Pregestational diabetes and pregnancy  The implications and management of diabetes in pregnancy was discussed in detail with the patient.    She was advised to continue to monitor her fingersticks 4 times daily (fasting and 2 hours after each meal).    She was advised that our goals for her fingerstick values are fasting values of 90-95 or less and two-hour postprandial values of 120 or  less.    Should the majority of her fingerstick results be above these values, her insulin dose may have to be adjusted to help her achieve better glycemic control.   The patient was advised that getting her fingerstick values as close to these goals as possible would provide her with the most optimal obstetrical outcome.  The increased risk of polyhydramnios, fetal macrosomia, and preeclampsia associated with diabetes was also discussed.      Chronic hypertension in pregnancy  The implications and management of chronic hypertension in pregnancy was discussed.    She was advised to continue taking labetalol to maintain a blood pressure of 140/90 or less for the duration of her pregnancy.    Her dosage of labetalol may need to be increased should her blood pressures be persistently greater than 140/90 later in her pregnancy.    The increased risk of superimposed preeclampsia, an indicated preterm delivery, and possible fetal growth restriction due to chronic hypertension in pregnancy was discussed.   To decrease her risk of superimposed preeclampsia, she should start taking a daily baby aspirin (81 mg daily) for preeclampsia prophylaxis.   Due to diabetes and chronic hypertension in pregnancy, we will continue to follow her with monthly growth ultrasounds.    Weekly fetal testing using either NSTs or BPP's should be started at around 32 weeks.  Delivery will be recommended by 39 weeks should her blood pressures and glycemic control remain within normal limits.    Delivery will be recommended at around 37  weeks should her glycemic control or blood pressure control be poor.  Due to her underlying medical conditions, a follow-up exam was scheduled in 4 weeks to assess the fetal growth and to complete the views of the fetal anatomy.  The patient stated that all of her questions have been answered to her satisfaction.    A total of 45 minutes was spent counseling and coordinating the care  for this patient.  Greater than 50% of the time was spent in direct face-to-face contact.

## 2022-10-01 ENCOUNTER — Other Ambulatory Visit: Payer: Self-pay

## 2022-10-01 ENCOUNTER — Encounter: Payer: Self-pay | Admitting: Obstetrics and Gynecology

## 2022-10-01 DIAGNOSIS — O24112 Pre-existing diabetes mellitus, type 2, in pregnancy, second trimester: Secondary | ICD-10-CM

## 2022-10-01 MED ORDER — "INSULIN SYRINGE-NEEDLE U-100 25G X 1"" 1 ML MISC": 1 refills | Status: DC

## 2022-10-12 ENCOUNTER — Encounter: Payer: Self-pay | Admitting: Obstetrics and Gynecology

## 2022-10-12 ENCOUNTER — Other Ambulatory Visit: Payer: Self-pay

## 2022-10-12 ENCOUNTER — Ambulatory Visit (INDEPENDENT_AMBULATORY_CARE_PROVIDER_SITE_OTHER): Payer: Medicaid Other | Admitting: Obstetrics and Gynecology

## 2022-10-12 VITALS — BP 127/85 | HR 103 | Wt 232.3 lb

## 2022-10-12 DIAGNOSIS — O99212 Obesity complicating pregnancy, second trimester: Secondary | ICD-10-CM

## 2022-10-12 DIAGNOSIS — Z23 Encounter for immunization: Secondary | ICD-10-CM | POA: Diagnosis not present

## 2022-10-12 DIAGNOSIS — Z794 Long term (current) use of insulin: Secondary | ICD-10-CM

## 2022-10-12 DIAGNOSIS — O10012 Pre-existing essential hypertension complicating pregnancy, second trimester: Secondary | ICD-10-CM

## 2022-10-12 DIAGNOSIS — O34219 Maternal care for unspecified type scar from previous cesarean delivery: Secondary | ICD-10-CM

## 2022-10-12 DIAGNOSIS — Z3A26 26 weeks gestation of pregnancy: Secondary | ICD-10-CM

## 2022-10-12 DIAGNOSIS — O24112 Pre-existing diabetes mellitus, type 2, in pregnancy, second trimester: Secondary | ICD-10-CM

## 2022-10-12 DIAGNOSIS — O09299 Supervision of pregnancy with other poor reproductive or obstetric history, unspecified trimester: Secondary | ICD-10-CM

## 2022-10-12 DIAGNOSIS — E119 Type 2 diabetes mellitus without complications: Secondary | ICD-10-CM

## 2022-10-12 DIAGNOSIS — O0992 Supervision of high risk pregnancy, unspecified, second trimester: Secondary | ICD-10-CM

## 2022-10-12 LAB — POCT URINALYSIS DIPSTICK OB
Bilirubin, UA: NEGATIVE
Glucose, UA: NEGATIVE
Ketones, UA: NEGATIVE
Nitrite, UA: NEGATIVE
Spec Grav, UA: 1.025 (ref 1.010–1.025)
Urobilinogen, UA: 0.2 E.U./dL
pH, UA: 5 (ref 5.0–8.0)

## 2022-10-12 MED ORDER — "INSULIN SYRINGE-NEEDLE U-100 31G X 5/16"" 0.5 ML MISC": 1 refills | Status: DC

## 2022-10-12 NOTE — Progress Notes (Signed)
ROB: Patient has chronic hypertension and diabetes.  She is currently taking labetalol twice daily.  It is mostly controlling her blood pressures.  She has occasional high ones at home but most are normal.  No plans to change labetalol at this time.  She is keeping track of her sugars but not well.  She is very good about fastings but 2-hour postprandials are lacking.  I have increased her Lantus to 40 at night and maintained her normal Novolin with meals.  I have asked her to be a little bit more directed and keeping her daily blood sugars so that we can adjust insulin accordingly.  She has an MFM appointment next week presumably with ultrasound.  Complaints today of some back pain-discussed use of belly band.  Also complains of increased vaginal discharge and pelvic pressure.  She has blood in her urine so I have sent it for culture.

## 2022-10-13 ENCOUNTER — Encounter: Payer: Self-pay | Admitting: Obstetrics and Gynecology

## 2022-10-15 LAB — URINE CULTURE

## 2022-10-19 ENCOUNTER — Other Ambulatory Visit: Payer: Self-pay

## 2022-10-19 DIAGNOSIS — O24112 Pre-existing diabetes mellitus, type 2, in pregnancy, second trimester: Secondary | ICD-10-CM

## 2022-10-19 DIAGNOSIS — O09293 Supervision of pregnancy with other poor reproductive or obstetric history, third trimester: Secondary | ICD-10-CM

## 2022-10-19 DIAGNOSIS — Z98891 History of uterine scar from previous surgery: Secondary | ICD-10-CM

## 2022-10-19 DIAGNOSIS — O99213 Obesity complicating pregnancy, third trimester: Secondary | ICD-10-CM

## 2022-10-21 ENCOUNTER — Other Ambulatory Visit: Payer: Self-pay

## 2022-10-21 ENCOUNTER — Ambulatory Visit: Payer: Medicaid Other | Attending: Maternal & Fetal Medicine

## 2022-10-21 VITALS — BP 129/79 | HR 89 | Temp 98.0°F | Ht 64.0 in | Wt 233.5 lb

## 2022-10-21 DIAGNOSIS — Z794 Long term (current) use of insulin: Secondary | ICD-10-CM

## 2022-10-21 DIAGNOSIS — Z362 Encounter for other antenatal screening follow-up: Secondary | ICD-10-CM

## 2022-10-21 DIAGNOSIS — O34219 Maternal care for unspecified type scar from previous cesarean delivery: Secondary | ICD-10-CM

## 2022-10-21 DIAGNOSIS — Z98891 History of uterine scar from previous surgery: Secondary | ICD-10-CM

## 2022-10-21 DIAGNOSIS — O24112 Pre-existing diabetes mellitus, type 2, in pregnancy, second trimester: Secondary | ICD-10-CM | POA: Diagnosis present

## 2022-10-21 DIAGNOSIS — Z3A27 27 weeks gestation of pregnancy: Secondary | ICD-10-CM | POA: Insufficient documentation

## 2022-10-21 DIAGNOSIS — E119 Type 2 diabetes mellitus without complications: Secondary | ICD-10-CM | POA: Diagnosis not present

## 2022-10-21 DIAGNOSIS — E669 Obesity, unspecified: Secondary | ICD-10-CM

## 2022-10-21 DIAGNOSIS — O0990 Supervision of high risk pregnancy, unspecified, unspecified trimester: Secondary | ICD-10-CM

## 2022-10-21 DIAGNOSIS — O10912 Unspecified pre-existing hypertension complicating pregnancy, second trimester: Secondary | ICD-10-CM | POA: Diagnosis not present

## 2022-10-21 DIAGNOSIS — O10012 Pre-existing essential hypertension complicating pregnancy, second trimester: Secondary | ICD-10-CM | POA: Diagnosis not present

## 2022-10-21 DIAGNOSIS — O09292 Supervision of pregnancy with other poor reproductive or obstetric history, second trimester: Secondary | ICD-10-CM | POA: Diagnosis not present

## 2022-10-21 DIAGNOSIS — O99212 Obesity complicating pregnancy, second trimester: Secondary | ICD-10-CM | POA: Insufficient documentation

## 2022-10-21 DIAGNOSIS — O10919 Unspecified pre-existing hypertension complicating pregnancy, unspecified trimester: Secondary | ICD-10-CM

## 2022-10-21 DIAGNOSIS — O99213 Obesity complicating pregnancy, third trimester: Secondary | ICD-10-CM

## 2022-10-21 DIAGNOSIS — O09293 Supervision of pregnancy with other poor reproductive or obstetric history, third trimester: Secondary | ICD-10-CM

## 2022-11-09 ENCOUNTER — Ambulatory Visit (INDEPENDENT_AMBULATORY_CARE_PROVIDER_SITE_OTHER): Payer: Medicaid Other | Admitting: Obstetrics and Gynecology

## 2022-11-09 ENCOUNTER — Encounter: Payer: Self-pay | Admitting: Obstetrics and Gynecology

## 2022-11-09 VITALS — BP 139/84 | HR 97 | Wt 232.6 lb

## 2022-11-09 DIAGNOSIS — E119 Type 2 diabetes mellitus without complications: Secondary | ICD-10-CM

## 2022-11-09 DIAGNOSIS — Z794 Long term (current) use of insulin: Secondary | ICD-10-CM

## 2022-11-09 DIAGNOSIS — O0990 Supervision of high risk pregnancy, unspecified, unspecified trimester: Secondary | ICD-10-CM

## 2022-11-09 DIAGNOSIS — Z3A30 30 weeks gestation of pregnancy: Secondary | ICD-10-CM

## 2022-11-09 DIAGNOSIS — O0992 Supervision of high risk pregnancy, unspecified, second trimester: Secondary | ICD-10-CM

## 2022-11-09 DIAGNOSIS — O24113 Pre-existing diabetes mellitus, type 2, in pregnancy, third trimester: Secondary | ICD-10-CM

## 2022-11-09 LAB — POCT URINALYSIS DIPSTICK OB
Bilirubin, UA: NEGATIVE
Glucose, UA: NEGATIVE
Ketones, UA: NEGATIVE
Nitrite, UA: NEGATIVE
Spec Grav, UA: 1.02 (ref 1.010–1.025)
Urobilinogen, UA: 0.2 E.U./dL
pH, UA: 6.5 (ref 5.0–8.0)

## 2022-11-09 NOTE — Progress Notes (Signed)
ROB [redacted]w[redacted]d: She is doing well, she has good fetal movement. She has been having some left side pain x 2 days. This morning in the shower, she felt like she was going to pass out. She has an uncomfortable feeling in her abdomen, when baby is in a certain position. She also has noticed some extreme hot flashes.

## 2022-11-09 NOTE — Progress Notes (Signed)
ROB: Patient is a 27 y.o. [id number] at [id number] who presents for routine OB Care. Pregnancy complicated by type II DM (on insulin), h/o pre-eclampsia, h/o shoulder dystocia, h/o CS for macrosomia (10 lbs).  Also with possible small VSD noted on fetal anatomy scan (although MFM US performed 12/21 with no findings). Questionable h/o cHTN.  Patient notes feeling "off today".  Felt she was going to pass out in the shower. Checked blood sugars, was 109 (just prior to breakfast) and 150 ~ 1 hour later after eating.  Ate a pop tart and cheese stick.  Discussed improving dietary regimen (not taking in many veggies). Also feeling some hot flushes, and and "uncomfortable feeling" in her lower stomach and left side, mildly tender to touch. Denies nausea/vomiting. May be secondary to fetal positioning and movement.   Is scheduled for next growth scan in 2 weeks. FH slightly elevated today at 33 cm. To begin antenatal testing at 32 weeks (currently scheduled with MFM, informed she could also perform some testing in OB office with NSTs). Still is undecided on TOLAC vs RCS.  Plans to wait until closer to 36 weeks when last growth scan is scheduled. Notes she is nervious about either decision, especially if she has to have her baby early. Addressed concerns with patient.  28 week labs ordered today (not performed last visit). BP borderline today. Continue to monitor. RTC in 2 weeks.

## 2022-11-10 ENCOUNTER — Other Ambulatory Visit: Payer: Self-pay | Admitting: Obstetrics and Gynecology

## 2022-11-10 ENCOUNTER — Encounter: Payer: Self-pay | Admitting: Obstetrics and Gynecology

## 2022-11-10 DIAGNOSIS — O99013 Anemia complicating pregnancy, third trimester: Secondary | ICD-10-CM

## 2022-11-10 LAB — CBC
Hematocrit: 28.8 % — ABNORMAL LOW (ref 34.0–46.6)
Hemoglobin: 9.3 g/dL — ABNORMAL LOW (ref 11.1–15.9)
MCH: 27.4 pg (ref 26.6–33.0)
MCHC: 32.3 g/dL (ref 31.5–35.7)
MCV: 85 fL (ref 79–97)
Platelets: 220 10*3/uL (ref 150–450)
RBC: 3.39 x10E6/uL — ABNORMAL LOW (ref 3.77–5.28)
RDW: 14.9 % (ref 11.7–15.4)
WBC: 9 10*3/uL (ref 3.4–10.8)

## 2022-11-10 LAB — RPR: RPR Ser Ql: NONREACTIVE

## 2022-11-10 MED ORDER — ACCRUFER 30 MG PO CAPS: 1.0000 | ORAL_CAPSULE | Freq: Two times a day (BID) | ORAL | 3 refills | Status: DC

## 2022-11-16 ENCOUNTER — Ambulatory Visit
Admission: RE | Admit: 2022-11-16 | Discharge: 2022-11-16 | Disposition: A | Discharge status: Home / Self Care | Payer: Medicaid Other | Source: Ambulatory Visit | Attending: Obstetrics and Gynecology | Admitting: Obstetrics and Gynecology

## 2022-11-16 DIAGNOSIS — O99013 Anemia complicating pregnancy, third trimester: Secondary | ICD-10-CM | POA: Insufficient documentation

## 2022-11-16 DIAGNOSIS — D509 Iron deficiency anemia, unspecified: Secondary | ICD-10-CM | POA: Insufficient documentation

## 2022-11-16 DIAGNOSIS — Z3A00 Weeks of gestation of pregnancy not specified: Secondary | ICD-10-CM | POA: Insufficient documentation

## 2022-11-16 MED ORDER — SODIUM CHLORIDE 0.9 % IV SOLN
200.0000 mg | Freq: Once | INTRAVENOUS | Status: AC
  Administered 2022-11-16: 200 mg via INTRAVENOUS
  Filled 2022-11-16: qty 200

## 2022-11-17 ENCOUNTER — Telehealth: Payer: Medicaid Other | Admitting: Physician Assistant

## 2022-11-17 ENCOUNTER — Other Ambulatory Visit: Payer: Self-pay | Admitting: Maternal & Fetal Medicine

## 2022-11-17 ENCOUNTER — Encounter: Payer: Self-pay | Admitting: Physician Assistant

## 2022-11-17 DIAGNOSIS — R6889 Other general symptoms and signs: Secondary | ICD-10-CM

## 2022-11-17 DIAGNOSIS — O163 Unspecified maternal hypertension, third trimester: Secondary | ICD-10-CM

## 2022-11-17 MED ORDER — COVID-19 AT HOME ANTIGEN TEST VI KIT: PACK | 0 refills | Status: DC

## 2022-11-17 MED ORDER — OSELTAMIVIR PHOSPHATE 75 MG PO CAPS: 75.0000 mg | ORAL_CAPSULE | Freq: Two times a day (BID) | ORAL | 0 refills | Status: DC

## 2022-11-17 NOTE — Addendum Note (Signed)
Addended by: Brunetta Jeans on: 11/17/2022 01:54 PM   Modules accepted: Orders

## 2022-11-17 NOTE — Patient Instructions (Signed)
Elon Spanner, thank you for joining Leeanne Rio, PA-C for today's virtual visit.  While this provider is not your primary care provider (PCP), if your PCP is located in our provider database this encounter information will be shared with them immediately following your visit.   [facility] account gives you access to today's visit and all your visits, tests, and labs performed at [facility] " click here if you don't have a [facility] account or go to [email or web address]  Consent: (Patient) Erika Solomon provided verbal consent for this virtual visit at the beginning of the encounter.  Current Medications:  Current Outpatient Medications:    aspirin 81 MG chewable tablet, Chew 81 mg by mouth daily., Disp: , Rfl:    Ferric Maltol (ACCRUFER) 30 MG CAPS, Take 1 capsule (30 mg total) by mouth 2 (two) times daily., Disp: 60 capsule, Rfl: 3   glucose blood (ACCU-CHEK GUIDE) test strip, 1 each by Other route in the morning, at noon, in the evening, and at bedtime. Check blood glucose four times a day: fasting and 2 hours after breakfast, lunch, and dinner, Disp: 100 each, Rfl: 6   insulin glargine (LANTUS) 100 UNIT/ML injection, Inject 0.3 mLs (30 Units total) into the skin daily before breakfast. (Patient taking differently: Inject 40 Units into the skin at bedtime.), Disp: 10 mL, Rfl: 3   insulin regular (HUMULIN R) 100 units/mL injection, Inject 0.1 mLs (10 Units total) into the skin 3 (three) times daily before meals., Disp: 10 mL, Rfl: 11   Insulin Syringe-Needle U-100 31G X 5/16" 0.5 ML MISC, Use as directed., Disp: 100 each, Rfl: 1   labetalol (NORMODYNE) 200 MG tablet, Take 1 tablet (200 mg total) by mouth 2 (two) times daily., Disp: 60 tablet, Rfl: 6   metoCLOPramide (REGLAN) 10 MG tablet, Take 1 tablet (10 mg total) by mouth 4 (four) times daily as needed for nausea or vomiting. (Patient taking differently: Take 10 mg by mouth as needed for  nausea or vomiting.), Disp: 30 tablet, Rfl: 2   ondansetron (ZOFRAN-ODT) 4 MG disintegrating tablet, Take 1 tablet (4 mg total) by mouth every 6 (six) hours as needed for nausea., Disp: 20 tablet, Rfl: 2   Prenatal Vit-Fe Fumarate-FA (MULTIVITAMIN-PRENATAL) 27-0.8 MG TABS tablet, Take 1 tablet by mouth daily at 12 noon., Disp: , Rfl:    Medications ordered in this encounter:  No orders of the defined types were placed in this encounter.    *If you need refills on other medications prior to your next appointment, please contact your pharmacy*  Follow-Up: Call back or seek an in-person evaluation if the symptoms worsen or if the condition fails to improve as anticipated.  [facility] [phone]  Other Instructions Please keep hydrated and rest. Make sure to take the COVID test and message me with results ASAP so we can make further determinations in your treatment plan. You can use OTC Mucinex-DM and start an OTC Claritin.  Start a saline nasal rinse to flush out nasal passages. Tylenol for body aches.   Common Medications Safe in Pregnancy  Acne:      Constipation:  Benzoyl Peroxide     Colace  Clindamycin      Dulcolax Suppository  Topica Erythromycin     Fibercon  Salicylic Acid      Metamucil         Miralax AVOID:        Senakot   Accutane  Cough:  Retin-A       Cough Drops  Tetracycline      Phenergan w/ Codeine if Rx  Minocycline      Robitussin (Plain & DM)  Antibiotics:     Crabs/Lice:  Ceclor       RID  Cephalosporins    AVOID:  E-Mycins      Kwell  Keflex  Macrobid/Macrodantin   Diarrhea:  Penicillin      Kao-Pectate  Zithromax      Imodium AD         PUSH FLUIDS AVOID:       Cipro     Fever:  Tetracycline      Tylenol (Regular or Extra  Minocycline       Strength)  Levaquin      Extra Strength-Do not          Exceed 8 tabs/24 hrs Caffeine:        <249m/day (equiv. To 1 cup of coffee or  approx. 3 12 oz  sodas)         Gas: Cold/Hayfever:       Gas-X  Benadryl      Mylicon  Claritin       Phazyme  **Claritin-D        Chlor-Trimeton    Headaches:  Dimetapp      ASA-Free Excedrin  Drixoral-Non-Drowsy     Cold Compress  Mucinex (Guaifenasin)     Tylenol (Regular or Extra  Sudafed/Sudafed-12 Hour     Strength)  **Sudafed PE Pseudoephedrine   Tylenol Cold & Sinus     Vicks Vapor Rub  Zyrtec  **AVOID if Problems With Blood Pressure         Heartburn: Avoid lying down for at least 1 hour after meals  Aciphex      Maalox     Rash:  Milk of Magnesia     Benadryl    Mylanta       1% Hydrocortisone Cream  Pepcid  Pepcid Complete   Sleep Aids:  Prevacid      Ambien   Prilosec       Benadryl  Rolaids       Chamomile Tea  Tums (Limit 4/day)     Unisom         Tylenol PM         Warm milk-add vanilla or  Hemorrhoids:       Sugar for taste  Anusol/Anusol H.C.  (RX: Analapram 2.5%)  Sugar Substitutes:  Hydrocortisone OTC     Ok in moderation  Preparation H      Tucks        Vaseline lotion applied to tissue with wiping    Herpes:     Throat:  Acyclovir      Oragel  Famvir  Valtrex     Vaccines:         Flu Shot Leg Cramps:       *Gardasil  Benadryl      Hepatitis A         Hepatitis B Nasal Spray:       Pneumovax  Saline Nasal Spray     Polio Booster         Tetanus Nausea:       Tuberculosis test or PPD  Vitamin B6 25 mg TID   AVOID:    Dramamine      *Gardasil  Emetrol       Live Poliovirus  Ginger Root 250 mg  QID    MMR (measles, mumps &  High Complex Carbs @ Bedtime    rebella)  Sea Bands-Accupressure    Varicella (Chickenpox)  Unisom 1/2 tab TID     *No known complications           If received before Pain:         Known pregnancy;   Darvocet       Resume series after  Lortab        Delivery  Percocet    Yeast:   Tramadol      Femstat  Tylenol 3      Gyne-lotrimin  Ultram       Monistat  Vicodin           MISC:         All Sunscreens           Hair  Coloring/highlights          Insect Repellant's          (Including DEET)         Mystic Tans    If you have been instructed to have an in-person evaluation today at a local Urgent Care facility, please use the link below. It will take you to a list of all of our available [facility], including address, phone number and hours of operation. Please do not delay care.  [facility]  If you or a family member do not have a primary care provider, use the link below to schedule a visit and establish care. When you choose a [facility] primary care physician or advanced practice provider, you gain a long-term partner in health. Find a Primary Care Provider  Learn more about [facility]'s in-office and virtual care options: [facility]

## 2022-11-17 NOTE — Progress Notes (Addendum)
Virtual Visit Consent   Erika Solomon, you are scheduled for a virtual visit with a [facility] provider today. Just as with appointments in the office, your consent must be obtained to participate. Your consent will be active for this visit and any virtual visit you may have with one of our providers in the next 365 days. If you have a MyChart account, a copy of this consent can be sent to you electronically.  As this is a virtual visit, video technology does not allow for your provider to perform a traditional examination. This may limit your provider's ability to fully assess your condition. If your provider identifies any concerns that need to be evaluated in person or the need to arrange testing (such as labs, EKG, etc.), we will make arrangements to do so. Although advances in technology are sophisticated, we cannot ensure that it will always work on either your end or our end. If the connection with a video visit is poor, the visit may have to be switched to a telephone visit. With either a video or telephone visit, we are not always able to ensure that we have a secure connection.  By engaging in this virtual visit, you consent to the provision of healthcare and authorize for your insurance to be billed (if applicable) for the services provided during this visit. Depending on your insurance coverage, you may receive a charge related to this service.  I need to obtain your verbal consent now. Are you willing to proceed with your visit today? Erika Solomon has provided verbal consent on 11/17/2022 for a virtual visit (video or telephone). Leeanne Rio, [place]  Date: 11/17/2022 1:04 PM  Virtual Visit via Video Note   I, Leeanne Rio, connected with  Erika Solomon  ([id number], Aug 17, 1996) on 11/17/22 at 12:45 PM EST by a video-enabled telemedicine application and verified that I am speaking with the correct person using two identifiers.  Location: Patient: Virtual Visit  Location Patient: Home Provider: Virtual Visit Location Provider: Home Office   I discussed the limitations of evaluation and management by telemedicine and the availability of in person appointments. The patient expressed understanding and agreed to proceed.    History of Present Illness: Erika Solomon is a 27 y.o. who identifies as a female who was assigned female at birth, and is being seen today due to URI symptoms noted on waking this morning. Notes nasal and chest congestion, cough, hoarseness of voice. Denies fever, chills. Is noting headache but denies body aches.  Denies recent travel or sick contact.   Denies testing for COVID.   OTC -- Took baby Aspirin daily (as instructed by OB)  HPI: HPI  Problems:  Patient Active Problem List   Diagnosis Date Noted   History of cesarean delivery affecting pregnancy 08/24/2022   History of macrosomia in infant in prior pregnancy, currently pregnant 08/24/2022   History of shoulder dystocia in prior pregnancy 08/24/2022   Chronic hypertension affecting pregnancy 07/23/2022   Obesity affecting pregnancy 07/23/2022   Pre-existing type 2 diabetes mellitus during pregnancy 07/05/2022   Supervision of high risk pregnancy, antepartum 06/03/2022   Iron deficiency anemia 12/26/2016   Maternal varicella, non-immune 10/05/2016   Anemia of pregnancy in third trimester 10/05/2016   Hx of preeclampsia, prior pregnancy, currently pregnant 05/21/2015    Allergies:  Allergies  Allergen Reactions   Albuterol Anaphylaxis   Benadryl [Diphenhydramine] Anaphylaxis   Buspar [Buspirone] Anaphylaxis   Strawberry Extract Anaphylaxis   Milk-Related  Compounds Nausea And Vomiting   Medications:  Current Outpatient Medications:    COVID-19 At Home Antigen Test KIT, Use as directed., Disp: 1 kit, Rfl: 0   aspirin 81 MG chewable tablet, Chew 81 mg by mouth daily., Disp: , Rfl:    Ferric Maltol (ACCRUFER) 30 MG CAPS, Take 1 capsule (30 mg total) by mouth 2  (two) times daily., Disp: 60 capsule, Rfl: 3   glucose blood (ACCU-CHEK GUIDE) test strip, 1 each by Other route in the morning, at noon, in the evening, and at bedtime. Check blood glucose four times a day: fasting and 2 hours after breakfast, lunch, and dinner, Disp: 100 each, Rfl: 6   insulin glargine (LANTUS) 100 UNIT/ML injection, Inject 0.3 mLs (30 Units total) into the skin daily before breakfast. (Patient taking differently: Inject 40 Units into the skin at bedtime.), Disp: 10 mL, Rfl: 3   insulin regular (HUMULIN R) 100 units/mL injection, Inject 0.1 mLs (10 Units total) into the skin 3 (three) times daily before meals., Disp: 10 mL, Rfl: 11   Insulin Syringe-Needle U-100 31G X 5/16" 0.5 ML MISC, Use as directed., Disp: 100 each, Rfl: 1   labetalol (NORMODYNE) 200 MG tablet, Take 1 tablet (200 mg total) by mouth 2 (two) times daily., Disp: 60 tablet, Rfl: 6   metoCLOPramide (REGLAN) 10 MG tablet, Take 1 tablet (10 mg total) by mouth 4 (four) times daily as needed for nausea or vomiting. (Patient taking differently: Take 10 mg by mouth as needed for nausea or vomiting.), Disp: 30 tablet, Rfl: 2   ondansetron (ZOFRAN-ODT) 4 MG disintegrating tablet, Take 1 tablet (4 mg total) by mouth every 6 (six) hours as needed for nausea., Disp: 20 tablet, Rfl: 2   Prenatal Vit-Fe Fumarate-FA (MULTIVITAMIN-PRENATAL) 27-0.8 MG TABS tablet, Take 1 tablet by mouth daily at 12 noon., Disp: , Rfl:   Observations/Objective: Patient is well-developed, well-nourished in no acute distress.  Resting comfortably at home.  Head is normocephalic, atraumatic.  No labored breathing. Speech is clear and coherent with logical content.  Patient is alert and oriented at baseline.   Assessment and Plan: 1. Flu-like symptoms - COVID-19 At Home Antigen Test KIT; Use as directed.  Dispense: 1 kit; Refill: 0  Currently pregnant. Want her to COVID test first to be cautious. She is to message directly with results. If  positive, will treat appropriately. If negative, will start Tamiflu. Supportive measures and OTC medications safe in pregnancy reviewed.   Addendum: COVID test negative. Will add Tamiflu as discussed.   Follow Up Instructions: I discussed the assessment and treatment plan with the patient. The patient was provided an opportunity to ask questions and all were answered. The patient agreed with the plan and demonstrated an understanding of the instructions.  A copy of instructions were sent to the patient via MyChart unless otherwise noted below.   The patient was advised to call back or seek an in-person evaluation if the symptoms worsen or if the condition fails to improve as anticipated.  Time:  I spent 10 minutes with the patient via telehealth technology discussing the above problems/concerns.    Leeanne Rio, PA-C

## 2022-11-18 ENCOUNTER — Ambulatory Visit: Payer: Medicaid Other | Attending: Obstetrics

## 2022-11-18 ENCOUNTER — Other Ambulatory Visit: Payer: Self-pay

## 2022-11-18 VITALS — BP 135/73 | HR 102 | Temp 98.1°F | Resp 18 | Ht 63.0 in | Wt 234.0 lb

## 2022-11-18 DIAGNOSIS — O10013 Pre-existing essential hypertension complicating pregnancy, third trimester: Secondary | ICD-10-CM | POA: Diagnosis not present

## 2022-11-18 DIAGNOSIS — O10919 Unspecified pre-existing hypertension complicating pregnancy, unspecified trimester: Secondary | ICD-10-CM

## 2022-11-18 DIAGNOSIS — E119 Type 2 diabetes mellitus without complications: Secondary | ICD-10-CM

## 2022-11-18 DIAGNOSIS — O24113 Pre-existing diabetes mellitus, type 2, in pregnancy, third trimester: Secondary | ICD-10-CM | POA: Diagnosis not present

## 2022-11-18 DIAGNOSIS — O10913 Unspecified pre-existing hypertension complicating pregnancy, third trimester: Secondary | ICD-10-CM | POA: Diagnosis present

## 2022-11-18 DIAGNOSIS — O163 Unspecified maternal hypertension, third trimester: Secondary | ICD-10-CM

## 2022-11-18 DIAGNOSIS — Z794 Long term (current) use of insulin: Secondary | ICD-10-CM

## 2022-11-18 DIAGNOSIS — O24112 Pre-existing diabetes mellitus, type 2, in pregnancy, second trimester: Secondary | ICD-10-CM

## 2022-11-18 DIAGNOSIS — O3663X0 Maternal care for excessive fetal growth, third trimester, not applicable or unspecified: Secondary | ICD-10-CM | POA: Insufficient documentation

## 2022-11-18 DIAGNOSIS — Z3A31 31 weeks gestation of pregnancy: Secondary | ICD-10-CM

## 2022-11-18 DIAGNOSIS — O99213 Obesity complicating pregnancy, third trimester: Secondary | ICD-10-CM | POA: Diagnosis not present

## 2022-11-18 DIAGNOSIS — E669 Obesity, unspecified: Secondary | ICD-10-CM | POA: Diagnosis not present

## 2022-11-18 DIAGNOSIS — O0990 Supervision of high risk pregnancy, unspecified, unspecified trimester: Secondary | ICD-10-CM

## 2022-11-18 DIAGNOSIS — O09293 Supervision of pregnancy with other poor reproductive or obstetric history, third trimester: Secondary | ICD-10-CM | POA: Diagnosis not present

## 2022-11-18 DIAGNOSIS — O34219 Maternal care for unspecified type scar from previous cesarean delivery: Secondary | ICD-10-CM

## 2022-11-23 ENCOUNTER — Other Ambulatory Visit: Payer: Self-pay

## 2022-11-23 ENCOUNTER — Encounter: Payer: Self-pay | Admitting: Obstetrics and Gynecology

## 2022-11-23 ENCOUNTER — Ambulatory Visit (INDEPENDENT_AMBULATORY_CARE_PROVIDER_SITE_OTHER): Payer: Medicaid Other | Admitting: Obstetrics and Gynecology

## 2022-11-23 VITALS — BP 124/83 | HR 93 | Wt 237.3 lb

## 2022-11-23 DIAGNOSIS — O24112 Pre-existing diabetes mellitus, type 2, in pregnancy, second trimester: Secondary | ICD-10-CM

## 2022-11-23 DIAGNOSIS — O24414 Gestational diabetes mellitus in pregnancy, insulin controlled: Secondary | ICD-10-CM

## 2022-11-23 DIAGNOSIS — Z98891 History of uterine scar from previous surgery: Secondary | ICD-10-CM

## 2022-11-23 DIAGNOSIS — O99213 Obesity complicating pregnancy, third trimester: Secondary | ICD-10-CM

## 2022-11-23 DIAGNOSIS — Z3A32 32 weeks gestation of pregnancy: Secondary | ICD-10-CM

## 2022-11-23 DIAGNOSIS — O24113 Pre-existing diabetes mellitus, type 2, in pregnancy, third trimester: Secondary | ICD-10-CM

## 2022-11-23 DIAGNOSIS — E119 Type 2 diabetes mellitus without complications: Secondary | ICD-10-CM

## 2022-11-23 DIAGNOSIS — O0992 Supervision of high risk pregnancy, unspecified, second trimester: Secondary | ICD-10-CM

## 2022-11-23 DIAGNOSIS — Z794 Long term (current) use of insulin: Secondary | ICD-10-CM

## 2022-11-23 LAB — POCT URINALYSIS DIPSTICK OB
Bilirubin, UA: NEGATIVE
Glucose, UA: NEGATIVE
Ketones, UA: NEGATIVE
Leukocytes, UA: NEGATIVE
Nitrite, UA: NEGATIVE
Spec Grav, UA: 1.02 (ref 1.010–1.025)
Urobilinogen, UA: 0.2 E.U./dL
pH, UA: 6 (ref 5.0–8.0)

## 2022-11-23 NOTE — Progress Notes (Signed)
ROB. Patient states daily fetal movement with constant discomfort. She states continuing to check sugars as she remembers, does not have her log today. She would like to discuss Tolac vs RCS today.  BPP scheduled for 11/25/22 with MFM. Patient states no questions or concerns at this time.

## 2022-11-23 NOTE — Progress Notes (Signed)
ROB: Reports she is generally doing well.  Still cannot decide between TOLAC and vaginal birth.  Previous vaginal birth ended in a shoulder dystocia with fetal arm and shoulder complications.  Next birth was a 10 pound infant born by cesarean for CPD.  We have talked through multiple strategies regarding decision making and the risk and benefits of vaginal versus cesarean delivery.  All questions answered.  Patient states she will decide by next visit. She is keeping her sugar log but did not bring it today.  She reports her fastings are often approximately 115.  I have asked her to increase her Lantus to 40 in the evening.  I have stressed the importance of bringing her log book. Blood pressures currently controlled. BPP this week. Will begin weekly NSTs next week.

## 2022-11-24 ENCOUNTER — Ambulatory Visit
Admission: RE | Admit: 2022-11-24 | Discharge: 2022-11-24 | Disposition: A | Discharge status: Home / Self Care | Payer: Medicaid Other | Source: Ambulatory Visit | Attending: Obstetrics and Gynecology | Admitting: Obstetrics and Gynecology

## 2022-11-24 DIAGNOSIS — Z3A00 Weeks of gestation of pregnancy not specified: Secondary | ICD-10-CM | POA: Insufficient documentation

## 2022-11-24 DIAGNOSIS — O99013 Anemia complicating pregnancy, third trimester: Secondary | ICD-10-CM | POA: Diagnosis not present

## 2022-11-24 DIAGNOSIS — D649 Anemia, unspecified: Secondary | ICD-10-CM | POA: Diagnosis not present

## 2022-11-24 MED ORDER — SODIUM CHLORIDE 0.9 % IV SOLN
200.0000 mg | INTRAVENOUS | Status: DC
  Administered 2022-11-24: 200 mg via INTRAVENOUS
  Filled 2022-11-24: qty 200

## 2022-11-24 MED ORDER — PENTAFLUOROPROP-TETRAFLUOROETH EX AERO
INHALATION_SPRAY | CUTANEOUS | Status: AC
  Filled 2022-11-24: qty 30

## 2022-11-25 ENCOUNTER — Other Ambulatory Visit: Payer: Self-pay

## 2022-11-25 ENCOUNTER — Ambulatory Visit: Payer: Medicaid Other | Attending: Obstetrics

## 2022-11-25 VITALS — BP 135/81 | HR 97 | Temp 97.5°F | Resp 18 | Ht 63.0 in | Wt 236.5 lb

## 2022-11-25 DIAGNOSIS — O09293 Supervision of pregnancy with other poor reproductive or obstetric history, third trimester: Secondary | ICD-10-CM | POA: Diagnosis not present

## 2022-11-25 DIAGNOSIS — O24414 Gestational diabetes mellitus in pregnancy, insulin controlled: Secondary | ICD-10-CM

## 2022-11-25 DIAGNOSIS — O24113 Pre-existing diabetes mellitus, type 2, in pregnancy, third trimester: Secondary | ICD-10-CM | POA: Insufficient documentation

## 2022-11-25 DIAGNOSIS — O3663X0 Maternal care for excessive fetal growth, third trimester, not applicable or unspecified: Secondary | ICD-10-CM | POA: Insufficient documentation

## 2022-11-25 DIAGNOSIS — Z794 Long term (current) use of insulin: Secondary | ICD-10-CM

## 2022-11-25 DIAGNOSIS — Z98891 History of uterine scar from previous surgery: Secondary | ICD-10-CM

## 2022-11-25 DIAGNOSIS — E119 Type 2 diabetes mellitus without complications: Secondary | ICD-10-CM

## 2022-11-25 DIAGNOSIS — O10013 Pre-existing essential hypertension complicating pregnancy, third trimester: Secondary | ICD-10-CM

## 2022-11-25 DIAGNOSIS — O99213 Obesity complicating pregnancy, third trimester: Secondary | ICD-10-CM | POA: Insufficient documentation

## 2022-11-25 DIAGNOSIS — O34219 Maternal care for unspecified type scar from previous cesarean delivery: Secondary | ICD-10-CM

## 2022-11-25 DIAGNOSIS — O10913 Unspecified pre-existing hypertension complicating pregnancy, third trimester: Secondary | ICD-10-CM | POA: Diagnosis not present

## 2022-11-25 DIAGNOSIS — O10919 Unspecified pre-existing hypertension complicating pregnancy, unspecified trimester: Secondary | ICD-10-CM

## 2022-11-25 DIAGNOSIS — Z3A32 32 weeks gestation of pregnancy: Secondary | ICD-10-CM | POA: Insufficient documentation

## 2022-11-25 DIAGNOSIS — O0990 Supervision of high risk pregnancy, unspecified, unspecified trimester: Secondary | ICD-10-CM

## 2022-11-25 DIAGNOSIS — O24112 Pre-existing diabetes mellitus, type 2, in pregnancy, second trimester: Secondary | ICD-10-CM

## 2022-11-25 DIAGNOSIS — E669 Obesity, unspecified: Secondary | ICD-10-CM

## 2022-11-30 ENCOUNTER — Ambulatory Visit (INDEPENDENT_AMBULATORY_CARE_PROVIDER_SITE_OTHER): Payer: Medicaid Other | Admitting: Obstetrics and Gynecology

## 2022-11-30 ENCOUNTER — Other Ambulatory Visit: Payer: Medicaid Other

## 2022-11-30 ENCOUNTER — Other Ambulatory Visit: Payer: Self-pay

## 2022-11-30 ENCOUNTER — Encounter: Payer: Self-pay | Admitting: Obstetrics and Gynecology

## 2022-11-30 VITALS — BP 130/80 | HR 103 | Wt 239.2 lb

## 2022-11-30 DIAGNOSIS — O09293 Supervision of pregnancy with other poor reproductive or obstetric history, third trimester: Secondary | ICD-10-CM

## 2022-11-30 DIAGNOSIS — O0992 Supervision of high risk pregnancy, unspecified, second trimester: Secondary | ICD-10-CM

## 2022-11-30 DIAGNOSIS — O99213 Obesity complicating pregnancy, third trimester: Secondary | ICD-10-CM

## 2022-11-30 DIAGNOSIS — N3001 Acute cystitis with hematuria: Secondary | ICD-10-CM

## 2022-11-30 DIAGNOSIS — O0993 Supervision of high risk pregnancy, unspecified, third trimester: Secondary | ICD-10-CM

## 2022-11-30 DIAGNOSIS — Z3A33 33 weeks gestation of pregnancy: Secondary | ICD-10-CM

## 2022-11-30 DIAGNOSIS — Z98891 History of uterine scar from previous surgery: Secondary | ICD-10-CM

## 2022-11-30 DIAGNOSIS — O24414 Gestational diabetes mellitus in pregnancy, insulin controlled: Secondary | ICD-10-CM

## 2022-11-30 LAB — POCT URINALYSIS DIPSTICK OB
Bilirubin, UA: NEGATIVE
Glucose, UA: NEGATIVE
Ketones, UA: NEGATIVE
Nitrite, UA: NEGATIVE
Spec Grav, UA: 1.02 (ref 1.010–1.025)
Urobilinogen, UA: 0.2 E.U./dL
pH, UA: 6 (ref 5.0–8.0)

## 2022-11-30 NOTE — Progress Notes (Signed)
ROB. Patient states daily fetal movement along with lower stomach and pelvic discomfort. She brought her sugar log today. BPP scheduled for 2/29. She states having her last iron infusion, questions about lab work.

## 2022-11-30 NOTE — Progress Notes (Signed)
ROB: Patient having pressure symptoms but no specific urinary symptoms.  Urine sent for culture.  Reports daily fetal movement.  Now getting BPP's once weekly.  Blood pressure looks good.  Fasting sugars and dinnertime sugars not good.  Increased insulin to 40 at night and 12 before dinnertime.  Discussed in detail with patient.  She is still considering cesarean delivery and would like to base this on a growth scan at approximately 36 weeks.

## 2022-12-02 ENCOUNTER — Other Ambulatory Visit: Payer: Self-pay

## 2022-12-02 ENCOUNTER — Ambulatory Visit: Payer: Medicaid Other | Attending: Obstetrics

## 2022-12-02 VITALS — BP 126/79 | HR 109 | Temp 97.8°F | Ht 63.0 in | Wt 239.0 lb

## 2022-12-02 DIAGNOSIS — Z3A33 33 weeks gestation of pregnancy: Secondary | ICD-10-CM | POA: Insufficient documentation

## 2022-12-02 DIAGNOSIS — O34219 Maternal care for unspecified type scar from previous cesarean delivery: Secondary | ICD-10-CM

## 2022-12-02 DIAGNOSIS — E669 Obesity, unspecified: Secondary | ICD-10-CM

## 2022-12-02 DIAGNOSIS — O09293 Supervision of pregnancy with other poor reproductive or obstetric history, third trimester: Secondary | ICD-10-CM | POA: Diagnosis not present

## 2022-12-02 DIAGNOSIS — O10913 Unspecified pre-existing hypertension complicating pregnancy, third trimester: Secondary | ICD-10-CM | POA: Diagnosis not present

## 2022-12-02 DIAGNOSIS — O3663X0 Maternal care for excessive fetal growth, third trimester, not applicable or unspecified: Secondary | ICD-10-CM | POA: Insufficient documentation

## 2022-12-02 DIAGNOSIS — O10013 Pre-existing essential hypertension complicating pregnancy, third trimester: Secondary | ICD-10-CM

## 2022-12-02 DIAGNOSIS — E119 Type 2 diabetes mellitus without complications: Secondary | ICD-10-CM

## 2022-12-02 DIAGNOSIS — O10919 Unspecified pre-existing hypertension complicating pregnancy, unspecified trimester: Secondary | ICD-10-CM

## 2022-12-02 DIAGNOSIS — O42913 Preterm premature rupture of membranes, unspecified as to length of time between rupture and onset of labor, third trimester: Secondary | ICD-10-CM | POA: Diagnosis not present

## 2022-12-02 DIAGNOSIS — O24414 Gestational diabetes mellitus in pregnancy, insulin controlled: Secondary | ICD-10-CM

## 2022-12-02 DIAGNOSIS — O24113 Pre-existing diabetes mellitus, type 2, in pregnancy, third trimester: Secondary | ICD-10-CM | POA: Diagnosis present

## 2022-12-02 DIAGNOSIS — O99213 Obesity complicating pregnancy, third trimester: Secondary | ICD-10-CM | POA: Insufficient documentation

## 2022-12-02 DIAGNOSIS — Z98891 History of uterine scar from previous surgery: Secondary | ICD-10-CM

## 2022-12-02 DIAGNOSIS — Z794 Long term (current) use of insulin: Secondary | ICD-10-CM

## 2022-12-02 DIAGNOSIS — O0990 Supervision of high risk pregnancy, unspecified, unspecified trimester: Secondary | ICD-10-CM

## 2022-12-02 LAB — URINE CULTURE

## 2022-12-06 ENCOUNTER — Other Ambulatory Visit: Payer: Self-pay | Admitting: Obstetrics and Gynecology

## 2022-12-06 DIAGNOSIS — O24012 Pre-existing diabetes mellitus, type 1, in pregnancy, second trimester: Secondary | ICD-10-CM

## 2022-12-07 ENCOUNTER — Other Ambulatory Visit: Payer: Self-pay

## 2022-12-07 DIAGNOSIS — O99213 Obesity complicating pregnancy, third trimester: Secondary | ICD-10-CM

## 2022-12-07 DIAGNOSIS — Z98891 History of uterine scar from previous surgery: Secondary | ICD-10-CM

## 2022-12-07 DIAGNOSIS — O24414 Gestational diabetes mellitus in pregnancy, insulin controlled: Secondary | ICD-10-CM

## 2022-12-07 DIAGNOSIS — O09293 Supervision of pregnancy with other poor reproductive or obstetric history, third trimester: Secondary | ICD-10-CM

## 2022-12-08 ENCOUNTER — Other Ambulatory Visit: Payer: Self-pay

## 2022-12-08 ENCOUNTER — Ambulatory Visit: Payer: Medicaid Other | Attending: Obstetrics

## 2022-12-08 DIAGNOSIS — Z3A34 34 weeks gestation of pregnancy: Secondary | ICD-10-CM | POA: Diagnosis not present

## 2022-12-08 DIAGNOSIS — O99213 Obesity complicating pregnancy, third trimester: Secondary | ICD-10-CM | POA: Diagnosis not present

## 2022-12-08 DIAGNOSIS — O10013 Pre-existing essential hypertension complicating pregnancy, third trimester: Secondary | ICD-10-CM

## 2022-12-08 DIAGNOSIS — E669 Obesity, unspecified: Secondary | ICD-10-CM | POA: Diagnosis not present

## 2022-12-08 DIAGNOSIS — O418X30 Other specified disorders of amniotic fluid and membranes, third trimester, not applicable or unspecified: Secondary | ICD-10-CM | POA: Diagnosis not present

## 2022-12-08 DIAGNOSIS — O09293 Supervision of pregnancy with other poor reproductive or obstetric history, third trimester: Secondary | ICD-10-CM | POA: Diagnosis not present

## 2022-12-08 DIAGNOSIS — O24113 Pre-existing diabetes mellitus, type 2, in pregnancy, third trimester: Secondary | ICD-10-CM | POA: Diagnosis not present

## 2022-12-08 DIAGNOSIS — O42913 Preterm premature rupture of membranes, unspecified as to length of time between rupture and onset of labor, third trimester: Secondary | ICD-10-CM | POA: Insufficient documentation

## 2022-12-08 DIAGNOSIS — O3663X0 Maternal care for excessive fetal growth, third trimester, not applicable or unspecified: Secondary | ICD-10-CM | POA: Diagnosis not present

## 2022-12-08 DIAGNOSIS — Z794 Long term (current) use of insulin: Secondary | ICD-10-CM

## 2022-12-08 DIAGNOSIS — E119 Type 2 diabetes mellitus without complications: Secondary | ICD-10-CM | POA: Diagnosis not present

## 2022-12-08 DIAGNOSIS — Z98891 History of uterine scar from previous surgery: Secondary | ICD-10-CM

## 2022-12-08 DIAGNOSIS — O24414 Gestational diabetes mellitus in pregnancy, insulin controlled: Secondary | ICD-10-CM

## 2022-12-08 DIAGNOSIS — O10913 Unspecified pre-existing hypertension complicating pregnancy, third trimester: Secondary | ICD-10-CM | POA: Diagnosis not present

## 2022-12-08 DIAGNOSIS — O34219 Maternal care for unspecified type scar from previous cesarean delivery: Secondary | ICD-10-CM

## 2022-12-08 NOTE — Progress Notes (Signed)
Pt presents to MFM for scheduled BPP. States she is very down because she was supposed to get married on Saturday and partner got "cold feet". Depression screening high but pt declines resources and denies feeling of suicide and states she feels safe. Pt encouraged to reach out to [facility] or MFM with changes or need for resources.

## 2022-12-09 ENCOUNTER — Other Ambulatory Visit: Payer: Medicaid Other

## 2022-12-14 ENCOUNTER — Other Ambulatory Visit (HOSPITAL_COMMUNITY)
Admission: RE | Admit: 2022-12-14 | Discharge: 2022-12-14 | Disposition: A | Discharge status: Home / Self Care | Payer: Medicaid Other | Source: Ambulatory Visit | Attending: Obstetrics and Gynecology | Admitting: Obstetrics and Gynecology

## 2022-12-14 ENCOUNTER — Other Ambulatory Visit: Payer: Self-pay

## 2022-12-14 ENCOUNTER — Encounter: Payer: Self-pay | Admitting: Licensed Practical Nurse

## 2022-12-14 ENCOUNTER — Ambulatory Visit (INDEPENDENT_AMBULATORY_CARE_PROVIDER_SITE_OTHER): Payer: Medicaid Other | Admitting: Licensed Practical Nurse

## 2022-12-14 VITALS — BP 130/78 | HR 106 | Wt 241.0 lb

## 2022-12-14 DIAGNOSIS — Z113 Encounter for screening for infections with a predominantly sexual mode of transmission: Secondary | ICD-10-CM

## 2022-12-14 DIAGNOSIS — Z794 Long term (current) use of insulin: Secondary | ICD-10-CM

## 2022-12-14 DIAGNOSIS — N898 Other specified noninflammatory disorders of vagina: Secondary | ICD-10-CM | POA: Insufficient documentation

## 2022-12-14 DIAGNOSIS — O99013 Anemia complicating pregnancy, third trimester: Secondary | ICD-10-CM

## 2022-12-14 DIAGNOSIS — O0990 Supervision of high risk pregnancy, unspecified, unspecified trimester: Secondary | ICD-10-CM | POA: Insufficient documentation

## 2022-12-14 DIAGNOSIS — O99213 Obesity complicating pregnancy, third trimester: Secondary | ICD-10-CM

## 2022-12-14 DIAGNOSIS — E669 Obesity, unspecified: Secondary | ICD-10-CM

## 2022-12-14 DIAGNOSIS — O10013 Pre-existing essential hypertension complicating pregnancy, third trimester: Secondary | ICD-10-CM

## 2022-12-14 DIAGNOSIS — Z3A35 35 weeks gestation of pregnancy: Secondary | ICD-10-CM

## 2022-12-14 DIAGNOSIS — O24012 Pre-existing diabetes mellitus, type 1, in pregnancy, second trimester: Secondary | ICD-10-CM

## 2022-12-14 DIAGNOSIS — O09293 Supervision of pregnancy with other poor reproductive or obstetric history, third trimester: Secondary | ICD-10-CM

## 2022-12-14 DIAGNOSIS — O10919 Unspecified pre-existing hypertension complicating pregnancy, unspecified trimester: Secondary | ICD-10-CM

## 2022-12-14 DIAGNOSIS — Z98891 History of uterine scar from previous surgery: Secondary | ICD-10-CM

## 2022-12-14 DIAGNOSIS — E119 Type 2 diabetes mellitus without complications: Secondary | ICD-10-CM

## 2022-12-14 DIAGNOSIS — O24414 Gestational diabetes mellitus in pregnancy, insulin controlled: Secondary | ICD-10-CM

## 2022-12-14 DIAGNOSIS — O10913 Unspecified pre-existing hypertension complicating pregnancy, third trimester: Secondary | ICD-10-CM

## 2022-12-14 DIAGNOSIS — Z3685 Encounter for antenatal screening for Streptococcus B: Secondary | ICD-10-CM

## 2022-12-14 DIAGNOSIS — O24113 Pre-existing diabetes mellitus, type 2, in pregnancy, third trimester: Secondary | ICD-10-CM

## 2022-12-14 DIAGNOSIS — R3129 Other microscopic hematuria: Secondary | ICD-10-CM

## 2022-12-14 LAB — POCT URINALYSIS DIPSTICK OB
Bilirubin, UA: NEGATIVE
Blood, UA: POSITIVE
Glucose, UA: NEGATIVE
Ketones, UA: NEGATIVE
Nitrite, UA: NEGATIVE
Spec Grav, UA: 1.01 (ref 1.010–1.025)
Urobilinogen, UA: 1 E.U./dL
pH, UA: 6 (ref 5.0–8.0)

## 2022-12-14 MED ORDER — INSULIN GLARGINE 100 UNIT/ML ~~LOC~~ SOLN: 44.0000 [IU] | Freq: Every day | SUBCUTANEOUS | 1 refills | Status: DC

## 2022-12-14 MED ORDER — INSULIN REGULAR HUMAN 100 UNIT/ML IJ SOLN: 14.0000 [IU] | Freq: Three times a day (TID) | INTRAMUSCULAR | 11 refills | Status: DC

## 2022-12-14 NOTE — Progress Notes (Signed)
Routine Prenatal Care Visit  Subjective  Erika Solomon is a 27 y.o. [phone] at 86w0dbeing seen today for ongoing prenatal care.  She is currently monitored for the following issues for this high-risk pregnancy and has Hx of preeclampsia, prior pregnancy, currently pregnant; Maternal varicella, non-immune; Anemia of pregnancy in third trimester; Iron deficiency anemia; Supervision of high risk pregnancy, antepartum; Pre-existing type 2 diabetes mellitus during pregnancy; Chronic hypertension affecting pregnancy; Obesity affecting pregnancy; History of cesarean delivery affecting pregnancy; History of macrosomia in infant in prior pregnancy, currently pregnant; and History of shoulder dystocia in prior pregnancy on their problem list.  ----------------------------------------------------------------------------------- Patient reports fatigue.   -having a hard time eating because she is immediatly full and uncomfortable, does not always eat 3 meals day.  -a little uneasy about knowing "the plan" yet, has [place], desires RLTCS fetus is "big" otherwise would like to attempt a vaginal birth.  -This is her partner's first child, they have limited PP support, He prefers to lmit visitors for the first month, she understands how overwhelming PP recovery can be and knows to reach out to family/friends as needed, she is not sure what she will do with her older children while she is birthing.  -GDM: all fasting's elevated 97-124, 7/17 after meals elevated highest 185. -has an increased clear discharge, sometimes feels like she is peeing on herself. SSE: negative pooling, ferning and Nitrazine. 36wks labs collected.  -undecided about contraception, may desires another child. May consider Progesterone only options.    Contractions: Irregular. Vag. Bleeding: None.  Movement: Present. Leaking Fluid denies.  ----------------------------------------------------------------------------------- The following  portions of the patient's history were reviewed and updated as appropriate: allergies, current medications, past family history, past medical history, past social history, past surgical history and problem list. Problem list updated.  Objective  Blood pressure 130/78, pulse (!) 106, weight 241 lb (109.3 kg), last menstrual period 04/13/2022. Pregravid weight 230 lb (104.3 kg) Total Weight Gain 11 lb (4.99 kg) Urinalysis: Urine Protein Small (1+)  Urine Glucose Negative  Fetal Status: Fetal Heart Rate (bpm): 143   Movement: Present     General:  Alert, oriented and cooperative. Patient is in no acute distress.  Skin: Skin is warm and dry. No rash noted.   Cardiovascular: Normal heart rate noted  Respiratory: Normal respiratory effort, no problems with respiration noted  Abdomen: Soft, gravid, appropriate for gestational age. Pain/Pressure: Present     Pelvic:  Cervical exam deferred        Extremities: Normal range of motion.  Edema: None  Mental Status: Normal mood and affect. Normal behavior. Normal judgment and thought content.   Assessment   27y.o. [id number]  01/18/2023, by Last Menstrual Period presenting for routine prenatal visit  Plan   four Problems (from 05/21/22 to present)     Problem Noted Resolved   Chronic hypertension affecting pregnancy 07/23/2022 by GlRod CanCNM No   Obesity affecting pregnancy 07/23/2022 by GlRod CanCNM No   Pre-existing type 2 diabetes mellitus during pregnancy 07/05/2022 by FrImagene RichesCNM No   Overview Signed 07/05/2022 11:48 AM by FrImagene RichesCNM    elevated 1hr GTT- sent to [facility].06/2022      Supervision of high risk pregnancy, antepartum 06/03/2022 by FrImagene RichesCNM No   Overview Addendum 11/08/2022 11:14 AM by BoChilton GreathouseCMNellis AFBtaff Provider  Office Location  [facility] Dating  06/24/2022  Language  English  Anatomy US  08/24/2022  Flu Vaccine  Declined Genetic Screen  NIPS:  Negative/Female (06/30/2022)  TDaP vaccine    Hgb A1C or  GTT Early :5.4 Third trimester :   Covid    LAB RESULTS   Rhogam  N/A Blood Type A/Positive/-- (08/21 1105)   Feeding Plan  Breast?  Antibody Negative (08/21 1105)  Contraception Unsure Rubella <0.90 (08/21 1105)  Circumcision N/A RPR Non Reactive (08/21 1105)   Pediatrician  [facility]/Dr.D HBsAg Negative (08/21 1105)   Support Person Lennette Bihari HIV Non Reactive (08/21 1105)  Prenatal Classes  Varicella     GBS  (For PCN allergy, check sensitivities)   BTL Consent     VBAC Consent  Pap  06/03/2022 NILM    Hgb Electro      CF      SMA                   Preterm labor symptoms and general obstetric precautions including but not limited to vaginal bleeding, contractions, leaking of fluid and fetal movement were reviewed in detail with the patient. Please refer to After Visit Summary for other counseling recommendations.   Return in about 1 week (around 12/21/2022) for [place].  Reviewed blood glucose with Dr Marcelline Mates (after pt left clinic) will increase Lantus to 44 units and Novolin to 14 units, pt called LVM and sent mychart message.   Roberto Scales, [facility]  12/14/22  2:40 PM

## 2022-12-15 ENCOUNTER — Telehealth: Payer: Self-pay

## 2022-12-15 ENCOUNTER — Other Ambulatory Visit: Payer: Self-pay

## 2022-12-15 ENCOUNTER — Other Ambulatory Visit: Payer: Self-pay | Admitting: Obstetrics

## 2022-12-15 ENCOUNTER — Ambulatory Visit (HOSPITAL_BASED_OUTPATIENT_CLINIC_OR_DEPARTMENT_OTHER): Payer: Medicaid Other

## 2022-12-15 ENCOUNTER — Encounter: Payer: Medicaid Other | Admitting: Obstetrics and Gynecology

## 2022-12-15 ENCOUNTER — Observation Stay
Admission: AD | Admit: 2022-12-15 | Discharge: 2022-12-15 | Disposition: A | Discharge status: Home / Self Care | Payer: Medicaid Other | Attending: Obstetrics and Gynecology | Admitting: Obstetrics and Gynecology

## 2022-12-15 ENCOUNTER — Encounter: Payer: Self-pay | Admitting: Obstetrics and Gynecology

## 2022-12-15 VITALS — BP 142/95 | HR 101 | Temp 98.1°F | Ht 64.0 in | Wt 243.5 lb

## 2022-12-15 DIAGNOSIS — O24419 Gestational diabetes mellitus in pregnancy, unspecified control: Secondary | ICD-10-CM

## 2022-12-15 DIAGNOSIS — O10913 Unspecified pre-existing hypertension complicating pregnancy, third trimester: Secondary | ICD-10-CM | POA: Insufficient documentation

## 2022-12-15 DIAGNOSIS — O99213 Obesity complicating pregnancy, third trimester: Secondary | ICD-10-CM | POA: Insufficient documentation

## 2022-12-15 DIAGNOSIS — O169 Unspecified maternal hypertension, unspecified trimester: Secondary | ICD-10-CM | POA: Diagnosis present

## 2022-12-15 DIAGNOSIS — O34219 Maternal care for unspecified type scar from previous cesarean delivery: Secondary | ICD-10-CM

## 2022-12-15 DIAGNOSIS — O10919 Unspecified pre-existing hypertension complicating pregnancy, unspecified trimester: Secondary | ICD-10-CM

## 2022-12-15 DIAGNOSIS — O24112 Pre-existing diabetes mellitus, type 2, in pregnancy, second trimester: Secondary | ICD-10-CM

## 2022-12-15 DIAGNOSIS — O10013 Pre-existing essential hypertension complicating pregnancy, third trimester: Secondary | ICD-10-CM

## 2022-12-15 DIAGNOSIS — E669 Obesity, unspecified: Secondary | ICD-10-CM

## 2022-12-15 DIAGNOSIS — O163 Unspecified maternal hypertension, third trimester: Principal | ICD-10-CM | POA: Insufficient documentation

## 2022-12-15 DIAGNOSIS — Z3A35 35 weeks gestation of pregnancy: Secondary | ICD-10-CM | POA: Insufficient documentation

## 2022-12-15 DIAGNOSIS — O321XX0 Maternal care for breech presentation, not applicable or unspecified: Secondary | ICD-10-CM | POA: Insufficient documentation

## 2022-12-15 DIAGNOSIS — O3663X0 Maternal care for excessive fetal growth, third trimester, not applicable or unspecified: Secondary | ICD-10-CM | POA: Insufficient documentation

## 2022-12-15 DIAGNOSIS — O24414 Gestational diabetes mellitus in pregnancy, insulin controlled: Secondary | ICD-10-CM

## 2022-12-15 DIAGNOSIS — Z98891 History of uterine scar from previous surgery: Secondary | ICD-10-CM | POA: Insufficient documentation

## 2022-12-15 DIAGNOSIS — Z794 Long term (current) use of insulin: Secondary | ICD-10-CM | POA: Insufficient documentation

## 2022-12-15 DIAGNOSIS — O09293 Supervision of pregnancy with other poor reproductive or obstetric history, third trimester: Secondary | ICD-10-CM

## 2022-12-15 DIAGNOSIS — E1165 Type 2 diabetes mellitus with hyperglycemia: Secondary | ICD-10-CM

## 2022-12-15 DIAGNOSIS — O24113 Pre-existing diabetes mellitus, type 2, in pregnancy, third trimester: Secondary | ICD-10-CM

## 2022-12-15 DIAGNOSIS — O0990 Supervision of high risk pregnancy, unspecified, unspecified trimester: Secondary | ICD-10-CM

## 2022-12-15 DIAGNOSIS — O418X30 Other specified disorders of amniotic fluid and membranes, third trimester, not applicable or unspecified: Secondary | ICD-10-CM | POA: Diagnosis not present

## 2022-12-15 DIAGNOSIS — O0992 Supervision of high risk pregnancy, unspecified, second trimester: Secondary | ICD-10-CM

## 2022-12-15 LAB — CBC
HCT: 29 % — ABNORMAL LOW (ref 36.0–46.0)
Hematocrit: 31.2 % — ABNORMAL LOW (ref 34.0–46.6)
Hemoglobin: 9.8 g/dL — ABNORMAL LOW (ref 11.1–15.9)
Hemoglobin: 9.3 g/dL — ABNORMAL LOW (ref 12.0–15.0)
MCH: 27.6 pg (ref 26.6–33.0)
MCH: 28 pg (ref 26.0–34.0)
MCHC: 31.4 g/dL — ABNORMAL LOW (ref 31.5–35.7)
MCHC: 32.1 g/dL (ref 30.0–36.0)
MCV: 88 fL (ref 79–97)
MCV: 87.3 fL (ref 80.0–100.0)
Platelets: 190 10*3/uL (ref 150–450)
Platelets: 164 10*3/uL (ref 150–400)
RBC: 3.55 x10E6/uL — ABNORMAL LOW (ref 3.77–5.28)
RBC: 3.32 MIL/uL — ABNORMAL LOW (ref 3.87–5.11)
RDW: 17 % — ABNORMAL HIGH (ref 11.7–15.4)
RDW: 17.4 % — ABNORMAL HIGH (ref 11.5–15.5)
WBC: 6.1 10*3/uL (ref 3.4–10.8)
WBC: 6.8 10*3/uL (ref 4.0–10.5)
nRBC: 0 % (ref 0.0–0.2)

## 2022-12-15 LAB — COMPREHENSIVE METABOLIC PANEL
ALT: 11 U/L (ref 0–44)
AST: 16 U/L (ref 15–41)
Albumin: 2.9 g/dL — ABNORMAL LOW (ref 3.5–5.0)
Alkaline Phosphatase: 68 U/L (ref 38–126)
Anion gap: 5 (ref 5–15)
BUN: 5 mg/dL — ABNORMAL LOW (ref 6–20)
CO2: 22 mmol/L (ref 22–32)
Calcium: 9.2 mg/dL (ref 8.9–10.3)
Chloride: 108 mmol/L (ref 98–111)
Creatinine, Ser: 0.48 mg/dL (ref 0.44–1.00)
GFR, Estimated: >60 mL/min (ref >60)
Glucose, Bld: 76 mg/dL (ref 70–99)
Potassium: 3.8 mmol/L (ref 3.5–5.1)
Sodium: 135 mmol/L (ref 135–145)
Total Bilirubin: 0.5 mg/dL (ref 0.3–1.2)
Total Protein: 6.7 g/dL (ref 6.5–8.1)

## 2022-12-15 LAB — CERVICOVAGINAL ANCILLARY ONLY
Bacterial Vaginitis (gardnerella): NEGATIVE
Candida Glabrata: NEGATIVE
Candida Vaginitis: NEGATIVE
Chlamydia: NEGATIVE
Comment: NEGATIVE
Comment: NEGATIVE
Comment: NEGATIVE
Comment: NEGATIVE
Comment: NORMAL
Neisseria Gonorrhea: NEGATIVE

## 2022-12-15 LAB — PROTEIN / CREATININE RATIO, URINE
Creatinine, Urine: 134 mg/dL
Protein Creatinine Ratio: 0.29 mg/mg{Cre} — ABNORMAL HIGH (ref 0.00–0.15)
Total Protein, Urine: 39 mg/dL

## 2022-12-15 MED ORDER — ACETAMINOPHEN 325 MG PO TABS
650.0000 mg | ORAL_TABLET | ORAL | Status: DC | PRN
  Administered 2022-12-15: 650 mg via ORAL
  Filled 2022-12-15: qty 2

## 2022-12-15 NOTE — OB Triage Note (Signed)
Pt G4P3 17w1dpresents [facility] Pt reports HA 6/10 that started this morning. Pt reports she did not take anything for the HA today. Reports blurry vision/dizziness on Monday but resolved.  No epigastric pain. +2 reflexes. No clonus. +FM. Denies LOF, bleeding, ctx. Hx preE, c/s. Insulin dep.

## 2022-12-15 NOTE — Telephone Encounter (Signed)
Per Gigi Gin, CNM, patient needs twice weekly testing. She is scheduled w/MFM for BPP's on Wednesdays. Will schedule NST for Mondays at our office. Spoke with patient. Advised of plan. Appointments scheduled for 3/18 and 3/27. Patient likely to be delivered at 37 weeks.

## 2022-12-15 NOTE — Progress Notes (Deleted)
Opened in error

## 2022-12-15 NOTE — Discharge Summary (Signed)
Please see final progress note.  Imagene Riches, CNM  12/15/2022 2:16 PM

## 2022-12-15 NOTE — Final Progress Note (Signed)
Final Progress Note  Patient ID: Erika Solomon MRN: [id number] DOB/AGE: 01-11-1996 27 y.o.  Admit date: 12/15/2022 Admitting provider: Rubie Maid, MD Discharge date: 12/15/2022   Admission Diagnoses: elevated blood pressure in third trimester of pregnancy IUP [redacted] weeks gestation  Discharge Diagnoses:  Principal Problem:   Hypertension in pregnancy, antepartum  Gestational diabetes  History of Present Illness: The patient is a 27 y.o. female [phone] at 73w1dwho presents for a PDibollwork up. [facility] and had an elevated blood pressures there . From high 130s/90-140s over 90s. A BPP this morning was 8 out of 8. She also reported a mild headache. She was sent from the MFM office upstairs to [facility] and monitoring. Her pregnancy si high risk due to : gestational diabetes, Hxo fo previous CS, Hx of macrosomia and should dystocia, and morbid obesity.she reports on her arrival, that her blood pressures are elevated due to her anxiety after discussing this pregnancy with the physician provider downstairs.   She is also anemic, and has been on iron tabs and several iron infusions.  Past Medical History:  Diagnosis Date   Anemia    Anxiety and depression    Blood transfusion without reported diagnosis    Gestational diabetes    Hx of preeclampsia, prior pregnancy, currently pregnant    Macrosomia    Panic disorder    Postpartum care following cesarean delivery 12/26/2016   Shoulder dystocia, delivered     Past Surgical History:  Procedure Laterality Date   CESAREAN SECTION N/A 12/23/2016   Procedure: CESAREAN SECTION;  Surgeon: AMalachy Mood MD;  Location: [facility] ORS;  Service: Obstetrics;  Laterality: N/A;   WISDOM TOOTH EXTRACTION     four; 2018    No current facility-administered medications on file prior to encounter.   Current Outpatient Medications on File Prior to Encounter  Medication Sig Dispense Refill   labetalol  (NORMODYNE) 200 MG tablet Take 1 tablet (200 mg total) by mouth 2 (two) times daily. 60 tablet 6   aspirin 81 MG chewable tablet Chew 81 mg by mouth daily.     Ferric Maltol (ACCRUFER) 30 MG CAPS Take 1 capsule (30 mg total) by mouth 2 (two) times daily. (Patient not taking: Reported on 12/15/2022) 60 capsule 3   glucose blood (ACCU-CHEK GUIDE) test strip 1 each by Other route in the morning, at noon, in the evening, and at bedtime. Check blood glucose four times a day: fasting and 2 hours after breakfast, lunch, and dinner 100 each 6   insulin glargine (LANTUS) 100 UNIT/ML injection Inject 0.44 mLs (44 Units total) into the skin at bedtime. Pt taking as of 2/29 30 mL 1   insulin regular (HUMULIN R) 100 units/mL injection Inject 0.14 mLs (14 Units total) into the skin 3 (three) times daily before meals. 10 mL 11   Insulin Syringe-Needle U-100 31G X 5/16" 0.5 ML MISC Use as directed. 100 each 1   metoCLOPramide (REGLAN) 10 MG tablet Take 1 tablet (10 mg total) by mouth 4 (four) times daily as needed for nausea or vomiting. (Patient taking differently: Take 10 mg by mouth as needed for nausea or vomiting.) 30 tablet 2   ondansetron (ZOFRAN-ODT) 4 MG disintegrating tablet Take 1 tablet (4 mg total) by mouth every 6 (six) hours as needed for nausea. 20 tablet 2   Prenatal Vit-Fe Fumarate-FA (MULTIVITAMIN-PRENATAL) 27-0.8 MG TABS tablet Take 1 tablet by mouth daily at 12  noon.      Allergies  Allergen Reactions   Albuterol Anaphylaxis   Benadryl [Diphenhydramine] Anaphylaxis   Buspar [Buspirone] Anaphylaxis   Strawberry Extract Anaphylaxis   Milk-Related Compounds Nausea And Vomiting    Social History   Socioeconomic History   Marital status: Significant Other    Spouse name: Lennette Bihari Beam   Number of children: 3   Years of education: 13.5   Highest education level: Not on file  Occupational History   Occupation: substitute teacher  Tobacco Use   Smoking status: Never   Smokeless tobacco:  Never  Vaping Use   Vaping Use: Never used  Substance and Sexual Activity   Alcohol use: No    Alcohol/week: 0.0 standard drinks of alcohol   Drug use: No   Sexual activity: Yes    Partners: Male    Birth control/protection: None  Other Topics Concern   Not on file  Social History Narrative   Not on file   Social Determinants of Health   Financial Resource Strain: Medium Risk (05/21/2022)   Overall Financial Resource Strain (CARDIA)    Difficulty of Paying Living Expenses: Somewhat hard  Food Insecurity: No Food Insecurity (05/21/2022)   Hunger Vital Sign    Worried About Running Out of Food in the Last Year: Never true    Ran Out of Food in the Last Year: Never true  Transportation Needs: No Transportation Needs (05/21/2022)   PRAPARE - Hydrologist (Medical): No    Lack of Transportation (Non-Medical): No  Physical Activity: Insufficiently Active (05/21/2022)   Exercise Vital Sign    Days of Exercise per Week: 4 days    Minutes of Exercise per Session: 30 min  Stress: Stress Concern Present (05/21/2022)   [place]    Feeling of Stress : To some extent  Social Connections: Unknown (05/21/2022)   Social Connection and Isolation Panel [NHANES]    Frequency of Communication with Friends and Family: More than three times a week    Frequency of Social Gatherings with Friends and Family: Twice a week    Attends Religious Services: More than 4 times per year    Active Member of [employer] or Organizations: No    Attends Archivist Meetings: Never    Marital Status: Not on file  Intimate Partner Violence: Not At Risk (05/21/2022)   Humiliation, Afraid, Rape, and Kick questionnaire    Fear of Current or Ex-Partner: No    Emotionally Abused: No    Physically Abused: No    Sexually Abused: No    Family History  Problem Relation Age of Onset   Diabetes Mother    Thyroid disease  Mother    Epilepsy Mother    Hypertension Father    Sickle cell anemia Sister    Hypertension Brother    Autism spectrum disorder Brother    Hypertension Maternal Grandmother    Diabetes Maternal Grandfather    Diabetes Paternal Grandmother    Other Paternal Grandmother        problem c kidneys and liver   Hypertension Paternal Grandfather      Review of Systems  Constitutional: Negative.   HENT: Negative.    Eyes: Negative.   Respiratory: Negative.    Cardiovascular: Negative.   Gastrointestinal: Negative.   Genitourinary: Negative.   Musculoskeletal: Negative.   Skin: Negative.   Neurological:  Positive for headaches.  Endo/Heme/Allergies: Negative.   Psychiatric/Behavioral:  The patient is nervous/anxious.      Physical Exam: BP 130/68   Pulse 91   Temp 98.8 F (37.1 C) (Oral)   Resp (!) 94   Ht '5\' 4"'$  (1.626 m)   Wt 110 kg   LMP 04/13/2022 (Exact Date)   BMI 41.63 kg/m   Physical Exam Constitutional:      Appearance: Normal appearance. She is obese.  Genitourinary:     Genitourinary Comments: deferred  Cardiovascular:     Rate and Rhythm: Normal rate and regular rhythm.     Pulses: Normal pulses.     Heart sounds: Normal heart sounds.  Pulmonary:     Effort: Pulmonary effort is normal.     Breath sounds: Normal breath sounds.  Abdominal:     Comments: Gravid abdomen. High BMI 41 NST: Normal fetal heart rate baseline, with moderate variability and accels present. No decels seen.  Musculoskeletal:        General: Normal range of motion.     Cervical back: Normal range of motion and neck supple.  Neurological:     General: No focal deficit present.     Mental Status: She is alert and oriented to person, place, and time.  Skin:    General: Skin is warm and dry.  Psychiatric:     Comments: She admits to feeling very anxious     Consults: None  Significant Findings/ Diagnostic Studies: labs:  Results for orders placed or performed during the  hospital encounter of 12/15/22 (from the past 24 hour(s))  Protein / creatinine ratio, urine     Status: Abnormal   Collection Time: 12/15/22 12:28 PM  Result Value Ref Range   Creatinine, Urine 134 mg/dL   Total Protein, Urine 39 mg/dL   Protein Creatinine Ratio 0.29 (H) 0.00 - 0.15 mg/mg[Cre]  CBC     Status: Abnormal   Collection Time: 12/15/22  1:21 PM  Result Value Ref Range   WBC 6.8 4.0 - 10.5 K/uL   RBC 3.32 (L) 3.87 - 5.11 MIL/uL   Hemoglobin 9.3 (L) 12.0 - 15.0 g/dL   HCT 29.0 (L) 36.0 - 46.0 %   MCV 87.3 80.0 - 100.0 fL   MCH 28.0 26.0 - 34.0 pg   MCHC 32.1 30.0 - 36.0 g/dL   RDW 17.4 (H) 11.5 - 15.5 %   Platelets 164 150 - 400 K/uL   nRBC 0.0 0.0 - 0.2 %  Comprehensive metabolic panel     Status: Abnormal   Collection Time: 12/15/22  1:21 PM  Result Value Ref Range   Sodium 135 135 - 145 mmol/L   Potassium 3.8 3.5 - 5.1 mmol/L   Chloride 108 98 - 111 mmol/L   CO2 22 22 - 32 mmol/L   Glucose, Bld 76 70 - 99 mg/dL   BUN 5 (L) 6 - 20 mg/dL   Creatinine, Ser 0.48 0.44 - 1.00 mg/dL   Calcium 9.2 8.9 - 10.3 mg/dL   Total Protein 6.7 6.5 - 8.1 g/dL   Albumin 2.9 (L) 3.5 - 5.0 g/dL   AST 16 15 - 41 U/L   ALT 11 0 - 44 U/L   Alkaline Phosphatase 68 38 - 126 U/L   Total Bilirubin 0.5 0.3 - 1.2 mg/dL   GFR, Estimated >60 >60 mL/min   Anion gap 5 5 - 15     Procedures: EFM NST Baseline FHR: 140 beats/min Variability: moderate Accelerations: present Decelerations: absent Tocometry: no contractions noted  Interpretation:  INDICATIONS: gestational  diabetes mellitus, patient reassurance, and chronic hypertension RESULTS:  A NST procedure was performed with FHR monitoring and a normal baseline established, appropriate time of 20-40 minutes of evaluation, and accels >2 seen w 15x15 characteristics.  Results show a REACTIVE NST.    Hospital Course: The patient was admitted to Labor and Delivery Triage for observation. A PIH panel was drawn. Her urine sample resulted in  .290 proteinuria. The fetal heart tracing was Category 1. Serial blood pressures were recorded and were found to be WNL. Her other labs were all WNL with the exception of her iron counts; her H and H were9.3 and 29 respectively. With normotensive blood pressures and her lab work reassuring , she is discharged home with careful instructions to monitor her glucose levels well, contact the  office for any ongoing headaches, visual changes, and to f/u at [facility] with Dr. Amalia Hailey for her next appointment. We will plan on adding an NST to that visit.  Discharge Condition: good  Disposition: Discharge disposition: 01-Home or Self Care       Diet: Diabetic diet  Discharge Activity: Activity as tolerated  Discharge Instructions     Fetal Kick Count:  Lie on our left side for one hour after a meal, and count the number of times your baby kicks.  If it is less than 5 times, get up, move around and drink some juice.  Repeat the test 30 minutes later.  If it is still less than 5 kicks in an hour, notify your doctor.   Complete by: As directed    LABOR:  When conractions begin, you should start to time them from the beginning of one contraction to the beginning  of the next.  When contractions are 5 - 10 minutes apart or less and have been regular for at least an hour, you should call your health care provider.   Complete by: As directed    Notify physician for bleeding from the vagina   Complete by: As directed    Notify physician for blurring of vision or spots before the eyes   Complete by: As directed    Notify physician for chills or fever   Complete by: As directed    Notify physician for fainting spells, "black outs" or loss of consciousness   Complete by: As directed    Notify physician for increase in vaginal discharge   Complete by: As directed    Notify physician for leaking of fluid   Complete by: As directed    Notify physician for pain or burning when urinating   Complete by: As  directed    Notify physician for pelvic pressure (sudden increase)   Complete by: As directed    Notify physician for severe or continued nausea or vomiting   Complete by: As directed    Notify physician for sudden gushing of fluid from the vagina (with or without continued leaking)   Complete by: As directed    Notify physician for sudden, constant, or occasional abdominal pain   Complete by: As directed    Notify physician if baby moving less than usual   Complete by: As directed       Allergies as of 12/15/2022       Reactions   Albuterol Anaphylaxis   Benadryl [diphenhydramine] Anaphylaxis   Buspar [buspirone] Anaphylaxis   Strawberry Extract Anaphylaxis   Milk-related Compounds Nausea And Vomiting        Medication List     TAKE these medications  ACCRUFeR 30 MG Caps Generic drug: Ferric Maltol Take 1 capsule (30 mg total) by mouth 2 (two) times daily.   Accu-Chek Guide test strip Generic drug: glucose blood 1 each by Other route in the morning, at noon, in the evening, and at bedtime. Check blood glucose four times a day: fasting and 2 hours after breakfast, lunch, and dinner   aspirin 81 MG chewable tablet Chew 81 mg by mouth daily.   insulin glargine 100 UNIT/ML injection Commonly known as: Lantus Inject 0.44 mLs (44 Units total) into the skin at bedtime. Pt taking as of 2/29   insulin regular 100 units/mL injection Commonly known as: HumuLIN R Inject 0.14 mLs (14 Units total) into the skin 3 (three) times daily before meals.   Insulin Syringe-Needle U-100 31G X 5/16" 0.5 ML Misc Use as directed.   labetalol 200 MG tablet Commonly known as: NORMODYNE Take 1 tablet (200 mg total) by mouth 2 (two) times daily.   metoCLOPramide 10 MG tablet Commonly known as: REGLAN Take 1 tablet (10 mg total) by mouth 4 (four) times daily as needed for nausea or vomiting. What changed: when to take this   multivitamin-prenatal 27-0.8 MG Tabs tablet Take 1 tablet  by mouth daily at 12 noon.   ondansetron 4 MG disintegrating tablet Commonly known as: ZOFRAN-ODT Take 1 tablet (4 mg total) by mouth every 6 (six) hours as needed for nausea.         Total time spent taking care of this patient: 45 minutes  Signed: Imagene Riches, CNM  12/15/2022, 2:17 PM

## 2022-12-15 NOTE — Progress Notes (Signed)
Pt had MFM appt today at [facility].  Elevated BP's.  Sent to OBS 2 in [facility] for observation and Lilly labs via wheelchair by RN.  Pt's sig other grandmother accompanied pt in a separate wheelchair by volunteer services.  Report given to [facility] prior to leaving the floor.

## 2022-12-16 ENCOUNTER — Encounter: Payer: Self-pay | Admitting: Obstetrics and Gynecology

## 2022-12-16 ENCOUNTER — Other Ambulatory Visit: Payer: Medicaid Other

## 2022-12-16 LAB — URINE CULTURE

## 2022-12-16 LAB — RPR: RPR Ser Ql: NONREACTIVE

## 2022-12-18 ENCOUNTER — Other Ambulatory Visit: Payer: Self-pay | Admitting: Licensed Practical Nurse

## 2022-12-18 LAB — CULTURE, BETA STREP (GROUP B ONLY): Strep Gp B Culture: NEGATIVE

## 2022-12-18 NOTE — Progress Notes (Signed)
Pt's hemoglobin 9.3. Attempted to place iron transfusion at same day surgery. Unable to do so at this time.  Roberto Scales, [facility]  12/18/22  4:16 PM

## 2022-12-20 ENCOUNTER — Ambulatory Visit (INDEPENDENT_AMBULATORY_CARE_PROVIDER_SITE_OTHER): Payer: Medicaid Other

## 2022-12-20 ENCOUNTER — Other Ambulatory Visit: Payer: Self-pay

## 2022-12-20 VITALS — BP 141/83 | HR 96 | Ht 64.0 in | Wt 242.2 lb

## 2022-12-20 DIAGNOSIS — O10913 Unspecified pre-existing hypertension complicating pregnancy, third trimester: Secondary | ICD-10-CM

## 2022-12-20 DIAGNOSIS — O10013 Pre-existing essential hypertension complicating pregnancy, third trimester: Secondary | ICD-10-CM | POA: Diagnosis not present

## 2022-12-20 DIAGNOSIS — E119 Type 2 diabetes mellitus without complications: Secondary | ICD-10-CM | POA: Diagnosis not present

## 2022-12-20 DIAGNOSIS — Z98891 History of uterine scar from previous surgery: Secondary | ICD-10-CM

## 2022-12-20 DIAGNOSIS — O99213 Obesity complicating pregnancy, third trimester: Secondary | ICD-10-CM | POA: Diagnosis not present

## 2022-12-20 DIAGNOSIS — Z794 Long term (current) use of insulin: Secondary | ICD-10-CM

## 2022-12-20 DIAGNOSIS — O24113 Pre-existing diabetes mellitus, type 2, in pregnancy, third trimester: Secondary | ICD-10-CM | POA: Diagnosis not present

## 2022-12-20 DIAGNOSIS — E669 Obesity, unspecified: Secondary | ICD-10-CM

## 2022-12-20 DIAGNOSIS — Z3A35 35 weeks gestation of pregnancy: Secondary | ICD-10-CM | POA: Insufficient documentation

## 2022-12-20 DIAGNOSIS — O24414 Gestational diabetes mellitus in pregnancy, insulin controlled: Secondary | ICD-10-CM

## 2022-12-20 DIAGNOSIS — O09293 Supervision of pregnancy with other poor reproductive or obstetric history, third trimester: Secondary | ICD-10-CM

## 2022-12-20 NOTE — Patient Instructions (Signed)

## 2022-12-20 NOTE — Progress Notes (Signed)
    NURSE VISIT NOTE  Subjective:    Patient ID: Erika Solomon, female    DOB: Apr 28, 1996, 27 y.o.   MRN: [id number]  HPI  Patient is a 27 y.o. [id number] female who presents for fetal monitoring per order from Gigi Gin, CNM.   Objective:    BP (!) 144/85   Pulse 90   Ht 5\' 4"  (1.626 m)   Wt 242 lb 3.2 oz (109.9 kg)   LMP 04/13/2022 (Exact Date)   BMI 41.57 kg/m  Estimated Date of Delivery: 01/18/23  [redacted]w[redacted]d  Fetus A Non-Stress Test Interpretation for 12/20/22  Indication: Chronic Hypertenstion, Diabetes, and Obesity  Fetal Heart Rate A Mode: External Baseline Rate (A): 135 bpm Variability: Moderate Accelerations: 15 x 15 Decelerations: Variable Multiple birth?: No  Uterine Activity Mode: Toco Contraction Frequency (min): None  Interpretation (Fetal Testing) Nonstress Test Interpretation: Reactive Overall Impression: Reassuring for gestational age   Assessment:   1. Pre-existing type 2 diabetes mellitus during pregnancy in third trimester   2. Maternal chronic hypertension, third trimester   3. Obesity affecting pregnancy in third trimester, unspecified obesity type   4. [redacted] weeks gestation of pregnancy      Plan:   Results reviewed and discussed with patient by  Rod Can, CNM.     Otila Kluver, LPN

## 2022-12-21 ENCOUNTER — Encounter: Payer: Self-pay | Admitting: Obstetrics and Gynecology

## 2022-12-21 ENCOUNTER — Ambulatory Visit (INDEPENDENT_AMBULATORY_CARE_PROVIDER_SITE_OTHER): Payer: Medicaid Other | Admitting: Obstetrics and Gynecology

## 2022-12-21 VITALS — BP 129/88 | HR 109 | Wt 244.7 lb

## 2022-12-21 DIAGNOSIS — E119 Type 2 diabetes mellitus without complications: Secondary | ICD-10-CM

## 2022-12-21 DIAGNOSIS — O34219 Maternal care for unspecified type scar from previous cesarean delivery: Secondary | ICD-10-CM

## 2022-12-21 DIAGNOSIS — O24113 Pre-existing diabetes mellitus, type 2, in pregnancy, third trimester: Secondary | ICD-10-CM

## 2022-12-21 DIAGNOSIS — Z794 Long term (current) use of insulin: Secondary | ICD-10-CM

## 2022-12-21 DIAGNOSIS — O2343 Unspecified infection of urinary tract in pregnancy, third trimester: Secondary | ICD-10-CM

## 2022-12-21 DIAGNOSIS — Z3A36 36 weeks gestation of pregnancy: Secondary | ICD-10-CM

## 2022-12-21 DIAGNOSIS — Z3685 Encounter for antenatal screening for Streptococcus B: Secondary | ICD-10-CM

## 2022-12-21 DIAGNOSIS — O10013 Pre-existing essential hypertension complicating pregnancy, third trimester: Secondary | ICD-10-CM

## 2022-12-21 DIAGNOSIS — O0990 Supervision of high risk pregnancy, unspecified, unspecified trimester: Secondary | ICD-10-CM

## 2022-12-21 DIAGNOSIS — Z113 Encounter for screening for infections with a predominantly sexual mode of transmission: Secondary | ICD-10-CM

## 2022-12-21 DIAGNOSIS — O10919 Unspecified pre-existing hypertension complicating pregnancy, unspecified trimester: Secondary | ICD-10-CM

## 2022-12-21 LAB — POCT URINALYSIS DIPSTICK OB
Bilirubin, UA: NEGATIVE
Ketones, UA: NEGATIVE
Nitrite, UA: NEGATIVE
Spec Grav, UA: 1.015 (ref 1.010–1.025)
Urobilinogen, UA: 0.2 E.U./dL
pH, UA: 7 (ref 5.0–8.0)

## 2022-12-21 MED ORDER — NITROFURANTOIN MONOHYD MACRO 100 MG PO CAPS: 100.0000 mg | ORAL_CAPSULE | Freq: Two times a day (BID) | ORAL | 0 refills | Status: DC

## 2022-12-21 NOTE — Progress Notes (Signed)
ROB: Her sugars remain poorly controlled.  Some days on the same dose of insulin she will have a sugar that is 111 followed by the same time the next day 211.  She reports that this does not seem to be diet dependent. Today she has reaffirmed her desire for repeat cesarean delivery.  MFM is seeing her and has also recommended cesarean delivery at 37 weeks for poorly controlled diabetes and history of shoulder dystocia as well as suspected LGA infant.  Also of significant note, baby is currently breech.  Erika Solomon is undergoing twice-weekly testing with ultrasound BPP and NSTs.  Cesarean delivery scheduled for 3/29.  Urine today likely consistent with UTI.  Macrobid prescribed.

## 2022-12-22 ENCOUNTER — Encounter: Payer: Self-pay | Admitting: Advanced Practice Midwife

## 2022-12-22 ENCOUNTER — Other Ambulatory Visit: Payer: Self-pay

## 2022-12-22 ENCOUNTER — Ambulatory Visit: Payer: Medicaid Other | Attending: Maternal & Fetal Medicine

## 2022-12-22 VITALS — HR 89 | Temp 98.1°F | Ht 64.0 in | Wt 243.5 lb

## 2022-12-22 DIAGNOSIS — O09293 Supervision of pregnancy with other poor reproductive or obstetric history, third trimester: Secondary | ICD-10-CM | POA: Insufficient documentation

## 2022-12-22 DIAGNOSIS — E669 Obesity, unspecified: Secondary | ICD-10-CM | POA: Diagnosis not present

## 2022-12-22 DIAGNOSIS — O10013 Pre-existing essential hypertension complicating pregnancy, third trimester: Secondary | ICD-10-CM

## 2022-12-22 DIAGNOSIS — Z3A36 36 weeks gestation of pregnancy: Secondary | ICD-10-CM | POA: Insufficient documentation

## 2022-12-22 DIAGNOSIS — O418X30 Other specified disorders of amniotic fluid and membranes, third trimester, not applicable or unspecified: Secondary | ICD-10-CM | POA: Diagnosis not present

## 2022-12-22 DIAGNOSIS — Z794 Long term (current) use of insulin: Secondary | ICD-10-CM

## 2022-12-22 DIAGNOSIS — O0990 Supervision of high risk pregnancy, unspecified, unspecified trimester: Secondary | ICD-10-CM

## 2022-12-22 DIAGNOSIS — O3663X0 Maternal care for excessive fetal growth, third trimester, not applicable or unspecified: Secondary | ICD-10-CM | POA: Diagnosis not present

## 2022-12-22 DIAGNOSIS — Z98891 History of uterine scar from previous surgery: Secondary | ICD-10-CM

## 2022-12-22 DIAGNOSIS — E119 Type 2 diabetes mellitus without complications: Secondary | ICD-10-CM | POA: Insufficient documentation

## 2022-12-22 DIAGNOSIS — O34219 Maternal care for unspecified type scar from previous cesarean delivery: Secondary | ICD-10-CM

## 2022-12-22 DIAGNOSIS — O24113 Pre-existing diabetes mellitus, type 2, in pregnancy, third trimester: Secondary | ICD-10-CM | POA: Diagnosis not present

## 2022-12-22 DIAGNOSIS — O99213 Obesity complicating pregnancy, third trimester: Secondary | ICD-10-CM | POA: Insufficient documentation

## 2022-12-22 DIAGNOSIS — O24414 Gestational diabetes mellitus in pregnancy, insulin controlled: Secondary | ICD-10-CM

## 2022-12-22 DIAGNOSIS — O42913 Preterm premature rupture of membranes, unspecified as to length of time between rupture and onset of labor, third trimester: Secondary | ICD-10-CM | POA: Insufficient documentation

## 2022-12-22 DIAGNOSIS — O10913 Unspecified pre-existing hypertension complicating pregnancy, third trimester: Secondary | ICD-10-CM | POA: Insufficient documentation

## 2022-12-23 ENCOUNTER — Other Ambulatory Visit: Payer: Medicaid Other

## 2022-12-23 NOTE — Telephone Encounter (Signed)
Patient seen in office

## 2022-12-24 ENCOUNTER — Encounter
Admission: RE | Admit: 2022-12-24 | Discharge: 2022-12-24 | Disposition: A | Discharge status: Home / Self Care | Payer: Medicaid Other | Source: Ambulatory Visit | Attending: Obstetrics and Gynecology | Admitting: Obstetrics and Gynecology

## 2022-12-24 ENCOUNTER — Other Ambulatory Visit: Payer: Self-pay

## 2022-12-24 VITALS — Ht 64.0 in | Wt 243.0 lb

## 2022-12-24 DIAGNOSIS — O10913 Unspecified pre-existing hypertension complicating pregnancy, third trimester: Secondary | ICD-10-CM

## 2022-12-24 DIAGNOSIS — Z3A36 36 weeks gestation of pregnancy: Secondary | ICD-10-CM | POA: Insufficient documentation

## 2022-12-24 DIAGNOSIS — O10919 Unspecified pre-existing hypertension complicating pregnancy, unspecified trimester: Secondary | ICD-10-CM

## 2022-12-24 DIAGNOSIS — O99013 Anemia complicating pregnancy, third trimester: Secondary | ICD-10-CM

## 2022-12-24 HISTORY — DX: Unspecified asthma, uncomplicated: J45.909

## 2022-12-24 HISTORY — DX: Essential (primary) hypertension: I10

## 2022-12-24 NOTE — Patient Instructions (Addendum)
Your procedure is scheduled on: Friday 12/31/22    Report to the Registration Desk on the 1st floor of the [facility] at 6:00 am. [facility] [employer] parking is available.  If your arrival time is 6:00 am, do not arrive before that time as the [place] entrance doors do not open until 6:00 am.  REMEMBER: Instructions that are not followed completely may result in serious medical risk, up to and including death; or upon the discretion of your surgeon and anesthesiologist your surgery may need to be rescheduled.  Do not eat food or drink any liquids after midnight the night before surgery.  No gum chewing or hard candies.  One week prior to surgery: Stop Anti-inflammatories (NSAIDS) such as Advil, Aleve, Ibuprofen, Motrin, Naproxen, Naprosyn and Aspirin based products such as Excedrin, Goody's Powder, BC Powder. You may however, continue to take Tylenol if needed for pain up until the day of surgery.  Stop ANY OVER THE COUNTER supplements until after surgery.  Continue taking all prescribed medications .  Decrease your bedtime Lantus to 22 units the Thursday night 12/30/22 only. No insulin before arrival on Friday 12/31/22.  TAKE ONLY THESE MEDICATIONS THE MORNING OF SURGERY WITH A SIP OF WATER:  labetalol (NORMODYNE) 200 MG tablet   No Alcohol for 24 hours before or after surgery.  No Smoking including e-cigarettes for 24 hours before surgery.  No chewable tobacco products for at least 6 hours before surgery.  No nicotine patches on the day of surgery.  Do not use any "recreational" drugs for at least a week (preferably 2 weeks) before your surgery.  Please be advised that the combination of cocaine and anesthesia may have negative outcomes, up to and including death. If you test positive for cocaine, your surgery will be cancelled.  On the morning of surgery brush your teeth with toothpaste and water, you may rinse your mouth with mouthwash if you wish. Do not swallow any toothpaste or  mouthwash.  Use CHG Soap or wipes as directed on instruction sheet.  Do not wear lotions, powders, or perfumes.   Do not shave body hair from the neck down 48 hours before surgery.  Wear comfortable clothing (specific to your surgery type) to the hospital.  Do not wear jewelry, make-up, hairpins, clips or nail polish.  Contact lenses, hearing aids and dentures may not be worn into surgery.  Do not bring valuables to the hospital. [facility] is not responsible for any missing/lost belongings or valuables.   Notify your doctor if there is any change in your medical condition (cold, fever, infection).  If you are being discharged the day of surgery, you will not be allowed to drive home. You will need a responsible individual to drive you home and stay with you for 24 hours after surgery.   If you are taking public transportation, you will need to have a responsible individual with you.  If you are being admitted to the hospital overnight, leave your suitcase in the car. After surgery it may be brought to your room.  In case of increased patient census, it may be necessary for you, the patient, to continue your postoperative care in the Same Day Surgery department.  After surgery, you can help prevent lung complications by doing breathing exercises.  Take deep breaths and cough every 1-2 hours. Your doctor may order a device called an Incentive Spirometer to help you take deep breaths. When coughing or sneezing, hold a pillow firmly against your incision with both  hands. This is called "splinting." Doing this helps protect your incision. It also decreases belly discomfort.  Surgery Visitation Policy:  Patients undergoing a surgery or procedure may have two family members or support persons with them as long as the person is not COVID-19 positive or experiencing its symptoms.   Inpatient Visitation:    Visiting hours are 7 a.m. to 8 p.m. Up to four visitors are allowed at one time  in a patient room. The visitors may rotate out with other people during the day. One designated support person (adult) may remain overnight.  Please call the [facility] at [phone] if you have any questions about these instructions or visitation policy.     Preparing for Surgery with CHLORHEXIDINE GLUCONATE (CHG) Soap  Chlorhexidine Gluconate (CHG) Soap  o An antiseptic cleaner that kills germs and bonds with the skin to continue killing germs even after washing  o Used for showering the night before surgery and morning of surgery  Before surgery, you can play an important role by reducing the number of germs on your skin.  CHG (Chlorhexidine gluconate) soap is an antiseptic cleanser which kills germs and bonds with the skin to continue killing germs even after washing.  Please do not use if you have an allergy to CHG or antibacterial soaps. If your skin becomes reddened/irritated stop using the CHG.  1. Shower the NIGHT BEFORE SURGERY and the MORNING OF SURGERY with CHG soap.  2. If you choose to wash your hair, wash your hair first as usual with your normal shampoo.  3. After shampooing, rinse your hair and body thoroughly to remove the shampoo.  4. Use CHG as you would any other liquid soap. You can apply CHG directly to the skin and wash gently with a scrungie or a clean washcloth.  5. Apply the CHG soap to your body only from the neck down. Do not use on open wounds or open sores. Avoid contact with your eyes, ears, mouth, breasts (if planning to breastfeed) and genitals (private parts). Wash face, breasts and genitals (private parts) with your normal soap.  6. Wash thoroughly, paying special attention to the area where your surgery will be performed.  7. Thoroughly rinse your body with warm water.  8. Do not shower/wash with your normal soap after using and rinsing off the CHG soap.  9. Pat yourself dry with a clean towel.  10. Wear clean pajamas to bed the night  before surgery.  12. Place clean sheets on your bed the night of your first shower and do not sleep with pets.  13. Shower again with the CHG soap on the day of surgery prior to arriving at the hospital.  14. Do not apply any deodorants/lotions/powders.  15. Please wear clean clothes to the hospital.

## 2022-12-24 NOTE — Patient Instructions (Signed)

## 2022-12-24 NOTE — Progress Notes (Unsigned)
    NURSE VISIT NOTE  Subjective:    Patient ID: Erika Solomon, female    DOB: 10-15-1995, 27 y.o.   MRN: [id number]  HPI  Patient is a 27 y.o. G35P3003 female who presents for fetal monitoring per order from Jeannie Fend, MD.   Objective:    LMP 04/13/2022 (Exact Date)  Estimated Date of Delivery: 01/18/23  [redacted]w[redacted]d  Fetus A Non-Stress Test Interpretation for 12/24/22  Indication: Chronic Hypertenstion and Gestational Diabetes medication controlled            Assessment:   1. Pre-existing type 2 diabetes mellitus during pregnancy in third trimester   2. Maternal chronic hypertension, third trimester   3. [redacted] weeks gestation of pregnancy      Plan:   Results reviewed and discussed with patient by  {[email or web address]:[id number]}.     Otila Kluver, LPN

## 2022-12-27 ENCOUNTER — Other Ambulatory Visit: Payer: Self-pay

## 2022-12-27 ENCOUNTER — Ambulatory Visit (INDEPENDENT_AMBULATORY_CARE_PROVIDER_SITE_OTHER): Payer: Medicaid Other

## 2022-12-27 VITALS — BP 123/85 | HR 105 | Ht 64.0 in | Wt 242.6 lb

## 2022-12-27 DIAGNOSIS — Z98891 History of uterine scar from previous surgery: Secondary | ICD-10-CM

## 2022-12-27 DIAGNOSIS — E119 Type 2 diabetes mellitus without complications: Secondary | ICD-10-CM | POA: Diagnosis not present

## 2022-12-27 DIAGNOSIS — O10913 Unspecified pre-existing hypertension complicating pregnancy, third trimester: Secondary | ICD-10-CM

## 2022-12-27 DIAGNOSIS — O10013 Pre-existing essential hypertension complicating pregnancy, third trimester: Secondary | ICD-10-CM | POA: Diagnosis not present

## 2022-12-27 DIAGNOSIS — O09293 Supervision of pregnancy with other poor reproductive or obstetric history, third trimester: Secondary | ICD-10-CM

## 2022-12-27 DIAGNOSIS — O99213 Obesity complicating pregnancy, third trimester: Secondary | ICD-10-CM

## 2022-12-27 DIAGNOSIS — Z794 Long term (current) use of insulin: Secondary | ICD-10-CM

## 2022-12-27 DIAGNOSIS — O24113 Pre-existing diabetes mellitus, type 2, in pregnancy, third trimester: Secondary | ICD-10-CM | POA: Diagnosis not present

## 2022-12-27 DIAGNOSIS — Z3A36 36 weeks gestation of pregnancy: Secondary | ICD-10-CM | POA: Diagnosis not present

## 2022-12-27 DIAGNOSIS — O24414 Gestational diabetes mellitus in pregnancy, insulin controlled: Secondary | ICD-10-CM

## 2022-12-28 ENCOUNTER — Encounter: Payer: Self-pay | Admitting: Obstetrics

## 2022-12-29 ENCOUNTER — Encounter
Admission: RE | Admit: 2022-12-29 | Discharge: 2022-12-29 | Disposition: A | Discharge status: Home / Self Care | Payer: Medicaid Other | Source: Ambulatory Visit | Attending: Obstetrics and Gynecology | Admitting: Obstetrics and Gynecology

## 2022-12-29 ENCOUNTER — Other Ambulatory Visit: Payer: Self-pay

## 2022-12-29 ENCOUNTER — Ambulatory Visit (HOSPITAL_BASED_OUTPATIENT_CLINIC_OR_DEPARTMENT_OTHER): Payer: Medicaid Other

## 2022-12-29 DIAGNOSIS — O10919 Unspecified pre-existing hypertension complicating pregnancy, unspecified trimester: Secondary | ICD-10-CM

## 2022-12-29 DIAGNOSIS — Z01810 Encounter for preprocedural cardiovascular examination: Secondary | ICD-10-CM | POA: Diagnosis not present

## 2022-12-29 DIAGNOSIS — O3663X0 Maternal care for excessive fetal growth, third trimester, not applicable or unspecified: Secondary | ICD-10-CM | POA: Diagnosis not present

## 2022-12-29 DIAGNOSIS — Z794 Long term (current) use of insulin: Secondary | ICD-10-CM

## 2022-12-29 DIAGNOSIS — O24113 Pre-existing diabetes mellitus, type 2, in pregnancy, third trimester: Secondary | ICD-10-CM | POA: Insufficient documentation

## 2022-12-29 DIAGNOSIS — O10013 Pre-existing essential hypertension complicating pregnancy, third trimester: Secondary | ICD-10-CM

## 2022-12-29 DIAGNOSIS — E669 Obesity, unspecified: Secondary | ICD-10-CM

## 2022-12-29 DIAGNOSIS — O09293 Supervision of pregnancy with other poor reproductive or obstetric history, third trimester: Secondary | ICD-10-CM | POA: Diagnosis not present

## 2022-12-29 DIAGNOSIS — O99214 Obesity complicating childbirth: Secondary | ICD-10-CM | POA: Insufficient documentation

## 2022-12-29 DIAGNOSIS — O4292 Full-term premature rupture of membranes, unspecified as to length of time between rupture and onset of labor: Secondary | ICD-10-CM | POA: Insufficient documentation

## 2022-12-29 DIAGNOSIS — Z98891 History of uterine scar from previous surgery: Secondary | ICD-10-CM

## 2022-12-29 DIAGNOSIS — O0990 Supervision of high risk pregnancy, unspecified, unspecified trimester: Secondary | ICD-10-CM

## 2022-12-29 DIAGNOSIS — O10913 Unspecified pre-existing hypertension complicating pregnancy, third trimester: Secondary | ICD-10-CM | POA: Insufficient documentation

## 2022-12-29 DIAGNOSIS — Z3A37 37 weeks gestation of pregnancy: Secondary | ICD-10-CM | POA: Insufficient documentation

## 2022-12-29 DIAGNOSIS — Z01818 Encounter for other preprocedural examination: Secondary | ICD-10-CM | POA: Insufficient documentation

## 2022-12-29 DIAGNOSIS — E119 Type 2 diabetes mellitus without complications: Secondary | ICD-10-CM | POA: Diagnosis not present

## 2022-12-29 DIAGNOSIS — O34219 Maternal care for unspecified type scar from previous cesarean delivery: Secondary | ICD-10-CM

## 2022-12-29 DIAGNOSIS — O24414 Gestational diabetes mellitus in pregnancy, insulin controlled: Secondary | ICD-10-CM

## 2022-12-29 DIAGNOSIS — O99013 Anemia complicating pregnancy, third trimester: Secondary | ICD-10-CM

## 2022-12-29 DIAGNOSIS — Z3A36 36 weeks gestation of pregnancy: Secondary | ICD-10-CM

## 2022-12-29 DIAGNOSIS — O99213 Obesity complicating pregnancy, third trimester: Secondary | ICD-10-CM

## 2022-12-29 LAB — TYPE AND SCREEN
ABO/RH(D): A POS
Antibody Screen: NEGATIVE
Extend sample reason: UNDETERMINED

## 2022-12-30 ENCOUNTER — Other Ambulatory Visit: Payer: Medicaid Other

## 2022-12-30 MED ORDER — LACTATED RINGERS IV SOLN: INTRAVENOUS | Status: DC

## 2022-12-30 MED ORDER — LACTATED RINGERS IV BOLUS
1000.0000 mL | Freq: Once | INTRAVENOUS | Status: AC
  Administered 2022-12-31: 1000 mL via INTRAVENOUS

## 2022-12-31 ENCOUNTER — Encounter: Payer: Self-pay | Admitting: Obstetrics and Gynecology

## 2022-12-31 ENCOUNTER — Inpatient Hospital Stay: Payer: Medicaid Other | Admitting: Certified Registered Nurse Anesthetist

## 2022-12-31 ENCOUNTER — Inpatient Hospital Stay
Admission: RE | Admit: 2022-12-31 | Discharge: 2023-01-02 | DRG: 786 | Disposition: A | Discharge status: Home / Self Care | Payer: Medicaid Other | Source: Ambulatory Visit | Attending: Obstetrics and Gynecology | Admitting: Obstetrics and Gynecology

## 2022-12-31 ENCOUNTER — Other Ambulatory Visit: Payer: Self-pay

## 2022-12-31 ENCOUNTER — Encounter
Admission: RE | Disposition: A | Discharge status: Home / Self Care | Payer: Self-pay | Source: Ambulatory Visit | Attending: Obstetrics and Gynecology

## 2022-12-31 DIAGNOSIS — O24113 Pre-existing diabetes mellitus, type 2, in pregnancy, third trimester: Secondary | ICD-10-CM | POA: Diagnosis not present

## 2022-12-31 DIAGNOSIS — Z3A37 37 weeks gestation of pregnancy: Secondary | ICD-10-CM | POA: Diagnosis not present

## 2022-12-31 DIAGNOSIS — E1165 Type 2 diabetes mellitus with hyperglycemia: Secondary | ICD-10-CM | POA: Diagnosis present

## 2022-12-31 DIAGNOSIS — O34211 Maternal care for low transverse scar from previous cesarean delivery: Secondary | ICD-10-CM | POA: Diagnosis present

## 2022-12-31 DIAGNOSIS — Z3490 Encounter for supervision of normal pregnancy, unspecified, unspecified trimester: Secondary | ICD-10-CM

## 2022-12-31 DIAGNOSIS — O99214 Obesity complicating childbirth: Secondary | ICD-10-CM | POA: Diagnosis present

## 2022-12-31 DIAGNOSIS — O10913 Unspecified pre-existing hypertension complicating pregnancy, third trimester: Secondary | ICD-10-CM

## 2022-12-31 DIAGNOSIS — Z794 Long term (current) use of insulin: Secondary | ICD-10-CM

## 2022-12-31 DIAGNOSIS — Z7982 Long term (current) use of aspirin: Secondary | ICD-10-CM

## 2022-12-31 DIAGNOSIS — O99213 Obesity complicating pregnancy, third trimester: Secondary | ICD-10-CM

## 2022-12-31 DIAGNOSIS — O0990 Supervision of high risk pregnancy, unspecified, unspecified trimester: Secondary | ICD-10-CM

## 2022-12-31 DIAGNOSIS — E119 Type 2 diabetes mellitus without complications: Secondary | ICD-10-CM | POA: Diagnosis not present

## 2022-12-31 DIAGNOSIS — O2412 Pre-existing diabetes mellitus, type 2, in childbirth: Secondary | ICD-10-CM | POA: Diagnosis present

## 2022-12-31 DIAGNOSIS — O09293 Supervision of pregnancy with other poor reproductive or obstetric history, third trimester: Secondary | ICD-10-CM

## 2022-12-31 DIAGNOSIS — E669 Obesity, unspecified: Secondary | ICD-10-CM | POA: Diagnosis not present

## 2022-12-31 DIAGNOSIS — Z23 Encounter for immunization: Secondary | ICD-10-CM | POA: Diagnosis not present

## 2022-12-31 DIAGNOSIS — O3663X0 Maternal care for excessive fetal growth, third trimester, not applicable or unspecified: Secondary | ICD-10-CM | POA: Diagnosis present

## 2022-12-31 DIAGNOSIS — O1002 Pre-existing essential hypertension complicating childbirth: Secondary | ICD-10-CM | POA: Diagnosis present

## 2022-12-31 LAB — GLUCOSE, CAPILLARY
Glucose-Capillary: 94 mg/dL (ref 70–99)
Glucose-Capillary: 104 mg/dL — ABNORMAL HIGH (ref 70–99)
Glucose-Capillary: 128 mg/dL — ABNORMAL HIGH (ref 70–99)

## 2022-12-31 LAB — CBC WITH DIFFERENTIAL/PLATELET
Abs Immature Granulocytes: 0.09 10*3/uL — ABNORMAL HIGH (ref 0.00–0.07)
Basophils Absolute: 0 10*3/uL (ref 0.0–0.1)
Basophils Relative: 0 %
Eosinophils Absolute: 0 10*3/uL (ref 0.0–0.5)
Eosinophils Relative: 1 %
HCT: 29.9 % — ABNORMAL LOW (ref 36.0–46.0)
Hemoglobin: 9.8 g/dL — ABNORMAL LOW (ref 12.0–15.0)
Immature Granulocytes: 1 %
Lymphocytes Relative: 17 %
Lymphs Abs: 1.1 10*3/uL (ref 0.7–4.0)
MCH: 28.3 pg (ref 26.0–34.0)
MCHC: 32.8 g/dL (ref 30.0–36.0)
MCV: 86.4 fL (ref 80.0–100.0)
Monocytes Absolute: 0.6 10*3/uL (ref 0.1–1.0)
Monocytes Relative: 8 %
Neutro Abs: 4.7 10*3/uL (ref 1.7–7.7)
Neutrophils Relative %: 73 %
Platelets: 205 10*3/uL (ref 150–400)
RBC: 3.46 MIL/uL — ABNORMAL LOW (ref 3.87–5.11)
RDW: 17.8 % — ABNORMAL HIGH (ref 11.5–15.5)
WBC: 6.5 10*3/uL (ref 4.0–10.5)
nRBC: 0 % (ref 0.0–0.2)

## 2022-12-31 SURGERY — Surgical Case
Anesthesia: Spinal

## 2022-12-31 MED ORDER — IBUPROFEN 600 MG PO TABS
600.0000 mg | ORAL_TABLET | Freq: Four times a day (QID) | ORAL | Status: DC
  Administered 2022-12-31 – 2023-01-02 (×9): 600 mg via ORAL
  Filled 2022-12-31 (×9): qty 1

## 2022-12-31 MED ORDER — CEFAZOLIN SODIUM-DEXTROSE 2-4 GM/100ML-% IV SOLN
2.0000 g | INTRAVENOUS | Status: AC
  Administered 2022-12-31: 2 g via INTRAVENOUS

## 2022-12-31 MED ORDER — LIDOCAINE HCL (PF) 1 % IJ SOLN
INTRAMUSCULAR | Status: DC | PRN
  Administered 2022-12-31: 2 mL via SUBCUTANEOUS

## 2022-12-31 MED ORDER — SODIUM CHLORIDE 0.9% FLUSH: 3.0000 mL | INTRAVENOUS | Status: DC | PRN

## 2022-12-31 MED ORDER — SOD CITRATE-CITRIC ACID 500-334 MG/5ML PO SOLN
ORAL | Status: AC
  Administered 2022-12-31: 30 mL
  Filled 2022-12-31: qty 15

## 2022-12-31 MED ORDER — MORPHINE SULFATE (PF) 0.5 MG/ML IJ SOLN
INTRAMUSCULAR | Status: AC
  Filled 2022-12-31: qty 10

## 2022-12-31 MED ORDER — OXYTOCIN-SODIUM CHLORIDE 30-0.9 UT/500ML-% IV SOLN
2.5000 [IU]/h | INTRAVENOUS | Status: AC
  Administered 2022-12-31: 2.5 [IU]/h via INTRAVENOUS
  Filled 2022-12-31: qty 500

## 2022-12-31 MED ORDER — MEPERIDINE HCL 25 MG/ML IJ SOLN: 6.2500 mg | INTRAMUSCULAR | Status: DC | PRN

## 2022-12-31 MED ORDER — DROPERIDOL 2.5 MG/ML IJ SOLN
0.6250 mg | Freq: Once | INTRAMUSCULAR | Status: AC
  Administered 2022-12-31: 0.625 mg via INTRAVENOUS
  Filled 2022-12-31: qty 2

## 2022-12-31 MED ORDER — SIMETHICONE 80 MG PO CHEW
80.0000 mg | CHEWABLE_TABLET | Freq: Four times a day (QID) | ORAL | Status: DC
  Administered 2022-12-31 – 2023-01-02 (×7): 80 mg via ORAL
  Filled 2022-12-31 (×7): qty 1

## 2022-12-31 MED ORDER — MENTHOL 3 MG MT LOZG: 1.0000 | LOZENGE | OROMUCOSAL | Status: DC | PRN

## 2022-12-31 MED ORDER — OXYTOCIN-SODIUM CHLORIDE 30-0.9 UT/500ML-% IV SOLN
INTRAVENOUS | Status: DC | PRN
  Administered 2022-12-31: 400 mL via INTRAVENOUS

## 2022-12-31 MED ORDER — BUPIVACAINE IN DEXTROSE 0.75-8.25 % IT SOLN
INTRATHECAL | Status: DC | PRN
  Administered 2022-12-31: 1.6 mL via INTRATHECAL

## 2022-12-31 MED ORDER — INSULIN DETEMIR 100 UNIT/ML ~~LOC~~ SOLN
30.0000 [IU] | Freq: Every day | SUBCUTANEOUS | Status: DC
  Administered 2022-12-31: 30 [IU] via SUBCUTANEOUS
  Filled 2022-12-31 (×4): qty 0.3

## 2022-12-31 MED ORDER — ONDANSETRON HCL 4 MG/2ML IJ SOLN
INTRAMUSCULAR | Status: DC | PRN
  Administered 2022-12-31: 4 mg via INTRAVENOUS

## 2022-12-31 MED ORDER — PRENATAL MULTIVITAMIN CH
1.0000 | ORAL_TABLET | Freq: Every day | ORAL | Status: DC
  Administered 2022-12-31 – 2023-01-02 (×3): 1 via ORAL
  Filled 2022-12-31 (×3): qty 1

## 2022-12-31 MED ORDER — FENTANYL CITRATE (PF) 100 MCG/2ML IJ SOLN
INTRAMUSCULAR | Status: AC
  Filled 2022-12-31: qty 2

## 2022-12-31 MED ORDER — POVIDONE-IODINE 10 % EX SWAB: 2.0000 | Freq: Once | CUTANEOUS | Status: DC

## 2022-12-31 MED ORDER — INSULIN ASPART 100 UNIT/ML IJ SOLN
0.0000 [IU] | INTRAMUSCULAR | Status: DC
  Administered 2022-12-31 – 2023-01-01 (×2): 3 [IU] via SUBCUTANEOUS
  Filled 2022-12-31 (×2): qty 1

## 2022-12-31 MED ORDER — PHENYLEPHRINE HCL-NACL 20-0.9 MG/250ML-% IV SOLN
INTRAVENOUS | Status: DC | PRN
  Administered 2022-12-31: 50 ug/min via INTRAVENOUS

## 2022-12-31 MED ORDER — LACTATED RINGERS IV SOLN: INTRAVENOUS | Status: DC

## 2022-12-31 MED ORDER — PHENYLEPHRINE HCL-NACL 20-0.9 MG/250ML-% IV SOLN
INTRAVENOUS | Status: AC
  Filled 2022-12-31: qty 250

## 2022-12-31 MED ORDER — FENTANYL CITRATE (PF) 100 MCG/2ML IJ SOLN
INTRAMUSCULAR | Status: DC | PRN
  Administered 2022-12-31: 15 ug via INTRATHECAL

## 2022-12-31 MED ORDER — NALOXONE HCL 0.4 MG/ML IJ SOLN: 0.4000 mg | INTRAMUSCULAR | Status: DC | PRN

## 2022-12-31 MED ORDER — OXYCODONE HCL 5 MG PO TABS: 5.0000 mg | ORAL_TABLET | Freq: Four times a day (QID) | ORAL | Status: DC | PRN

## 2022-12-31 MED ORDER — NALOXONE HCL 4 MG/10ML IJ SOLN: 1.0000 ug/kg/h | INTRAVENOUS | Status: DC | PRN

## 2022-12-31 MED ORDER — OXYCODONE-ACETAMINOPHEN 5-325 MG PO TABS
1.0000 | ORAL_TABLET | ORAL | Status: DC | PRN
  Administered 2023-01-01 – 2023-01-02 (×5): 1 via ORAL
  Filled 2022-12-31 (×5): qty 1

## 2022-12-31 MED ORDER — ONDANSETRON HCL 4 MG/2ML IJ SOLN
4.0000 mg | Freq: Three times a day (TID) | INTRAMUSCULAR | Status: DC | PRN
  Administered 2022-12-31: 4 mg via INTRAVENOUS
  Filled 2022-12-31: qty 2

## 2022-12-31 MED ORDER — CEFAZOLIN SODIUM-DEXTROSE 2-4 GM/100ML-% IV SOLN
INTRAVENOUS | Status: AC
  Filled 2022-12-31: qty 100

## 2022-12-31 MED ORDER — ZOLPIDEM TARTRATE 5 MG PO TABS: 5.0000 mg | ORAL_TABLET | Freq: Every evening | ORAL | Status: DC | PRN

## 2022-12-31 MED ORDER — KETOROLAC TROMETHAMINE 30 MG/ML IJ SOLN: 30.0000 mg | Freq: Four times a day (QID) | INTRAMUSCULAR | Status: AC | PRN

## 2022-12-31 MED ORDER — LIDOCAINE 5 % EX PTCH
MEDICATED_PATCH | CUTANEOUS | Status: AC
  Filled 2022-12-31: qty 1

## 2022-12-31 MED ORDER — OXYTOCIN-SODIUM CHLORIDE 30-0.9 UT/500ML-% IV SOLN
INTRAVENOUS | Status: AC
  Filled 2022-12-31: qty 500

## 2022-12-31 MED ORDER — SCOPOLAMINE 1 MG/3DAYS TD PT72
1.0000 | MEDICATED_PATCH | Freq: Once | TRANSDERMAL | Status: DC
  Administered 2022-12-31: 1.5 mg via TRANSDERMAL
  Filled 2022-12-31: qty 1

## 2022-12-31 MED ORDER — SENNOSIDES-DOCUSATE SODIUM 8.6-50 MG PO TABS
2.0000 | ORAL_TABLET | ORAL | Status: DC
  Administered 2022-12-31 – 2023-01-02 (×2): 2 via ORAL
  Filled 2022-12-31 (×3): qty 2

## 2022-12-31 MED ORDER — MORPHINE SULFATE (PF) 0.5 MG/ML IJ SOLN
INTRAMUSCULAR | Status: DC | PRN
  Administered 2022-12-31: .1 mg via INTRATHECAL

## 2022-12-31 SURGICAL SUPPLY — 29 items
ADH LQ OCL WTPRF AMP STRL LF (MISCELLANEOUS) ×1
ADHESIVE MASTISOL STRL (MISCELLANEOUS) ×1 IMPLANT
APL PRP STRL LF DISP 70% ISPRP (MISCELLANEOUS) ×2
BAG COUNTER SPONGE SURGICOUNT (BAG) ×1 IMPLANT
BAG SPNG CNTER NS LX DISP (BAG) ×1
CHLORAPREP W/TINT 26 (MISCELLANEOUS) ×2 IMPLANT
CLIP FILSHIE TUBAL LIGA STRL (Clip) IMPLANT
DRSG TELFA 3X8 NADH STRL (GAUZE/BANDAGES/DRESSINGS) ×1 IMPLANT
GAUZE SPONGE 4X4 12PLY STRL (GAUZE/BANDAGES/DRESSINGS) ×1 IMPLANT
GLOVE PI ORTHO PRO STRL 7.5 (GLOVE) ×1 IMPLANT
GOWN STRL REUS W/ TWL LRG LVL3 (GOWN DISPOSABLE) ×2 IMPLANT
GOWN STRL REUS W/TWL LRG LVL3 (GOWN DISPOSABLE) ×2
KIT TURNOVER KIT A (KITS) ×1 IMPLANT
MANIFOLD NEPTUNE II (INSTRUMENTS) ×1 IMPLANT
MAT PREVALON FULL STRYKER (MISCELLANEOUS) ×1 IMPLANT
NS IRRIG 1000ML POUR BTL (IV SOLUTION) ×1 IMPLANT
PACK C SECTION AR (MISCELLANEOUS) ×1 IMPLANT
PAD OB MATERNITY 4.3X12.25 (PERSONAL CARE ITEMS) ×1 IMPLANT
PAD PREP 24X41 OB/GYN DISP (PERSONAL CARE ITEMS) ×1 IMPLANT
RETRACTOR WND ALEXIS-O 25 LRG (MISCELLANEOUS) ×1 IMPLANT
RTRCTR WOUND ALEXIS O 25CM LRG (MISCELLANEOUS) ×1
SCRUB CHG 4% DYNA-HEX 4OZ (MISCELLANEOUS) ×1 IMPLANT
SPONGE T-LAP 18X18 ~~LOC~~+RFID (SPONGE) ×1 IMPLANT
SUT VIC AB 0 CTX 36 (SUTURE) ×2
SUT VIC AB 0 CTX36XBRD ANBCTRL (SUTURE) ×2 IMPLANT
SUT VIC AB 1 CT1 36 (SUTURE) ×2 IMPLANT
SUT VICRYL+ 3-0 36IN CT-1 (SUTURE) ×2 IMPLANT
TRAP FLUID SMOKE EVACUATOR (MISCELLANEOUS) ×1 IMPLANT
WATER STERILE IRR 500ML POUR (IV SOLUTION) ×1 IMPLANT

## 2022-12-31 NOTE — Anesthesia Procedure Notes (Signed)
Spinal  Patient location during procedure: OR Start time: 12/31/2022 8:05 AM End time: 12/31/2022 8:08 AM Reason for block: surgical anesthesia Staffing Performed: resident/CRNA  Resident/CRNA: Demetrius Charity, CRNA Performed by: Demetrius Charity, CRNA Authorized by: Ilene Qua, MD   Preanesthetic Checklist Completed: patient identified, IV checked, site marked, risks and benefits discussed, surgical consent, monitors and equipment checked, pre-op evaluation and timeout performed Spinal Block Patient position: sitting Prep: ChloraPrep Patient monitoring: heart rate, continuous pulse ox, blood pressure and cardiac monitor Approach: midline Location: L4-5 Injection technique: single-shot Needle Needle type: Whitacre and Introducer  Needle gauge: 25 G Needle length: 9 cm Assessment Sensory level: T4 Events: CSF return Additional Notes Sterile aseptic technique used throughout the procedure.  Negative paresthesia. Negative blood return. Positive free-flowing CSF. Expiration date of kit checked and confirmed. Patient tolerated procedure well, without complications.

## 2022-12-31 NOTE — Transfer of Care (Signed)
Immediate Anesthesia Transfer of Care Note  Patient: Erika Solomon  Procedure(s) Performed: REPEAT CESAREAN DELIVERY  Patient Location: PACU  Anesthesia Type:Spinal  Level of Consciousness: awake, alert , and oriented  Airway & Oxygen Therapy: Patient Spontanous Breathing  Post-op Assessment: Report given to RN and Post -op Vital signs reviewed and stable  Post vital signs: Reviewed and stable  Last Vitals:  Vitals Value Taken Time  BP    Temp    Pulse    Resp    SpO2      Last Pain:  Vitals:   12/31/22 0714  TempSrc:   PainSc: 0-No pain         Complications: No notable events documented.

## 2022-12-31 NOTE — Anesthesia Procedure Notes (Signed)
Date/Time: 12/31/2022 8:13 AM  Performed by: Demetrius Charity, CRNAPre-anesthesia Checklist: Patient identified, Emergency Drugs available, Suction available, Patient being monitored and Timeout performed Patient Re-evaluated:Patient Re-evaluated prior to induction Oxygen Delivery Method: Nasal cannula Placement Confirmation: CO2 detector and positive ETCO2

## 2022-12-31 NOTE — Anesthesia Preprocedure Evaluation (Signed)
Anesthesia Evaluation  Patient identified by MRN, date of birth, ID band Patient awake    Reviewed: Allergy & Precautions, NPO status , Patient's Chart, lab work & pertinent test results  History of Anesthesia Complications Negative for: history of anesthetic complications  Airway Mallampati: III  TM Distance: >3 FB Neck ROM: full    Dental no notable dental hx.    Pulmonary neg pulmonary ROS   Pulmonary exam normal        Cardiovascular Exercise Tolerance: Good hypertension, On Medications negative cardio ROS Normal cardiovascular exam     Neuro/Psych  PSYCHIATRIC DISORDERS Anxiety Depression       GI/Hepatic negative GI ROS,,,  Endo/Other  diabetes, Poorly Controlled, Gestational    Renal/GU   negative genitourinary   Musculoskeletal   Abdominal   Peds  Hematology negative hematology ROS (+)   Anesthesia Other Findings Past Medical History: No date: Anemia No date: Anxiety and depression No date: Asthma No date: Blood transfusion without reported diagnosis No date: Gestational diabetes No date: Hx of preeclampsia, prior pregnancy, currently pregnant No date: Hypertension No date: Macrosomia No date: Panic disorder 12/26/2016: Postpartum care following cesarean delivery No date: Shoulder dystocia, delivered  Past Surgical History: 12/23/2016: CESAREAN SECTION; N/A     Comment:  Procedure: CESAREAN SECTION;  Surgeon: Malachy Mood,              MD;  Location: [facility] ORS;  Service: Obstetrics;                Laterality: N/A; No date: WISDOM TOOTH EXTRACTION     Comment:  four; 2018     Reproductive/Obstetrics (+) Pregnancy                             Anesthesia Physical Anesthesia Plan  ASA: 3  Anesthesia Plan: Spinal   Post-op Pain Management:    Induction:   PONV Risk Score and Plan:   Airway Management Planned: Natural Airway and Nasal Cannula  Additional  Equipment:   Intra-op Plan:   Post-operative Plan:   Informed Consent: I have reviewed the patients History and Physical, chart, labs and discussed the procedure including the risks, benefits and alternatives for the proposed anesthesia with the patient or authorized representative who has indicated his/her understanding and acceptance.     Dental Advisory Given  Plan Discussed with: Anesthesiologist  Anesthesia Plan Comments: (Patient reports no bleeding problems and no anticoagulant use.  Plan for spinal with backup GA  Patient consented for risks of anesthesia including but not limited to:  - adverse reactions to medications - damage to eyes, teeth, lips or other oral mucosa - nerve damage due to positioning  - risk of bleeding, infection and or nerve damage from spinal that could lead to paralysis - risk of headache or failed spinal - damage to teeth, lips or other oral mucosa - sore throat or hoarseness - damage to heart, brain, nerves, lungs, other parts of body or loss of life  Patient voiced understanding.)       Anesthesia Quick Evaluation

## 2022-12-31 NOTE — H&P (Signed)
History and Physical   HPI  Erika Solomon is a 27 y.o. [id number] at [redacted]w[redacted]d Estimated Date of Delivery: 01/18/23 who is being admitted for repeat CD.  Her pregnancy has been complicated by chronic hypertension, uncontrolled diabetes, history of preeclampsia, obesity, and suspected LGA infant.  Patient had a previous 10 pound infant that delivered by cesarean delivery.  She has elected to have a repeat cesarean delivery today.   OB History  OB History  Gravida Para Term Preterm AB Living  4 3 3  0 0 3  SAB IAB Ectopic Multiple Live Births  0 0 0 0 3    # Outcome Date GA Lbr Len/2nd Weight Sex Delivery Anes PTL Lv  4 Current           3 Term 12/13/16 [redacted]w[redacted]d  4649 g M CS-LTranv  N LIV     Birth Comments: given blood after c/s     Complications: Shoulder Dystocia, Failure to Progress in First Stage, Preeclampsia  2 Term 05/22/15 [redacted]w[redacted]d  4170 g M Vag-Spont None  LIV     Complications: Pre-eclampsia     Name: Boies,BOY Emary     Apgar1: 6  Apgar5: 9  1 Term 06/19/14 [redacted]w[redacted]d  3175 g M Vag-Spont  N LIV    Obstetric Comments  Induced at 39 weeks in second pregnancy for pre-eclampsia.     PROBLEM LIST  Pregnancy complications or risks: Patient Active Problem List   Diagnosis Date Noted   Term pregnancy, repeat 12/31/2022   [redacted] weeks gestation of pregnancy 12/24/2022   [redacted] weeks gestation of pregnancy 12/20/2022   Hypertension in pregnancy, antepartum 12/15/2022   History of cesarean delivery affecting pregnancy 08/24/2022   History of macrosomia in infant in prior pregnancy, currently pregnant 08/24/2022   History of shoulder dystocia in prior pregnancy 08/24/2022   Maternal chronic hypertension, third trimester 07/23/2022   Obesity affecting pregnancy 07/23/2022   Pre-existing type 2 diabetes mellitus during pregnancy 07/05/2022   Supervision of high risk pregnancy, antepartum 06/03/2022   Iron deficiency anemia 12/26/2016   Maternal varicella, non-immune 10/05/2016   Anemia of  pregnancy in third trimester 10/05/2016   Hx of preeclampsia, prior pregnancy, currently pregnant 05/21/2015    Prenatal labs and studies: ABO, Rh: --/--/A POS (03/27 0843) Antibody: NEG (03/27 0843) Rubella: <0.90 (08/21 1105) RPR: NON REACTIVE (03/13 1321)  HBsAg: Negative (08/21 1105)  HIV: Non Reactive (08/21 1105)  [id number] (03/12 0920)   Past Medical History:  Diagnosis Date   Anemia    Anxiety and depression    Asthma    Blood transfusion without reported diagnosis    Gestational diabetes    Hx of preeclampsia, prior pregnancy, currently pregnant    Hypertension    Macrosomia    Panic disorder    Postpartum care following cesarean delivery 12/26/2016   Shoulder dystocia, delivered      Past Surgical History:  Procedure Laterality Date   CESAREAN SECTION N/A 12/23/2016   Procedure: CESAREAN SECTION;  Surgeon: Malachy Mood, MD;  Location: [facility] ORS;  Service: Obstetrics;  Laterality: N/A;   WISDOM TOOTH EXTRACTION     four; 2018     Medications    Current Discharge Medication List     CONTINUE these medications which have NOT CHANGED   Details  aspirin EC 81 MG tablet Take 81 mg by mouth daily. Swallow whole.    glucose blood (ACCU-CHEK GUIDE) test strip 1 each by Other route in the morning, at  noon, in the evening, and at bedtime. Check blood glucose four times a day: fasting and 2 hours after breakfast, lunch, and dinner Qty: 100 each, Refills: 6   Associated Diagnoses: Diet controlled gestational diabetes mellitus (GDM), antepartum    insulin glargine (LANTUS) 100 UNIT/ML injection Inject 0.44 mLs (44 Units total) into the skin at bedtime. Pt taking as of 2/29 Qty: 30 mL, Refills: 1   Associated Diagnoses: Pre-existing type 1 diabetes mellitus during pregnancy in second trimester    insulin regular (HUMULIN R) 100 units/mL injection Inject 0.14 mLs (14 Units total) into the skin 3 (three) times daily before meals. Qty: 10 mL, Refills: 11    Associated Diagnoses: Pre-existing type 1 diabetes mellitus during pregnancy in second trimester    Insulin Syringe-Needle U-100 31G X 5/16" 0.5 ML MISC Use as directed. Qty: 100 each, Refills: 1   Associated Diagnoses: Pre-existing type 2 diabetes mellitus during pregnancy in second trimester    labetalol (NORMODYNE) 200 MG tablet Take 1 tablet (200 mg total) by mouth 2 (two) times daily. Qty: 60 tablet, Refills: 6    metoCLOPramide (REGLAN) 10 MG tablet Take 1 tablet (10 mg total) by mouth 4 (four) times daily as needed for nausea or vomiting. Qty: 30 tablet, Refills: 2    nitrofurantoin, macrocrystal-monohydrate, (MACROBID) 100 MG capsule Take 1 capsule (100 mg total) by mouth 2 (two) times daily. Qty: 14 capsule, Refills: 0   Associated Diagnoses: Supervision of high risk pregnancy, antepartum; Urinary tract infection in mother during third trimester of pregnancy    ondansetron (ZOFRAN-ODT) 4 MG disintegrating tablet Take 1 tablet (4 mg total) by mouth every 6 (six) hours as needed for nausea. Qty: 20 tablet, Refills: 2   Associated Diagnoses: Nausea/vomiting in pregnancy; [redacted] weeks gestation of pregnancy    Prenatal Vit-Fe Fumarate-FA (MULTIVITAMIN-PRENATAL) 27-0.8 MG TABS tablet Take 1 tablet by mouth daily at 12 noon.         Allergies  Albuterol, Benadryl [diphenhydramine], Buspar [buspirone], Strawberry extract, and Milk-related compounds  Review of Systems  Pertinent items noted in HPI and remainder of comprehensive ROS otherwise negative.  Physical Exam  BP 130/82 (BP Location: Left Arm)   Pulse 95   Temp 99 F (37.2 C) (Oral)   Resp 16   Ht 5\' 4"  (1.626 m)   Wt 110.7 kg   LMP 04/13/2022 (Exact Date)   BMI 41.88 kg/m   Lungs:  CTA B Cardio: RRR without M/R/G Abd: Soft, gravid, NT Presentation: cephalic EXT: No C/C/ 1+ Edema DTRs: 2+ B CERVIX:    See Prenatal records for more detailed PE.     FHR:  Variability: Good {> 6 bpm)  Toco: Uterine  Contractions: None  Test Results  Results for orders placed or performed during the hospital encounter of 12/31/22 (from the past 24 hour(s))  CBC with Differential     Status: Abnormal   Collection Time: 12/31/22  6:37 AM  Result Value Ref Range   WBC 6.5 4.0 - 10.5 K/uL   RBC 3.46 (L) 3.87 - 5.11 MIL/uL   Hemoglobin 9.8 (L) 12.0 - 15.0 g/dL   HCT 29.9 (L) 36.0 - 46.0 %   MCV 86.4 80.0 - 100.0 fL   MCH 28.3 26.0 - 34.0 pg   MCHC 32.8 30.0 - 36.0 g/dL   RDW 17.8 (H) 11.5 - 15.5 %   Platelets 205 150 - 400 K/uL   nRBC 0.0 0.0 - 0.2 %   Neutrophils Relative % 73 %  Neutro Abs 4.7 1.7 - 7.7 K/uL   Lymphocytes Relative 17 %   Lymphs Abs 1.1 0.7 - 4.0 K/uL   Monocytes Relative 8 %   Monocytes Absolute 0.6 0.1 - 1.0 K/uL   Eosinophils Relative 1 %   Eosinophils Absolute 0.0 0.0 - 0.5 K/uL   Basophils Relative 0 %   Basophils Absolute 0.0 0.0 - 0.1 K/uL   Immature Granulocytes 1 %   Abs Immature Granulocytes 0.09 (H) 0.00 - 0.07 K/uL     Assessment   G4P3003 at 109w3d Estimated Date of Delivery: 01/18/23  The fetus is reassuring.   Patient Active Problem List   Diagnosis Date Noted   Term pregnancy, repeat 12/31/2022   [redacted] weeks gestation of pregnancy 12/24/2022   [redacted] weeks gestation of pregnancy 12/20/2022   Hypertension in pregnancy, antepartum 12/15/2022   History of cesarean delivery affecting pregnancy 08/24/2022   History of macrosomia in infant in prior pregnancy, currently pregnant 08/24/2022   History of shoulder dystocia in prior pregnancy 08/24/2022   Maternal chronic hypertension, third trimester 07/23/2022   Obesity affecting pregnancy 07/23/2022   Pre-existing type 2 diabetes mellitus during pregnancy 07/05/2022   Supervision of high risk pregnancy, antepartum 06/03/2022   Iron deficiency anemia 12/26/2016   Maternal varicella, non-immune 10/05/2016   Anemia of pregnancy in third trimester 10/05/2016   Hx of preeclampsia, prior pregnancy, currently pregnant  05/21/2015    Plan  1. Admit to L&D :   2. EFM: -- Category 1 3. Repeat CD  Finis Bud, M.D. 12/31/2022 7:45 AM

## 2022-12-31 NOTE — Op Note (Signed)
     OP NOTE  Date: 12/31/2022   9:43 AM Name Erika Solomon MR# [id number]  Preoperative Diagnosis: 1. Intrauterine pregnancy at [redacted]w[redacted]d Principal Problem:   Term pregnancy, repeat  2.  Pt desires repeat 3.  H/O macrosomia - suspect LGA 4.  cHTN 5.  Uncontrolled DM on insulin  Postoperative Diagnosis: 1. Intrauterine pregnancy at [redacted]w[redacted]d, delivered 2. Viable infant 3. Remainder same as pre-op   Procedure: 1.  Primary Low-Transverse Cesarean Section  Surgeon: Finis Bud, MD  Assistant: Grandville Silos, CNM  No other capable assistant was available for this surgery which requires an experienced, high level assistant.   Anesthesia: Spinal    EBL: 800  ml     Findings: 1) female infant, Apgar scores of 9   at 1 minute and 9   at 5 minutes and a birthweight of 139.68  ounces.    2) Normal uterus, tubes and ovaries.    Procedure:  The patient was prepped and draped in the supine position and placed under spinal anesthesia.  A transverse incision was made across the abdomen in a Pfannenstiel manner. If indicated the old scar was systematically removed with sharp dissection.  We carried the dissection down to the level of the fascia.  The fascia was incised in a curvilinear manner.  The fascia was then elevated from the rectus muscles with blunt and sharp dissection.  The rectus muscles were separated laterally exposing the peritoneum.  The peritoneum was carefully entered with care being taken to avoid bowel and bladder.  A self-retaining retractor was placed.  The visceral peritoneum was incised in a curvilinear fashion across the lower uterine segment creating a bladder flap. A transverse incision was made across the lower uterine segment and extended laterally and superiorly using the bandage scissors.  Artificial rupture membranes was performed and Clear fluid was noted.  The infant was delivered from the cephalic position.  A nuchal cord was not present. After an appropriate time  interval, the cord was doubly clamped and cut. Cord blood was obtained if required.  The infant was handed to the pediatric personnel  who then placed the infant under heat lamps where it was cleaned dried and suctioned as needed. The placenta was delivered. The hysterotomy incision was then identified on ring forceps.  The uterine cavity was cleaned with a moist lap sponge.  The hysterotomy incision was closed with a running interlocking suture of Vicryl.  Hemostasis was excellent.  Pitocin was run in the IV and the uterus was found to be firm. The posterior cul-de-sac and gutters were cleaned and inspected.  Hemostasis was noted.  The fascia was then closed with a running suture of #1 Vicryl.  Hemostasis of the subcutaneous tissues was obtained using the Bovie.  The subcutaneous tissues were closed with a running suture of 000 Vicryl.  A subcuticular suture was placed.  Steri-strips were applied in the usual manner.  A Lidoderm patch was applied.  A pressure dressing was placed.  The patient went to the recovery room in stable condition. Grandville Silos, CNM provided exposure, dissection, suctioning, retraction, and general support and assistance during the procedure.   Finis Bud, M.D. 12/31/2022 9:43 AM

## 2023-01-01 LAB — GLUCOSE, CAPILLARY
Glucose-Capillary: 104 mg/dL — ABNORMAL HIGH (ref 70–99)
Glucose-Capillary: 117 mg/dL — ABNORMAL HIGH (ref 70–99)
Glucose-Capillary: 119 mg/dL — ABNORMAL HIGH (ref 70–99)
Glucose-Capillary: 125 mg/dL — ABNORMAL HIGH (ref 70–99)
Glucose-Capillary: 84 mg/dL (ref 70–99)
Glucose-Capillary: 93 mg/dL (ref 70–99)

## 2023-01-01 LAB — RPR: RPR Ser Ql: NONREACTIVE

## 2023-01-01 MED ORDER — INSULIN DETEMIR 100 UNIT/ML ~~LOC~~ SOLN
20.0000 [IU] | Freq: Every day | SUBCUTANEOUS | Status: DC
  Administered 2023-01-01: 20 [IU] via SUBCUTANEOUS
  Filled 2023-01-01 (×2): qty 0.2

## 2023-01-01 NOTE — Progress Notes (Signed)
Progress Note - Cesarean Delivery  Erika Solomon is a 27 y.o. [phone] now PP day 1 s/p C-Section, Low Transverse .   Subjective:  Patient reports no problems with eating, bowel movements, voiding, or their wound  Attempting to breastfeed.  Pain controlled.  Objective:  Vital signs in last 24 hours: Temp:  [97.6 F (36.4 C)-98.3 F (36.8 C)] 98.3 F (36.8 C) (03/30 0816) Pulse Rate:  [85-95] 93 (03/30 0816) Resp:  [16-20] 20 (03/30 0816) BP: (110-126)/(70-89) 121/75 (03/30 0816) SpO2:  [98 %-99 %] 98 % (03/30 0816)  Physical Exam:  General: alert, cooperative, and no distress Lochia: appropriate Uterine Fundus: firm Incision: dressing intact    Data Review Recent Labs    12/31/22 0637  HGB 9.8*  HCT 29.9*    Assessment:  Principal Problem:   Term pregnancy, repeat   Status post Cesarean section. Doing well postoperatively.   Sugars controlled well  Plan:       Continue current care.  Prob discharge tomorrow.  Finis Bud, M.D. 01/01/2023 10:20 AM

## 2023-01-01 NOTE — Anesthesia Post-op Follow-up Note (Signed)
  Anesthesia Pain Follow-up Note  Patient: Erika Solomon  Day #: 1  Date of Follow-up: 01/01/2023 Time: 11:18 AM  Last Vitals:  Vitals:   01/01/23 0353 01/01/23 0816  BP: 116/72 121/75  Pulse: 88 93  Resp: 18 20  Temp: 36.5 C 36.8 C  SpO2: 98% 98%    Level of Consciousness: alert  Pain: mild   Side Effects:Pruritis, resolved now  Catheter Site Exam:clean, dry     Plan: D/C from anesthesia care at surgeon's request  Martha Clan

## 2023-01-01 NOTE — Progress Notes (Signed)
Insulin given as per scheduled order. 2200 CBG was 117. Instructed Pt. In s/s Hypoglycemia and she v/o.

## 2023-01-01 NOTE — Anesthesia Postprocedure Evaluation (Signed)
Anesthesia Post Note  Patient: Erika Solomon  Procedure(s) Performed: REPEAT CESAREAN DELIVERY  Patient location during evaluation: Mother Baby Anesthesia Type: Spinal Level of consciousness: oriented and awake and alert Pain management: pain level controlled Vital Signs Assessment: post-procedure vital signs reviewed and stable Respiratory status: spontaneous breathing and respiratory function stable Cardiovascular status: blood pressure returned to baseline and stable Postop Assessment: no headache, no backache, no apparent nausea or vomiting and able to ambulate Anesthetic complications: no   No notable events documented.   Last Vitals:  Vitals:   01/01/23 0353 01/01/23 0816  BP: 116/72 121/75  Pulse: 88 93  Resp: 18 20  Temp: 36.5 C 36.8 C  SpO2: 98% 98%    Last Pain:  Vitals:   01/01/23 0847  TempSrc:   PainSc: 0-No pain                 Martha Clan

## 2023-01-01 NOTE — Lactation Note (Signed)
This note was copied from a baby's chart. Lactation Consultation Note  Patient Name: Girl Junice Neally [id number] Date: 01/01/2023 Age:29 hours     Maternal Data   Mom did not breastfeed her other children Feeding Nipple Type: Slow - flow Mom offers breast first and baby is latching and nursing  with more ease each feeding, baby had issues with blood sugar yesterday and has been supplemented with formula, mom set up with DEBP today  to  assist in getting EBM supplement, she has pumped 6cc and 5 cc each time she has pumped   LATCH Score  See feeding flowsheet                  Lactation Tools Discussed/Used  DEBP  Interventions  See feeding flowsheet Continue breastfeeding and pumping tonight, with EBM and formula supplement.  Discharge    Consult Status  prn    Ferol Luz 01/01/2023, 8:05 PM

## 2023-01-02 LAB — GLUCOSE, CAPILLARY
Glucose-Capillary: 94 mg/dL (ref 70–99)
Glucose-Capillary: 91 mg/dL (ref 70–99)
Glucose-Capillary: 75 mg/dL (ref 70–99)

## 2023-01-02 MED ORDER — INSULIN DETEMIR 100 UNIT/ML ~~LOC~~ SOLN: 15.0000 [IU] | Freq: Every day | SUBCUTANEOUS | 11 refills | Status: DC

## 2023-01-02 MED ORDER — VARICELLA VIRUS VACCINE LIVE 1350 PFU/0.5ML IJ SUSR
0.5000 mL | Freq: Once | INTRAMUSCULAR | Status: AC
  Administered 2023-01-02: 0.5 mL via SUBCUTANEOUS
  Filled 2023-01-02: qty 0.5

## 2023-01-02 MED ORDER — MEASLES, MUMPS & RUBELLA VAC IJ SOLR
0.5000 mL | Freq: Once | INTRAMUSCULAR | Status: AC
  Administered 2023-01-02: 0.5 mL via SUBCUTANEOUS
  Filled 2023-01-02: qty 0.5

## 2023-01-02 MED ORDER — OXYCODONE HCL 5 MG PO TABS: 5.0000 mg | ORAL_TABLET | Freq: Four times a day (QID) | ORAL | 0 refills | Status: DC | PRN

## 2023-01-02 MED ORDER — IBUPROFEN 600 MG PO TABS: 600.0000 mg | ORAL_TABLET | Freq: Four times a day (QID) | ORAL | 0 refills | Status: DC

## 2023-01-02 NOTE — Progress Notes (Signed)
Verb understanding of d/c instructions  Home to self care 

## 2023-01-02 NOTE — Discharge Summary (Signed)
Postpartum Discharge Summary  Date of Service updated  01/02/2023     Patient Name: Erika Solomon DOB: [birth year] MRN: [id number]  Date of admission: 12/31/2022 Delivery date:12/31/2022  Delivering provider: Harlin Heys  Date of discharge: 01/02/2023  Admitting diagnosis: Term pregnancy, repeat [Z34.90] Intrauterine pregnancy: [redacted]w[redacted]d     Secondary diagnosis:  Principal Problem:   Term pregnancy, repeat  Additional problems: continues on insulin    Discharge diagnosis: Term Pregnancy Delivered                                              Post partum procedures: none Augmentation: N/A Complications: None  Hospital course: Sceduled C/S   27 y.o. yo [id number] at [redacted]w[redacted]d was admitted to the hospital 12/31/2022 for scheduled cesarean section with the following indication:Elective Repeat.Delivery details are as follows:  Membrane Rupture Time/Date: 8:34 AM ,12/31/2022   Delivery Method:C-Section, Low Transverse  Details of operation can be found in separate operative note.  Patient had a postpartum course complicated by. Nothing . She is continuing on insulin, with plans for f/u later this week with Dr. Amalia Hailey. She is ambulating, tolerating a regular diet, passing flatus, and urinating well. Patient is discharged home in stable condition on  01/02/23        Newborn Data: Birth date:12/31/2022  Birth time:8:35 AM  Gender:Female  Living status:Living  Apgars:9 ,9  Weight:3960 g     Magnesium Sulfate received: No BMZ received: No Rhophylac:No MMR:Yes T-DaP:Given prenatally Flu: No Transfusion:No  Physical exam  Vitals:   01/01/23 1938 01/01/23 2309 01/02/23 0339 01/02/23 0802  BP: (!) 151/80 119/77 128/71 123/79  Pulse: (!) 103 94 90 88  Resp: 18 16 18 20   Temp: 98.1 F (36.7 C) 97.9 F (36.6 C) 98.2 F (36.8 C) 98.1 F (36.7 C)  TempSrc: Oral Oral Oral Oral  SpO2: 98% 99% 100% 99%  Weight:      Height:       General: alert, cooperative, and no distress Lochia:  appropriate Uterine Fundus: firm Incision: Healing well with no significant drainage, No significant erythema, Dressing is clean, dry, and intact DVT Evaluation: No evidence of DVT seen on physical exam. Negative Homan's sign. No cords or calf tenderness. Labs: Lab Results  Component Value Date   WBC 6.5 12/31/2022   HGB 9.8 (L) 12/31/2022   HCT 29.9 (L) 12/31/2022   MCV 86.4 12/31/2022   PLT 205 12/31/2022      Latest Ref Rng & Units 12/15/2022    1:21 PM  CMP  Glucose 70 - 99 mg/dL 76   BUN 6 - 20 mg/dL 5   Creatinine 0.44 - 1.00 mg/dL 0.48   Sodium 135 - 145 mmol/L 135   Potassium 3.5 - 5.1 mmol/L 3.8   Chloride 98 - 111 mmol/L 108   CO2 22 - 32 mmol/L 22   Calcium 8.9 - 10.3 mg/dL 9.2   Total Protein 6.5 - 8.1 g/dL 6.7   Total Bilirubin 0.3 - 1.2 mg/dL 0.5   Alkaline Phos 38 - 126 U/L 68   AST 15 - 41 U/L 16   ALT 0 - 44 U/L 11    Edinburgh Score:    01/01/2023    8:03 PM  Edinburgh Postnatal Depression Scale Screening Tool  I have been able to laugh and see the funny side of things.  0  I have looked forward with enjoyment to things. 0  I have blamed myself unnecessarily when things went wrong. 0  I have been anxious or worried for no good reason. 0  I have felt scared or panicky for no good reason. 0  Things have been getting on top of me. 0  I have been so unhappy that I have had difficulty sleeping. 0  I have felt sad or miserable. 0  I have been so unhappy that I have been crying. 0  The thought of harming myself has occurred to me. 0  Edinburgh Postnatal Depression Scale Total 0      After visit meds:  Allergies as of 01/02/2023       Reactions   Albuterol Anaphylaxis   Benadryl [diphenhydramine] Anaphylaxis   Buspar [buspirone] Anaphylaxis   Strawberry Extract Anaphylaxis   Milk-related Compounds Nausea And Vomiting        Medication List     STOP taking these medications    aspirin EC 81 MG tablet   insulin glargine 100 UNIT/ML  injection Commonly known as: Lantus   insulin regular 100 units/mL injection Commonly known as: HumuLIN R   labetalol 200 MG tablet Commonly known as: NORMODYNE   metoCLOPramide 10 MG tablet Commonly known as: REGLAN   ondansetron 4 MG disintegrating tablet Commonly known as: ZOFRAN-ODT       TAKE these medications    Accu-Chek Guide test strip Generic drug: glucose blood 1 each by Other route in the morning, at noon, in the evening, and at bedtime. Check blood glucose four times a day: fasting and 2 hours after breakfast, lunch, and dinner   ibuprofen 600 MG tablet Commonly known as: ADVIL Take 1 tablet (600 mg total) by mouth every 6 (six) hours.   insulin detemir 100 UNIT/ML injection Commonly known as: LEVEMIR Inject 0.15 mLs (15 Units total) into the skin at bedtime.   Insulin Syringe-Needle U-100 31G X 5/16" 0.5 ML Misc Use as directed.   multivitamin-prenatal 27-0.8 MG Tabs tablet Take 1 tablet by mouth daily at 12 noon.   nitrofurantoin (macrocrystal-monohydrate) 100 MG capsule Commonly known as: Macrobid Take 1 capsule (100 mg total) by mouth 2 (two) times daily.   oxyCODONE 5 MG immediate release tablet Commonly known as: Oxy IR/ROXICODONE Take 1 tablet (5 mg total) by mouth every 6 (six) hours as needed for severe pain.               Discharge Care Instructions  (From admission, onward)           Start     Ordered   01/02/23 0000  Leave dressing on - Keep it clean, dry, and intact until clinic visit        01/02/23 1415             Discharge home in stable condition Infant Feeding: Breast Infant Disposition:home with mother Discharge instruction: per After Visit Summary and Postpartum booklet. Activity: Advance as tolerated. Pelvic rest for 6 weeks.  Diet: carb modified diet Anticipated Birth Control: Unsure Needs to discuss with her provider. Postpartum Appointment:1 week and 6 weeks. Additional Postpartum F/U: Incision check  1 week Future Appointments: Future Appointments  Date Time Provider [place]  01/07/2023 11:15 AM Harlin Heys, MD [facility] None   Follow up Visit:  Follow-up Information     Harlin Heys, MD. Go in 1 week(s).   Specialties: Obstetrics and Gynecology, Radiology Why: Pleae go to your appointment  on Friday, and then set up another appointment for 6 weks post delivery. Contact information: [place] [address] [phone]                     01/02/2023 Imagene Riches, CNM

## 2023-01-02 NOTE — Lactation Note (Signed)
This note was copied from a baby's chart. Lactation Consultation Note  Patient Name: Erika Solomon [id number] Date: 01/02/2023 Age:29 hours Reason for consult: Follow-up assessment   Maternal Data Has patient been taught Hand Expression?: Yes Does the patient have breastfeeding experience prior to this delivery?: No (3 boys ages 33, 53, and 8 not breastfed)  Feeding Mother's Current Feeding Choice: Breast Milk and Formula Nipple Type: Slow - flow  LATCH Score Latch: Repeated attempts needed to sustain latch, nipple held in mouth throughout feeding, stimulation needed to elicit sucking reflex.  Audible Swallowing: A few with stimulation  Type of Nipple: Everted at rest and after stimulation  Comfort (Breast/Nipple): Filling, red/small blisters or bruises, mild/mod discomfort  Hold (Positioning): No assistance needed to correctly position infant at breast.  LATCH Score: 7  Mom complained of some tenderness at initial latch and occasionally during feeding. Baby holds tongue at lower gumline but will slightly extend and will cup finger with rhythmic suck. Discussed calm "playtime" technique of tapping finger on baby's mouth to encourage stretching tongue out. Position in cradle hold adjusted to keep ear, shoulder, and hip in alignment. Slightly fussy with 2-3 latch and release episodes but once mom used c-hold and aimed nipple to roof of mouth, baby held latch with audible swallows noted. Stimulation needed to keep baby awake. Mom encouraged to keep baby at breast for 15-20 minutes with stimulation and to offer the second side. No increasing fullness noted but mom told about expectations over the first week regarding infant expected input and output patterns. Told to contact [facility] for continued discomfort past 2 weeks or increasing pain with feeding.   Lactation Tools Discussed/Used    Interventions Interventions: Breast feeding basics reviewed;Assisted with latch;Adjust position;Position  options;Education;Control and instrumentation engineer  Discharge Discharge Education: Engorgement and breast care;Outpatient recommendation;Warning signs for feeding baby Pump: Personal (troubleshooting discussion about loss of suction with Elvie and Momcozy)  Consult Status      Vimal Derego A Lillian Ballester 01/02/2023, 1:54 PM

## 2023-01-07 ENCOUNTER — Ambulatory Visit (INDEPENDENT_AMBULATORY_CARE_PROVIDER_SITE_OTHER): Payer: Medicaid Other | Admitting: Obstetrics and Gynecology

## 2023-01-07 ENCOUNTER — Encounter: Payer: Self-pay | Admitting: Obstetrics and Gynecology

## 2023-01-07 VITALS — BP 145/86 | HR 75 | Ht 64.0 in | Wt 226.5 lb

## 2023-01-07 DIAGNOSIS — O24119 Pre-existing diabetes mellitus, type 2, in pregnancy, unspecified trimester: Secondary | ICD-10-CM

## 2023-01-07 DIAGNOSIS — O0990 Supervision of high risk pregnancy, unspecified, unspecified trimester: Secondary | ICD-10-CM

## 2023-01-07 DIAGNOSIS — O10913 Unspecified pre-existing hypertension complicating pregnancy, third trimester: Secondary | ICD-10-CM

## 2023-01-07 DIAGNOSIS — Z9889 Other specified postprocedural states: Secondary | ICD-10-CM

## 2023-01-07 DIAGNOSIS — O99210 Obesity complicating pregnancy, unspecified trimester: Secondary | ICD-10-CM

## 2023-01-07 LAB — POCT URINALYSIS DIPSTICK
Bilirubin, UA: NEGATIVE
Glucose, UA: NEGATIVE
Ketones, UA: NEGATIVE
Nitrite, UA: NEGATIVE
Protein, UA: POSITIVE — AB
Spec Grav, UA: 1.02 (ref 1.010–1.025)
Urobilinogen, UA: 0.2 E.U./dL
pH, UA: 6 (ref 5.0–8.0)

## 2023-01-07 NOTE — Progress Notes (Signed)
HPI:      [name] is a 27 y.o. [id number] who LMP was Patient's last menstrual period was 04/13/2022 (exact date).  Subjective:   She presents today 1 week from cesarean delivery.  She reports that her incision is doing well.  The dressing came off yesterday.  She is having some pain but is not unmanageable.  Of significant note, she has not been using her insulin or taking antihypertensive medication in the last week. She is breast-feeding and pumping and reports the baby is doing well.  She says that the baby is up all night and she is not getting much sleep.    Hx: The following portions of the patient's history were reviewed and updated as appropriate:             She  has a past medical history of Anemia, Anxiety and depression, Asthma, Blood transfusion without reported diagnosis, Gestational diabetes, preeclampsia, prior pregnancy, currently pregnant, Hypertension, Macrosomia, Panic disorder, Postpartum care following cesarean delivery (12/26/2016), and Shoulder dystocia, delivered. She does not have any pertinent problems on file. She  has a past surgical history that includes Cesarean section (N/A, 12/23/2016); Wisdom tooth extraction; and Cesarean section (N/A, 12/31/2022). Her family history includes Autism spectrum disorder in her brother; Diabetes in her maternal grandfather, mother, and paternal grandmother; Epilepsy in her mother; Hypertension in her brother, father, maternal grandmother, and paternal grandfather; Other in her paternal grandmother; Sickle cell anemia in her sister; Thyroid disease in her mother. She  reports that she has never smoked. She has never used smokeless tobacco. She reports that she does not drink alcohol and does not use drugs. She has a current medication list which includes the following prescription(s): ibuprofen, multivitamin-prenatal, accu-chek guide, insulin detemir, insulin syringe-needle u-100, and oxycodone. She is allergic to albuterol,  benadryl [diphenhydramine], buspar [buspirone], strawberry extract, and milk-related compounds.       Review of Systems:  Review of Systems  Constitutional: Denied constitutional symptoms, night sweats, recent illness, fatigue, fever, insomnia and weight loss.  Eyes: Denied eye symptoms, eye pain, photophobia, vision change and visual disturbance.  Ears/Nose/Throat/Neck: Denied ear, nose, throat or neck symptoms, hearing loss, nasal discharge, sinus congestion and sore throat.  Cardiovascular: Denied cardiovascular symptoms, arrhythmia, chest pain/pressure, edema, exercise intolerance, orthopnea and palpitations.  Respiratory: Denied pulmonary symptoms, asthma, pleuritic pain, productive sputum, cough, dyspnea and wheezing.  Gastrointestinal: Denied, gastro-esophageal reflux, melena, nausea and vomiting.  Genitourinary: Denied genitourinary symptoms including symptomatic vaginal discharge, pelvic relaxation issues, and urinary complaints.  Musculoskeletal: Denied musculoskeletal symptoms, stiffness, swelling, muscle weakness and myalgia.  Dermatologic: Denied dermatology symptoms, rash and scar.  Neurologic: Denied neurology symptoms, dizziness, headache, neck pain and syncope.  Psychiatric: Denied psychiatric symptoms, anxiety and depression.  Endocrine: Denied endocrine symptoms including hot flashes and night sweats.   Meds:   Current Outpatient Medications on File Prior to Visit  Medication Sig Dispense Refill   ibuprofen (ADVIL) 600 MG tablet Take 1 tablet (600 mg total) by mouth every 6 (six) hours. 30 tablet 0   Prenatal Vit-Fe Fumarate-FA (MULTIVITAMIN-PRENATAL) 27-0.8 MG TABS tablet Take 1 tablet by mouth daily at 12 noon.     glucose blood (ACCU-CHEK GUIDE) test strip 1 each by Other route in the morning, at noon, in the evening, and at bedtime. Check blood glucose four times a day: fasting and 2 hours after breakfast, lunch, and dinner (Patient not taking: Reported on 01/07/2023) 100  each 6   insulin detemir (LEVEMIR) 100 UNIT/ML  injection Inject 0.15 mLs (15 Units total) into the skin at bedtime. (Patient not taking: Reported on 01/07/2023) 10 mL 11   Insulin Syringe-Needle U-100 31G X 5/16" 0.5 ML MISC Use as directed. (Patient not taking: Reported on 01/07/2023) 100 each 1   oxyCODONE (OXY IR/ROXICODONE) 5 MG immediate release tablet Take 1 tablet (5 mg total) by mouth every 6 (six) hours as needed for severe pain. (Patient not taking: Reported on 01/07/2023) 30 tablet 0   No current facility-administered medications on file prior to visit.      Objective:     Vitals:   01/07/23 1114  BP: (!) 154/92  Pulse: 75   Filed Weights   01/07/23 1114  Weight: 226 lb 8 oz (102.7 kg)               Abdomen: Soft.  Non-tender.  No masses.  No HSM.  Incision/s: Intact.  Healing well.  No erythema.  No drainage.   Area under panniculus remains moist          Assessment:    [id number] Patient Active Problem List   Diagnosis Date Noted   Term pregnancy, repeat 12/31/2022   [redacted] weeks gestation of pregnancy 12/24/2022   [redacted] weeks gestation of pregnancy 12/20/2022   Hypertension in pregnancy, antepartum 12/15/2022   History of cesarean delivery affecting pregnancy 08/24/2022   History of macrosomia in infant in prior pregnancy, currently pregnant 08/24/2022   History of shoulder dystocia in prior pregnancy 08/24/2022   Iron deficiency anemia 12/26/2016   Maternal varicella, non-immune 10/05/2016   Anemia of pregnancy in third trimester 10/05/2016   Hx of preeclampsia, prior pregnancy, currently pregnant 05/21/2015     1. Postoperative state   2. Postpartum care following cesarean delivery   3. Maternal chronic hypertension, third trimester   4. Pre-existing type 2 diabetes mellitus during pregnancy, antepartum   5. Supervision of high risk pregnancy, antepartum   6. Obesity affecting pregnancy, antepartum, unspecified obesity type     Patient not compliant with  antihypertensive or insulin.   Plan:            1.  Patient to restart long-acting insulin in the evening as directed (20 units)  2.  4 times daily sugar log  3.  Restart labetalol twice daily  4.  Keep incision dry by putting clean cloth under her panniculus  I plan close follow-up of all of these items in 1 week.  Importance of the above stressed to the patient. Orders Orders Placed This Encounter  Procedures   POCT urinalysis dipstick    No orders of the defined types were placed in this encounter.     F/U  Return in about 5 weeks (around 02/11/2023).  Elonda Huskyavid J. Lenvil Swaim, M.D. 01/07/2023 11:20 AM

## 2023-01-07 NOTE — Progress Notes (Signed)
Patient presents today for 1 week postpartum follow-up. Patient had a cesarean delivery on 3/29.  She is breast feeding and pumping. She states she would not like anything for birth control. EPDS score of 0. She states no other questions or concerns at this time.

## 2023-01-13 ENCOUNTER — Ambulatory Visit (INDEPENDENT_AMBULATORY_CARE_PROVIDER_SITE_OTHER): Payer: Medicaid Other | Admitting: Obstetrics and Gynecology

## 2023-01-13 ENCOUNTER — Encounter: Payer: Self-pay | Admitting: Obstetrics and Gynecology

## 2023-01-13 DIAGNOSIS — R519 Headache, unspecified: Secondary | ICD-10-CM | POA: Diagnosis not present

## 2023-01-13 DIAGNOSIS — O10913 Unspecified pre-existing hypertension complicating pregnancy, third trimester: Secondary | ICD-10-CM

## 2023-01-13 DIAGNOSIS — O9089 Other complications of the puerperium, not elsewhere classified: Secondary | ICD-10-CM

## 2023-01-13 DIAGNOSIS — O24119 Pre-existing diabetes mellitus, type 2, in pregnancy, unspecified trimester: Secondary | ICD-10-CM

## 2023-01-13 LAB — POCT URINALYSIS DIPSTICK
Bilirubin, UA: NEGATIVE
Glucose, UA: NEGATIVE
Ketones, UA: NEGATIVE
Leukocytes, UA: NEGATIVE
Nitrite, UA: NEGATIVE
Protein, UA: POSITIVE — AB
Spec Grav, UA: 1.015 (ref 1.010–1.025)
Urobilinogen, UA: 0.2 E.U./dL
pH, UA: 6 (ref 5.0–8.0)

## 2023-01-13 NOTE — Progress Notes (Signed)
HPI:      [name] is a 27 y.o. [phone] who LMP was No LMP recorded.  Subjective:   She presents today 2 weeks from cesarean delivery.  Prenatal care and postpartum have been complicated by hypertension and diabetes.  She presents today for follow-up.  She is currently using 20 units of long-acting insulin in the evening and is keeping a sugar log.  She is taking 200 of labetalol twice daily and doing blood pressures at home. She continues to report some swelling in her lower extremities especially at the end of the day.  She has daily headaches but can function.    Hx: The following portions of the patient's history were reviewed and updated as appropriate:             She  has a past medical history of Anemia, Anxiety and depression, Asthma, Blood transfusion without reported diagnosis, Gestational diabetes, preeclampsia, prior pregnancy, currently pregnant, Hypertension, Macrosomia, Panic disorder, Postpartum care following cesarean delivery (12/26/2016), and Shoulder dystocia, delivered. She does not have any pertinent problems on file. She  has a past surgical history that includes Cesarean section (N/A, 12/23/2016); Wisdom tooth extraction; and Cesarean section (N/A, 12/31/2022). Her family history includes Autism spectrum disorder in her brother; Diabetes in her maternal grandfather, mother, and paternal grandmother; Epilepsy in her mother; Hypertension in her brother, father, maternal grandmother, and paternal grandfather; Other in her paternal grandmother; Sickle cell anemia in her sister; Thyroid disease in her mother. She  reports that she has never smoked. She has never used smokeless tobacco. She reports that she does not drink alcohol and does not use drugs. She has a current medication list which includes the following prescription(s): accu-chek guide, ibuprofen, insulin detemir, insulin syringe-needle u-100, labetalol, and multivitamin-prenatal. She is allergic to albuterol,  benadryl [diphenhydramine], buspar [buspirone], strawberry extract, and milk-related compounds.       Review of Systems:  Review of Systems  Constitutional: Denied constitutional symptoms, night sweats, recent illness, fatigue, fever, insomnia and weight loss.  Eyes: Denied eye symptoms, eye pain, photophobia, vision change and visual disturbance.  Ears/Nose/Throat/Neck: Denied ear, nose, throat or neck symptoms, hearing loss, nasal discharge, sinus congestion and sore throat.  Cardiovascular: Denied cardiovascular symptoms, arrhythmia, chest pain/pressure, edema, exercise intolerance, orthopnea and palpitations.  Respiratory: Denied pulmonary symptoms, asthma, pleuritic pain, productive sputum, cough, dyspnea and wheezing.  Gastrointestinal: Denied, gastro-esophageal reflux, melena, nausea and vomiting.  Genitourinary: Denied genitourinary symptoms including symptomatic vaginal discharge, pelvic relaxation issues, and urinary complaints.  Musculoskeletal: Denied musculoskeletal symptoms, stiffness, swelling, muscle weakness and myalgia.  Dermatologic: Denied dermatology symptoms, rash and scar.  Neurologic: See HPI for additional information.  Psychiatric: Denied psychiatric symptoms, anxiety and depression.  Endocrine: Denied endocrine symptoms including hot flashes and night sweats.   Meds:   Current Outpatient Medications on File Prior to Visit  Medication Sig Dispense Refill   glucose blood (ACCU-CHEK GUIDE) test strip 1 each by Other route in the morning, at noon, in the evening, and at bedtime. Check blood glucose four times a day: fasting and 2 hours after breakfast, lunch, and dinner 100 each 6   ibuprofen (ADVIL) 600 MG tablet Take 1 tablet (600 mg total) by mouth every 6 (six) hours. 30 tablet 0   insulin detemir (LEVEMIR) 100 UNIT/ML injection Inject 0.15 mLs (15 Units total) into the skin at bedtime. (Patient taking differently: Inject 20 Units into the skin at bedtime.) 10 mL 11    Insulin Syringe-Needle U-100 31G  X 5/16" 0.5 ML MISC Use as directed. 100 each 1   labetalol (NORMODYNE) 200 MG tablet Take 200 mg by mouth 2 (two) times daily.     Prenatal Vit-Fe Fumarate-FA (MULTIVITAMIN-PRENATAL) 27-0.8 MG TABS tablet Take 1 tablet by mouth daily at 12 noon.     No current facility-administered medications on file prior to visit.      Objective:     Vitals:   01/13/23 1115 01/13/23 1134  BP: (!) 141/91 134/83  Pulse: 83    Filed Weights   01/13/23 1115  Weight: 223 lb 8 oz (101.4 kg)                        Assessment:    [id number] Patient Active Problem List   Diagnosis Date Noted   Term pregnancy, repeat 12/31/2022   [redacted] weeks gestation of pregnancy 12/24/2022   [redacted] weeks gestation of pregnancy 12/20/2022   Hypertension in pregnancy, antepartum 12/15/2022   History of cesarean delivery affecting pregnancy 08/24/2022   History of macrosomia in infant in prior pregnancy, currently pregnant 08/24/2022   History of shoulder dystocia in prior pregnancy 08/24/2022   Iron deficiency anemia 12/26/2016   Maternal varicella, non-immune 10/05/2016   Anemia of pregnancy in third trimester 10/05/2016   Hx of preeclampsia, prior pregnancy, currently pregnant 05/21/2015     1. Postpartum care following cesarean delivery   2. Maternal chronic hypertension, third trimester   3. Pre-existing type 2 diabetes mellitus during pregnancy, antepartum   4. Postpartum headache     Review of her sugar log shows excellent glycemic control.  The blood pressure she describes at home are similar to the ones today in the office.  Relatively good control of hypertension with only labetalol.   Plan:            1.  Continue long-acting nighttime insulin at same dose  2.  Continue labetalol twice daily at same dose  3.  Patient encouraged to alternate Motrin and Tylenol for headaches as well as increased p.o. fluid intake. Orders Orders Placed This Encounter  Procedures    POCT urinalysis dipstick    No orders of the defined types were placed in this encounter.     F/U  Return in about 4 weeks (around 02/10/2023).  Elonda Husky, M.D. 01/13/2023 11:51 AM

## 2023-01-13 NOTE — Progress Notes (Signed)
Patient presents today for a 2 week postpartum medication check. She states checking her sugars at home, brought log today and is using 20 units at night. Home BP readings remain increased along with having headaches and bilateral swelling. She states taking Labetalol 200 mg bid.

## 2023-01-19 ENCOUNTER — Telehealth: Payer: Self-pay

## 2023-01-19 NOTE — Telephone Encounter (Signed)
[facility]- Discharge Call Backs-Left Voicemail about the following below. 1-Do you have any questions or concerns about yourself as you heal? 2-Any concerns or questions about your baby? 3-How was your stay at the hospital? 4- Did our team work together to care for you? You should be receiving a survey in the mail soon.   We would really appreciate it if you could fill that out for us and return it in the mail.  We value the feedback to make improvements and continue the great work we do.   If you have any questions please feel free to call me back at [phone]  

## 2023-02-15 ENCOUNTER — Encounter: Payer: Self-pay | Admitting: Obstetrics and Gynecology

## 2023-02-15 ENCOUNTER — Ambulatory Visit (INDEPENDENT_AMBULATORY_CARE_PROVIDER_SITE_OTHER): Payer: Medicaid Other | Admitting: Obstetrics and Gynecology

## 2023-02-15 VITALS — BP 124/84 | HR 97 | Ht 64.0 in | Wt 224.9 lb

## 2023-02-15 DIAGNOSIS — O24439 Gestational diabetes mellitus in the puerperium, unspecified control: Secondary | ICD-10-CM

## 2023-02-15 DIAGNOSIS — Z9889 Other specified postprocedural states: Secondary | ICD-10-CM

## 2023-02-15 NOTE — Progress Notes (Signed)
HPI:      Erika Solomon is a 27 y.o. [phone] who LMP was No LMP recorded.  Subjective:   She presents today 6 weeks postop and postpartum.  She reports no problems.  She is not having pain.  She is breast-feeding full-time but would like a consult for lactation because she believes that the baby is not getting enough milk. She has been taking her labetalol twice a day. She is using 15 units of long-acting insulin nightly.  She reports her sugars as less than 100 as fasting and usually 115 or so during the day.    Hx: The following portions of the patient's history were reviewed and updated as appropriate:             She  has a past medical history of Anemia, Anxiety and depression, Asthma, Blood transfusion without reported diagnosis, Gestational diabetes, preeclampsia, prior pregnancy, currently pregnant, Hypertension, Macrosomia, Panic disorder, Postpartum care following cesarean delivery (12/26/2016), and Shoulder dystocia, delivered. She does not have any pertinent problems on file. She  has a past surgical history that includes Cesarean section (N/A, 12/23/2016); Wisdom tooth extraction; and Cesarean section (N/A, 12/31/2022). Her family history includes Autism spectrum disorder in her brother; Diabetes in her maternal grandfather, mother, and paternal grandmother; Epilepsy in her mother; Hypertension in her brother, father, maternal grandmother, and paternal grandfather; Other in her paternal grandmother; Sickle cell anemia in her sister; Thyroid disease in her mother. She  reports that she has never smoked. She has never used smokeless tobacco. She reports that she does not drink alcohol and does not use drugs. She has a current medication list which includes the following prescription(s): accu-chek guide, ibuprofen, insulin detemir, insulin syringe-needle u-100, labetalol, and multivitamin-prenatal. She is allergic to albuterol, benadryl [diphenhydramine], buspar [buspirone],  strawberry extract, and milk-related compounds.       Review of Systems:  Review of Systems  Constitutional: Denied constitutional symptoms, night sweats, recent illness, fatigue, fever, insomnia and weight loss.  Eyes: Denied eye symptoms, eye pain, photophobia, vision change and visual disturbance.  Ears/Nose/Throat/Neck: Denied ear, nose, throat or neck symptoms, hearing loss, nasal discharge, sinus congestion and sore throat.  Cardiovascular: Denied cardiovascular symptoms, arrhythmia, chest pain/pressure, edema, exercise intolerance, orthopnea and palpitations.  Respiratory: Denied pulmonary symptoms, asthma, pleuritic pain, productive sputum, cough, dyspnea and wheezing.  Gastrointestinal: Denied, gastro-esophageal reflux, melena, nausea and vomiting.  Genitourinary: Denied genitourinary symptoms including symptomatic vaginal discharge, pelvic relaxation issues, and urinary complaints.  Musculoskeletal: Denied musculoskeletal symptoms, stiffness, swelling, muscle weakness and myalgia.  Dermatologic: Denied dermatology symptoms, rash and scar.  Neurologic: Denied neurology symptoms, dizziness, headache, neck pain and syncope.  Psychiatric: Denied psychiatric symptoms, anxiety and depression.  Endocrine: Denied endocrine symptoms including hot flashes and night sweats.   Meds:   Current Outpatient Medications on File Prior to Visit  Medication Sig Dispense Refill   glucose blood (ACCU-CHEK GUIDE) test strip 1 each by Other route in the morning, at noon, in the evening, and at bedtime. Check blood glucose four times a day: fasting and 2 hours after breakfast, lunch, and dinner 100 each 6   ibuprofen (ADVIL) 600 MG tablet Take 1 tablet (600 mg total) by mouth every 6 (six) hours. 30 tablet 0   insulin detemir (LEVEMIR) 100 UNIT/ML injection Inject 0.15 mLs (15 Units total) into the skin at bedtime. (Patient taking differently: Inject 20 Units into the skin at bedtime.) 10 mL 11   Insulin  Syringe-Needle U-100 31G X 5/16"  0.5 ML MISC Use as directed. 100 each 1   labetalol (NORMODYNE) 200 MG tablet Take 200 mg by mouth 2 (two) times daily.     Prenatal Vit-Fe Fumarate-FA (MULTIVITAMIN-PRENATAL) 27-0.8 MG TABS tablet Take 1 tablet by mouth daily at 12 noon.     No current facility-administered medications on file prior to visit.      Objective:     Vitals:   02/15/23 1043 02/15/23 1056  BP: (!) 135/90 124/84  Pulse: 97    Filed Weights   02/15/23 1043  Weight: 224 lb 14.4 oz (102 kg)               Abdomen: Soft.  Non-tender.  No masses.  No HSM.  Incision/s: Intact.  Healing well.  No erythema.  No drainage.             Assessment:    [id number] Patient Active Problem List   Diagnosis Date Noted   Term pregnancy, repeat 12/31/2022   [redacted] weeks gestation of pregnancy 12/24/2022   [redacted] weeks gestation of pregnancy 12/20/2022   Hypertension in pregnancy, antepartum 12/15/2022   History of cesarean delivery affecting pregnancy 08/24/2022   History of macrosomia in infant in prior pregnancy, currently pregnant 08/24/2022   History of shoulder dystocia in prior pregnancy 08/24/2022   Iron deficiency anemia 12/26/2016   Maternal varicella, non-immune 10/05/2016   Anemia of pregnancy in third trimester 10/05/2016   Hx of preeclampsia, prior pregnancy, currently pregnant 05/21/2015     1. Postoperative state   2. Postpartum care following cesarean delivery   3. Difficulty of mother performing breastfeeding     Insulin currently controlling her sugars (question whether she is a diabetic outside of pregnancy)  Labetalol controlling blood pressures.  Plan:            1.  Patient may resume normal activities with exception of heavy lifting.  2.  Patient to drop down to 100 mg of labetalol for the next 2 weeks -monitor blood sugars at home.  If they remain in the normal range can discontinue labetalol completely after that.  3.  Plan 2-hour 75 g load GTT patient  told to abstain from nightly dose of insulin the night prior to the test.   Orders Orders Placed This Encounter  Procedures   Ambulatory referral to Lactation    No orders of the defined types were placed in this encounter.     F/U  Return in about 3 months (around 05/18/2023) for Annual Physical.  Elonda Husky, M.D. 02/15/2023 11:41 AM

## 2023-02-15 NOTE — Progress Notes (Signed)
Patient presents today for 6 week postpartum follow-up. Patient had a cesarean delivery on 12/31/22. She is breast and bottle feeding, reports wanting a referral to lactation for supply concerns. She states she would not like anything for birth control at this time. Reports she has been checking her sugars  at home, reports they are good but did not bring log. EPDS score of 0. She states no other questions or concerns at this time.

## 2023-02-17 ENCOUNTER — Telehealth: Payer: Self-pay

## 2023-02-17 NOTE — Telephone Encounter (Signed)
Lactation Note:  Received lactation referral to schedule patient for outpatient lactation consult. Attempted to reach patient. Voicemail left requesting patient to call LC back to schedule outpatient appointment.  Lonzo Cloud RN, IBCLC

## 2023-02-18 ENCOUNTER — Other Ambulatory Visit: Payer: Medicaid Other

## 2023-02-18 DIAGNOSIS — O24439 Gestational diabetes mellitus in the puerperium, unspecified control: Secondary | ICD-10-CM

## 2023-02-19 LAB — GLUCOSE TOLERANCE, 2 HOURS
Glucose, 2 hour: 96 mg/dL (ref 70–139)
Glucose, GTT - Fasting: 92 mg/dL (ref 70–99)

## 2023-04-05 ENCOUNTER — Encounter: Payer: Self-pay | Admitting: Emergency Medicine

## 2023-04-05 ENCOUNTER — Emergency Department
Admission: EM | Admit: 2023-04-05 | Discharge: 2023-04-05 | Disposition: A | Discharge status: Home / Self Care | Payer: Medicaid Other | Attending: Emergency Medicine | Admitting: Emergency Medicine

## 2023-04-05 ENCOUNTER — Other Ambulatory Visit: Payer: Self-pay

## 2023-04-05 ENCOUNTER — Emergency Department: Payer: Medicaid Other

## 2023-04-05 DIAGNOSIS — R300 Dysuria: Secondary | ICD-10-CM | POA: Diagnosis present

## 2023-04-05 DIAGNOSIS — N12 Tubulo-interstitial nephritis, not specified as acute or chronic: Secondary | ICD-10-CM | POA: Insufficient documentation

## 2023-04-05 LAB — CBC
HCT: 33.6 % — ABNORMAL LOW (ref 36.0–46.0)
Hemoglobin: 11.1 g/dL — ABNORMAL LOW (ref 12.0–15.0)
MCH: 27.2 pg (ref 26.0–34.0)
MCHC: 33 g/dL (ref 30.0–36.0)
MCV: 82.4 fL (ref 80.0–100.0)
Platelets: 195 10*3/uL (ref 150–400)
RBC: 4.08 MIL/uL (ref 3.87–5.11)
RDW: 17.1 % — ABNORMAL HIGH (ref 11.5–15.5)
WBC: 8.1 10*3/uL (ref 4.0–10.5)
nRBC: 0 % (ref 0.0–0.2)

## 2023-04-05 LAB — URINALYSIS, ROUTINE W REFLEX MICROSCOPIC
Bilirubin Urine: NEGATIVE
Glucose, UA: NEGATIVE mg/dL
Hgb urine dipstick: NEGATIVE
Ketones, ur: NEGATIVE mg/dL
Nitrite: NEGATIVE
Protein, ur: 100 mg/dL — AB
Specific Gravity, Urine: 1.013 (ref 1.005–1.030)
Squamous Epithelial / HPF: >50 /HPF (ref 0–5)
WBC, UA: >50 WBC/hpf (ref 0–5)
pH: 6 (ref 5.0–8.0)

## 2023-04-05 LAB — COMPREHENSIVE METABOLIC PANEL
ALT: 72 U/L — ABNORMAL HIGH (ref 0–44)
AST: 59 U/L — ABNORMAL HIGH (ref 15–41)
Albumin: 4.1 g/dL (ref 3.5–5.0)
Alkaline Phosphatase: 69 U/L (ref 38–126)
Anion gap: 11 (ref 5–15)
BUN: 11 mg/dL (ref 6–20)
CO2: 22 mmol/L (ref 22–32)
Calcium: 8.7 mg/dL — ABNORMAL LOW (ref 8.9–10.3)
Chloride: 101 mmol/L (ref 98–111)
Creatinine, Ser: 0.87 mg/dL (ref 0.44–1.00)
GFR, Estimated: >60 mL/min (ref >60)
Glucose, Bld: 113 mg/dL — ABNORMAL HIGH (ref 70–99)
Potassium: 3.4 mmol/L — ABNORMAL LOW (ref 3.5–5.1)
Sodium: 134 mmol/L — ABNORMAL LOW (ref 135–145)
Total Bilirubin: 1 mg/dL (ref 0.3–1.2)
Total Protein: 8.1 g/dL (ref 6.5–8.1)

## 2023-04-05 LAB — LACTIC ACID, PLASMA
Lactic Acid, Venous: 0.9 mmol/L (ref 0.5–1.9)
Lactic Acid, Venous: 1 mmol/L (ref 0.5–1.9)

## 2023-04-05 MED ORDER — FLUCONAZOLE 150 MG PO TABS: 150.0000 mg | ORAL_TABLET | Freq: Every day | ORAL | 0 refills | Status: AC

## 2023-04-05 MED ORDER — KETOROLAC TROMETHAMINE 30 MG/ML IJ SOLN
30.0000 mg | Freq: Once | INTRAMUSCULAR | Status: AC
  Administered 2023-04-05: 30 mg via INTRAVENOUS
  Filled 2023-04-05: qty 1

## 2023-04-05 MED ORDER — SODIUM CHLORIDE 0.9 % IV SOLN: Freq: Once | INTRAVENOUS | Status: AC

## 2023-04-05 MED ORDER — TRAMADOL HCL 50 MG PO TABS: 50.0000 mg | ORAL_TABLET | Freq: Four times a day (QID) | ORAL | 0 refills | Status: DC | PRN

## 2023-04-05 MED ORDER — SODIUM CHLORIDE 0.9 % IV SOLN
1.0000 g | Freq: Once | INTRAVENOUS | Status: AC
  Administered 2023-04-05: 1 g via INTRAVENOUS
  Filled 2023-04-05: qty 10

## 2023-04-05 MED ORDER — ONDANSETRON 4 MG PO TBDP: 4.0000 mg | ORAL_TABLET | Freq: Three times a day (TID) | ORAL | 0 refills | Status: DC | PRN

## 2023-04-05 MED ORDER — CEFDINIR 300 MG PO CAPS: 300.0000 mg | ORAL_CAPSULE | Freq: Two times a day (BID) | ORAL | 0 refills | Status: AC

## 2023-04-05 NOTE — ED Triage Notes (Signed)
Pt sts that she has been having very high fevers. Pt sts that her highest fever was 105.4. Pt sts that she has been having burning with urination and right sided abd pain. Pt is 3 months post partum.

## 2023-04-05 NOTE — ED Provider Notes (Signed)
[facility] Provider Note    Event Date/Time   First MD Initiated Contact with Patient 04/05/23 1751     (approximate)   History   Fever   HPI  Erika Solomon is a 27 y.o. female who presents with complaints of dysuria, right back pain for several days.  She has had fevers and feels weak.  She is 3 months postpartum, baby has breast-fed and bottle-fed.  Mild nausea.  No history of kidney stones.  No cough or shortness of breath   Physical Exam   Triage Vital Signs: ED Triage Vitals  Enc Vitals Group     BP 04/05/23 1733 (!) 136/93     Pulse Rate 04/05/23 1733 (!) 134     Resp 04/05/23 1733 16     Temp 04/05/23 1733 (!) 100.6 F (38.1 C)     Temp Source 04/05/23 1733 Oral     SpO2 04/05/23 1733 98 %     Weight 04/05/23 1735 99.8 kg (220 lb)     Height 04/05/23 1735 1.626 m (5\' 4" )     Head Circumference --      Peak Flow --      Pain Score 04/05/23 1817 7     Pain Loc --      Pain Edu? --      Excl. in GC? --     Most recent vital signs: Vitals:   04/05/23 1733 04/05/23 1917  BP: (!) 136/93   Pulse: (!) 134 (!) 106  Resp: 16   Temp: (!) 100.6 F (38.1 C) 98.9 F (37.2 C)  SpO2: 98%      General: Awake, no distress.  CV:  Good peripheral perfusion.  Tachycardic Resp:  Normal effort.  Abd:  No distention.  Positive right CVA tenderness Other:     ED Results / Procedures / Treatments   Labs (all labs ordered are listed, but only abnormal results are displayed) Labs Reviewed  CBC - Abnormal; Notable for the following components:      Result Value   Hemoglobin 11.1 (*)    HCT 33.6 (*)    RDW 17.1 (*)    All other components within normal limits  COMPREHENSIVE METABOLIC PANEL - Abnormal; Notable for the following components:   Sodium 134 (*)    Potassium 3.4 (*)    Glucose, Bld 113 (*)    Calcium 8.7 (*)    AST 59 (*)    ALT 72 (*)    All other components within normal limits  URINALYSIS, ROUTINE W REFLEX MICROSCOPIC -  Abnormal; Notable for the following components:   Color, Urine YELLOW (*)    APPearance CLOUDY (*)    Protein, ur 100 (*)    Leukocytes,Ua LARGE (*)    Bacteria, UA MANY (*)    All other components within normal limits  CULTURE, BLOOD (SINGLE)  URINE CULTURE  LACTIC ACID, PLASMA  LACTIC ACID, PLASMA  PREGNANCY, URINE  PREGNANCY, URINE     EKG     RADIOLOGY CT renal stone study pending    PROCEDURES:  Critical Care performed:   Procedures   MEDICATIONS ORDERED IN ED: Medications  0.9 %  sodium chloride infusion ( Intravenous New Bag/Given 04/05/23 1817)  ketorolac (TORADOL) 30 MG/ML injection 30 mg (30 mg Intravenous Given 04/05/23 1816)  cefTRIAXone (ROCEPHIN) 1 g in sodium chloride 0.9 % 100 mL IVPB (1 g Intravenous New Bag/Given 04/05/23 1817)     IMPRESSION / MDM /  ASSESSMENT AND PLAN / ED COURSE  I reviewed the triage vital signs and the nursing notes. Patient's presentation is most consistent with acute presentation with potential threat to life or bodily function.  Patient presents with tachycardia, fever, dysuria as detailed above, differential includes UTI, pyelonephritis, infected kidney stone  Pending labs, will treat with IV fluids, IV Toradol (have asked patient to use only formula in the upcoming days), IV Rocephin for presumed UTI  Urinalysis is consistent with urinary tract infection, lab work demonstrates normal lactic acid, not consistent with sepsis, likely pyelonephritis however.  Pending CT renal stone study  ----------------------------------------- 7:14 PM on 04/05/2023 ----------------------------------------- CT scan demonstrates stranding around the right kidney consistent with pyelonephritis as expected, no kidney stones.  Had a discussion with patient, she has a 90-month-old and refuses to stay in the hospital although I recommended this.  She would like to be discharged with antibiotics and understands that her symptoms may not be treated  successfully and aware that this could be particularly dangerous for her and could progress to sepsis.  She knows that she can return at any time.  She does have decisional capacity.  Heart rate greatly improved after treatment.      FINAL CLINICAL IMPRESSION(S) / ED DIAGNOSES   Final diagnoses:  Pyelonephritis     Rx / DC Orders   ED Discharge Orders          Ordered    cefdinir (OMNICEF) 300 MG capsule  2 times daily        04/05/23 1910    ondansetron (ZOFRAN-ODT) 4 MG disintegrating tablet  Every 8 hours PRN        04/05/23 1910    traMADol (ULTRAM) 50 MG tablet  Every 6 hours PRN        04/05/23 1910    fluconazole (DIFLUCAN) 150 MG tablet  Daily        04/05/23 1919             Note:  This document was prepared using Dragon voice recognition software and may include unintentional dictation errors.   Jene Every, MD 04/05/23 1919

## 2023-04-06 LAB — URINE CULTURE: Culture: >=100000 — AB

## 2023-04-07 LAB — URINE CULTURE

## 2023-04-10 LAB — CULTURE, BLOOD (SINGLE): Culture: NO GROWTH

## 2023-07-26 ENCOUNTER — Telehealth: Payer: Self-pay | Admitting: Obstetrics and Gynecology

## 2023-07-26 NOTE — Telephone Encounter (Signed)
Called patient to set up annual appointment with DR. EVANS.  If patient calls back please schedule her an annual

## 2023-07-27 NOTE — Telephone Encounter (Signed)
I

## 2024-01-13 ENCOUNTER — Ambulatory Visit: Admitting: Physician Assistant

## 2024-01-13 ENCOUNTER — Encounter: Payer: Self-pay | Admitting: Physician Assistant

## 2024-01-13 VITALS — BP 136/96 | HR 87 | Temp 98.2°F | Ht 64.0 in | Wt 237.0 lb

## 2024-01-13 DIAGNOSIS — Z6841 Body Mass Index (BMI) 40.0 and over, adult: Secondary | ICD-10-CM

## 2024-01-13 DIAGNOSIS — I10 Essential (primary) hypertension: Secondary | ICD-10-CM

## 2024-01-13 DIAGNOSIS — E66813 Obesity, class 3: Secondary | ICD-10-CM | POA: Diagnosis not present

## 2024-01-13 DIAGNOSIS — Z8632 Personal history of gestational diabetes: Secondary | ICD-10-CM | POA: Insufficient documentation

## 2024-01-13 DIAGNOSIS — D509 Iron deficiency anemia, unspecified: Secondary | ICD-10-CM | POA: Diagnosis not present

## 2024-01-13 DIAGNOSIS — E66812 Morbid (severe) obesity due to excess calories: Secondary | ICD-10-CM | POA: Insufficient documentation

## 2024-01-13 NOTE — Assessment & Plan Note (Signed)
 Check A1c to evaluate for nongestational diabetes

## 2024-01-13 NOTE — Assessment & Plan Note (Signed)
 Discussed both lifestyle and pharmacological measures that can be taken to address obesity.  It sounds like she still has some room for improvement when it comes to physical activity, especially when it comes to strength/resistance training, but even still she is interested in medication.  As far as weight loss medications are concerned, we will assess for comorbidities today via labs, but briefly discussed the following options:   1. Appetite suppressants -medications like phentermine or Qsymia suppress the appetite.  These medications are stimulants, so they can cause some increase in blood pressure, insomnia, anxiety. Typically they are only used for intervals of 3 months at a time, followed by taper off. The idea is to jump start the weight loss journey, but rarely used long-term  2. ZOX0RU - often used for diabetes (Ozempic, Mounjaro) or for severe obesity (Saxenda, Eldorado, Zepbound). Powerful once weekly injectable medications which are self-administered and produce appetite suppression, slow the gut, and improve the body's use of sugar. Main side effects are stomach/bowel related to include nausea, diarrhea, bloating, acid reflux.  3. Metformin -Typically used for patients with diabetes or prediabetes to improve your blood sugar and may result in modest weight loss. Main side effects are nausea and diarrhea but these tend to improve after 1-2 weeks of use.  4. Bupropion -sometimes used for depression or to quit smoking, but can be used off-label to produce modest weight loss. Upgraded form is Contrave which has appetite suppressant as well.

## 2024-01-13 NOTE — Progress Notes (Signed)
 Date:  01/13/2024   Name:  Erika Solomon   DOB:  06-Jul-1996   MRN:  [id number]   Chief Complaint: Establish Care and Weight Loss (Never been on medication, since having a baby hard to lose weight has cut out sugary drinks/ sodas)  HPI [name] is a pleasant 28 year old female with a history of obesity, gestational HTN, gestational diabetes who presents to the clinic today to establish care.  Primarily she wants to discuss weight management options.  Strong family history of obesity, patient has gradually gained weight over time, particularly difficult to lose following her most recent pregnancy which was her fourth child (now 1-year-old).  Over the last several months she has been working on dietary changes to include reduction of processed sugars through beverages and sweets.  She typically eats 2 meals per day.  She has a treadmill at home along with a set of hand weights, exercises some but no dedicated routine.  Moving throughout the day taking care of her 4 children.  She has never used pharmacotherapy for weight loss, but would be willing to consider this.  She is not currently breast-feeding.   Medication list has been reviewed and updated.  Current Meds  Medication Sig   ondansetron (ZOFRAN-ODT) 4 MG disintegrating tablet Take 1 tablet (4 mg total) by mouth every 8 (eight) hours as needed for nausea or vomiting.     Review of Systems  Patient Active Problem List   Diagnosis Date Noted   Class 3 severe obesity in adult [facility]) 01/13/2024   Primary hypertension 01/13/2024   History of gestational diabetes 01/13/2024   Iron deficiency anemia 12/26/2016    Allergies  Allergen Reactions   Albuterol Anaphylaxis   Benadryl [Diphenhydramine] Anaphylaxis   Buspar [Buspirone] Anaphylaxis   Strawberry Extract Anaphylaxis   Milk-Related Compounds Nausea And Vomiting    Immunization History  Administered Date(s) Administered   Influenza,inj,Quad PF,6+ Mos 09/30/2016   MMR  01/02/2023   Moderna Covid-19 Vaccine Bivalent Booster 68yrs & up 10/09/2020   PPD Test 11/15/2021   Tdap 10/12/2022   Varicella 01/02/2023    Past Surgical History:  Procedure Laterality Date   CESAREAN SECTION N/A 12/23/2016   Procedure: CESAREAN SECTION;  Surgeon: Vena Austria, MD;  Location: [facility] ORS;  Service: Obstetrics;  Laterality: N/A;   CESAREAN SECTION N/A 12/31/2022   Procedure: REPEAT CESAREAN DELIVERY;  Surgeon: Linzie Collin, MD;  Location: [facility] ORS;  Service: Obstetrics;  Laterality: N/A;   WISDOM TOOTH EXTRACTION     four; 2018    Social History   Tobacco Use   Smoking status: Never   Smokeless tobacco: Never  Vaping Use   Vaping status: Never Used  Substance Use Topics   Alcohol use: No   Drug use: No    Family History  Problem Relation Age of Onset   Diabetes Mother    Thyroid disease Mother    Epilepsy Mother    Anxiety disorder Mother    Miscarriages / [place] Mother    Hypertension Father    Alcohol abuse Father    Depression Father    Sickle cell anemia Sister    Hypertension Brother    Autism spectrum disorder Brother    ADD / ADHD Brother    Asthma Brother    Intellectual disability Brother    Hypertension Maternal Grandmother    Arthritis Maternal Grandmother    Diabetes Maternal Grandfather    COPD Maternal Grandfather    Stroke Maternal Grandfather  Diabetes Paternal Grandmother    Other Paternal Grandmother        problem c kidneys and liver   Hypertension Paternal Grandfather    ADD / ADHD Son    ADD / ADHD Son    Cancer Maternal Aunt    Intellectual disability Sister         01/13/2024   10:09 AM  GAD 7 : Generalized Anxiety Score  Nervous, Anxious, on Edge 0  Control/stop worrying 0  Worry too much - different things 0  Trouble relaxing 0  Restless 0  Easily annoyed or irritable 0  Afraid - awful might happen 0  Total GAD 7 Score 0  Anxiety Difficulty Not difficult at all       01/13/2024   10:09  AM 12/08/2022   11:01 AM 11/25/2022   10:47 AM  Depression screen PHQ 2/9  Decreased Interest 0 3 0  Down, Depressed, Hopeless 0 3 0  PHQ - 2 Score 0 6 0  Altered sleeping 2 2   Tired, decreased energy 2 3   Change in appetite 2 2   Feeling bad or failure about yourself  2 3   Trouble concentrating 0 3   Moving slowly or fidgety/restless 0 0   Suicidal thoughts 0 0   PHQ-9 Score 8 19   Difficult doing work/chores Not difficult at all Somewhat difficult     BP Readings from Last 3 Encounters:  01/13/24 (!) 136/96  04/05/23 123/72  02/15/23 124/84    Wt Readings from Last 3 Encounters:  01/13/24 237 lb (107.5 kg)  04/05/23 220 lb (99.8 kg)  02/15/23 224 lb 14.4 oz (102 kg)    BP (!) 136/96 (BP Location: Left Arm, Cuff Size: Large)   Pulse 87   Temp 98.2 F (36.8 C)   Ht 5\' 4"  (1.626 m)   Wt 237 lb (107.5 kg)   SpO2 95%   Breastfeeding No   BMI 40.68 kg/m   Physical Exam Vitals and nursing note reviewed.  Constitutional:      Appearance: Normal appearance. She is obese.  Cardiovascular:     Rate and Rhythm: Normal rate.  Pulmonary:     Effort: Pulmonary effort is normal.  Abdominal:     General: There is no distension.  Musculoskeletal:        General: Normal range of motion.  Skin:    General: Skin is warm and dry.  Neurological:     Mental Status: She is alert and oriented to person, place, and time.     Gait: Gait is intact.  Psychiatric:        Mood and Affect: Mood and affect normal.     Recent Labs     Component Value Date/Time   NA 134 (L) 04/05/2023 1744   NA 142 07/11/2019 1425   NA 140 06/19/2014 0450   K 3.4 (L) 04/05/2023 1744   K 4.0 06/19/2014 0450   CL 101 04/05/2023 1744   CL 109 (H) 06/19/2014 0450   CO2 22 04/05/2023 1744   CO2 20 (L) 06/19/2014 0450   GLUCOSE 113 (H) 04/05/2023 1744   GLUCOSE 86 06/19/2014 0450   BUN 11 04/05/2023 1744   BUN 9 07/11/2019 1425   BUN 7 06/19/2014 0450   CREATININE 0.87 04/05/2023 1744    CREATININE 0.65 06/19/2014 0450   CALCIUM 8.7 (L) 04/05/2023 1744   CALCIUM 8.5 (L) 06/19/2014 0450   PROT 8.1 04/05/2023 1744   PROT 6.8 07/11/2019  1425   PROT 6.7 06/19/2014 0450   ALBUMIN 4.1 04/05/2023 1744   ALBUMIN 4.2 07/11/2019 1425   ALBUMIN 2.7 (L) 06/19/2014 0450   AST 59 (H) 04/05/2023 1744   AST 27 (H) 06/19/2014 0450   ALT 72 (H) 04/05/2023 1744   ALT 15 06/19/2014 0450   ALKPHOS 69 04/05/2023 1744   ALKPHOS 127 (H) 06/19/2014 0450   BILITOT 1.0 04/05/2023 1744   BILITOT 0.3 07/11/2019 1425   BILITOT 0.2 06/19/2014 0450   GFRNONAA >60 04/05/2023 1744   GFRNONAA >60 06/19/2014 0450   GFRAA 142 07/11/2019 1425   GFRAA >60 06/19/2014 0450    Lab Results  Component Value Date   WBC 8.1 04/05/2023   HGB 11.1 (L) 04/05/2023   HCT 33.6 (L) 04/05/2023   MCV 82.4 04/05/2023   PLT 195 04/05/2023   Lab Results  Component Value Date   HGBA1C 5.4 06/30/2022   Lab Results  Component Value Date   CHOL 149 06/04/2019   HDL 26 (L) 06/04/2019   LDLCALC 56 06/04/2019   TRIG 443 (H) 06/04/2019   CHOLHDL 5.7 (H) 06/04/2019   Lab Results  Component Value Date   TSH 2.930 06/04/2019     Assessment and Plan:  Class 3 severe obesity with serious comorbidity and body mass index (BMI) of 40.0 to 44.9 in adult, unspecified obesity type [facility]) Assessment & Plan: Discussed both lifestyle and pharmacological measures that can be taken to address obesity.  It sounds like she still has some room for improvement when it comes to physical activity, especially when it comes to strength/resistance training, but even still she is interested in medication.  As far as weight loss medications are concerned, we will assess for comorbidities today via labs, but briefly discussed the following options:   1. Appetite suppressants -medications like phentermine or Qsymia suppress the appetite.  These medications are stimulants, so they can cause some increase in blood pressure, insomnia,  anxiety. Typically they are only used for intervals of 3 months at a time, followed by taper off. The idea is to jump start the weight loss journey, but rarely used long-term  2. QMV7QI - often used for diabetes (Ozempic, Mounjaro) or for severe obesity (Saxenda, [place], Zepbound). Powerful once weekly injectable medications which are self-administered and produce appetite suppression, slow the gut, and improve the body's use of sugar. Main side effects are stomach/bowel related to include nausea, diarrhea, bloating, acid reflux.  3. Metformin -Typically used for patients with diabetes or prediabetes to improve your blood sugar and may result in modest weight loss. Main side effects are nausea and diarrhea but these tend to improve after 1-2 weeks of use.  4. Bupropion -sometimes used for depression or to quit smoking, but can be used off-label to produce modest weight loss. Upgraded form is Contrave which has appetite suppressant as well.   Orders: -     CBC with Differential/Platelet -     Comprehensive metabolic panel with GFR -     TSH -     Hemoglobin A1c -     Lipid panel  Primary hypertension Assessment & Plan: Elevated in clinic x 2 today.  Unclear if this is her baseline or if there is a whitecoat hypertension component.  Advised to monitor blood pressure at home, states she has a cuff.  If still elevated at home, consider pharmacotherapy for this problem next visit.  Orders: -     CBC with Differential/Platelet -  Comprehensive metabolic panel with GFR -     TSH  History of gestational diabetes Assessment & Plan: Check A1c to evaluate for nongestational diabetes  Orders: -     Hemoglobin A1c  Iron deficiency anemia, unspecified iron deficiency anemia type Assessment & Plan: Check CBC and iron labs.  Patient endorses frequently consuming ice, which she has done chronically.  Orders: -     CBC with Differential/Platelet -     Iron, TIBC and Ferritin Panel     Return  in about 4 weeks (around 02/10/2024) for OV f/u weight, HTN.    Alvester Morin, PA-C, DMSc, Nutritionist [facility] [phone]

## 2024-01-13 NOTE — Patient Instructions (Signed)

## 2024-01-13 NOTE — Assessment & Plan Note (Signed)
 Check CBC and iron labs.  Patient endorses frequently consuming ice, which she has done chronically.

## 2024-01-13 NOTE — Assessment & Plan Note (Signed)
 Elevated in clinic x 2 today.  Unclear if this is her baseline or if there is a whitecoat hypertension component.  Advised to monitor blood pressure at home, states she has a cuff.  If still elevated at home, consider pharmacotherapy for this problem next visit.

## 2024-01-14 LAB — IRON,TIBC AND FERRITIN PANEL
Ferritin: 4 ng/mL — ABNORMAL LOW (ref 15–150)
Iron Saturation: 5 % — CL (ref 15–55)
Iron: 27 ug/dL (ref 27–159)
Total Iron Binding Capacity: 543 ug/dL — ABNORMAL HIGH (ref 250–450)
UIBC: 516 ug/dL — ABNORMAL HIGH (ref 131–425)

## 2024-01-14 LAB — LIPID PANEL
Chol/HDL Ratio: 3.8 ratio (ref 0.0–4.4)
Cholesterol, Total: 107 mg/dL (ref 100–199)
HDL: 28 mg/dL — ABNORMAL LOW (ref >39)
LDL Chol Calc (NIH): 43 mg/dL (ref 0–99)
Triglycerides: 227 mg/dL — ABNORMAL HIGH (ref 0–149)
VLDL Cholesterol Cal: 36 mg/dL (ref 5–40)

## 2024-01-14 LAB — COMPREHENSIVE METABOLIC PANEL WITH GFR
ALT: 24 IU/L (ref 0–32)
AST: 22 IU/L (ref 0–40)
Albumin: 4.6 g/dL (ref 4.0–5.0)
Alkaline Phosphatase: 55 IU/L (ref 44–121)
BUN/Creatinine Ratio: 14 (ref 9–23)
BUN: 10 mg/dL (ref 6–20)
Bilirubin Total: 0.4 mg/dL (ref 0.0–1.2)
CO2: 20 mmol/L (ref 20–29)
Calcium: 9.5 mg/dL (ref 8.7–10.2)
Chloride: 107 mmol/L — ABNORMAL HIGH (ref 96–106)
Creatinine, Ser: 0.72 mg/dL (ref 0.57–1.00)
Globulin, Total: 2.9 g/dL (ref 1.5–4.5)
Glucose: 96 mg/dL (ref 70–99)
Potassium: 5 mmol/L (ref 3.5–5.2)
Sodium: 142 mmol/L (ref 134–144)
Total Protein: 7.5 g/dL (ref 6.0–8.5)
eGFR: 116 mL/min/{1.73_m2} (ref >59)

## 2024-01-14 LAB — CBC WITH DIFFERENTIAL/PLATELET
Basophils Absolute: 0 10*3/uL (ref 0.0–0.2)
Basos: 1 %
EOS (ABSOLUTE): 0.1 10*3/uL (ref 0.0–0.4)
Eos: 1 %
Hematocrit: 27.7 % — ABNORMAL LOW (ref 34.0–46.6)
Hemoglobin: 8 g/dL — ABNORMAL LOW (ref 11.1–15.9)
Immature Grans (Abs): 0 10*3/uL (ref 0.0–0.1)
Immature Granulocytes: 1 %
Lymphocytes Absolute: 1.4 10*3/uL (ref 0.7–3.1)
Lymphs: 25 %
MCH: 20.5 pg — ABNORMAL LOW (ref 26.6–33.0)
MCHC: 28.9 g/dL — ABNORMAL LOW (ref 31.5–35.7)
MCV: 71 fL — ABNORMAL LOW (ref 79–97)
Monocytes Absolute: 0.5 10*3/uL (ref 0.1–0.9)
Monocytes: 8 %
Neutrophils Absolute: 3.7 10*3/uL (ref 1.4–7.0)
Neutrophils: 64 %
Platelets: 273 10*3/uL (ref 150–450)
RBC: 3.9 x10E6/uL (ref 3.77–5.28)
RDW: 17.2 % — ABNORMAL HIGH (ref 11.7–15.4)
WBC: 5.8 10*3/uL (ref 3.4–10.8)

## 2024-01-14 LAB — TSH: TSH: 1.8 u[IU]/mL (ref 0.450–4.500)

## 2024-01-14 LAB — HEMOGLOBIN A1C
Est. average glucose Bld gHb Est-mCnc: 114 mg/dL
Hgb A1c MFr Bld: 5.6 % (ref 4.8–5.6)

## 2024-01-15 ENCOUNTER — Other Ambulatory Visit: Payer: Self-pay

## 2024-01-15 ENCOUNTER — Other Ambulatory Visit: Payer: Self-pay | Admitting: Physician Assistant

## 2024-01-15 DIAGNOSIS — D509 Iron deficiency anemia, unspecified: Secondary | ICD-10-CM

## 2024-01-15 MED ORDER — IRON (FERROUS SULFATE) 325 (65 FE) MG PO TABS
325.0000 mg | ORAL_TABLET | Freq: Every day | ORAL | 0 refills | Status: DC
Start: 2024-01-15 — End: 2024-02-13
  Filled 2024-01-15: qty 100, 100d supply, fill #0

## 2024-01-16 ENCOUNTER — Other Ambulatory Visit: Payer: Self-pay

## 2024-01-16 NOTE — Telephone Encounter (Signed)
 Please review.  KP

## 2024-01-17 ENCOUNTER — Other Ambulatory Visit: Payer: Self-pay

## 2024-01-19 ENCOUNTER — Other Ambulatory Visit: Payer: Self-pay | Admitting: Obstetrics and Gynecology

## 2024-01-20 ENCOUNTER — Encounter: Payer: Self-pay | Admitting: Physician Assistant

## 2024-01-20 ENCOUNTER — Telehealth: Admitting: Physician Assistant

## 2024-01-20 ENCOUNTER — Other Ambulatory Visit: Payer: Self-pay

## 2024-01-20 VITALS — Ht 64.0 in

## 2024-01-20 DIAGNOSIS — B001 Herpesviral vesicular dermatitis: Secondary | ICD-10-CM

## 2024-01-20 DIAGNOSIS — B3731 Acute candidiasis of vulva and vagina: Secondary | ICD-10-CM | POA: Diagnosis not present

## 2024-01-20 MED ORDER — FLUCONAZOLE 150 MG PO TABS
150.0000 mg | ORAL_TABLET | ORAL | 0 refills | Status: AC
Start: 2024-01-20 — End: 2024-01-23
  Filled 2024-01-20: qty 2, 3d supply, fill #0

## 2024-01-20 MED ORDER — VALACYCLOVIR HCL 1 G PO TABS
2000.0000 mg | ORAL_TABLET | Freq: Two times a day (BID) | ORAL | 1 refills | Status: AC
Start: 2024-01-20 — End: ?
  Filled 2024-01-20: qty 30, 8d supply, fill #0
  Filled 2024-08-28: qty 30, 8d supply, fill #1

## 2024-01-20 NOTE — Progress Notes (Signed)
 Date:  01/20/2024   Name:  Erika Solomon   DOB:  [birth year]   MRN:  [id number]   I connected with Erika Solomon on 01/20/2024 via MyChart Video and verified that I am speaking with the correct Erika Solomon using appropriate identifiers. The limitations, risks, security and privacy concerns of performing an evaluation and management service by MyChart Video, including the higher likelihood of inaccurate diagnoses and treatments, and the availability of in [facility] appointments were reviewed. The possible need of an additional face-to-face encounter for complete and high quality delivery of care was discussed. The patient was also made aware that there may be a patient responsible charge related to this service. The patient expressed understanding and wishes to proceed.   Provider location is in medical facility [facility]). Patient location is at their home People involved in care of the patient during this telehealth encounter were myself, my CMA, and my front office/scheduling team member.    Chief Complaint: Vaginitis (Positive at home test, thick white discharge, itching, uncomfortable, used monistat, did not help )  HPI Erika Solomon presents virtually today for treatment of suspected yeast infection reporting symptoms of vaginal itching and discomfort along with thick white discharge and a positive home test.  Says this is similar to previous yeast infections that she has had.  Thinks this was brought on by sexual activity after a period of inactivity.  Usually resolves with fluconazole .  Unrelated, she would like to report her home blood pressure readings over the last few days, generally 130s/80s with a few outliers.  She also has a cold sore on the left upper lip that is not responding to Abreva  Medication list has been reviewed and updated.  Current Meds  Medication Sig   fluconazole  (DIFLUCAN ) 150 MG tablet Take 1 tablet (150 mg total)  by mouth as directed for 3 days. Take 1 tablet by mouth.  If no improvement after 72 hours, you may repeat dose x1.   Iron , Ferrous Sulfate , 325 (65 Fe) MG TABS Take 325 mg by mouth daily.   ondansetron  (ZOFRAN -ODT) 4 MG disintegrating tablet Take 1 tablet (4 mg total) by mouth every 8 (eight) hours as needed for nausea or vomiting.   valACYclovir  (VALTREX ) 1000 MG tablet Take 2 tablets (2,000 mg total) by mouth 2 (two) times daily. Take 2 tablets every 12h for ONE day at the start of a cold sore, then 1 tablet every 12h for the following day, then stop.     Review of Systems  Patient Active Problem List   Diagnosis Date Noted   Class 3 severe obesity in adult [facility]) 01/13/2024   Primary hypertension 01/13/2024   History of gestational diabetes 01/13/2024   Iron  deficiency anemia 12/26/2016    Allergies  Allergen Reactions   Albuterol Anaphylaxis   Benadryl  [Diphenhydramine ] Anaphylaxis   Buspar [Buspirone] Anaphylaxis   Strawberry Extract Anaphylaxis   Milk-Related Compounds Nausea And Vomiting    Immunization History  Administered Date(s) Administered   Influenza,inj,Quad PF,6+ Mos 09/30/2016   MMR 01/02/2023   Moderna Covid-19 Vaccine Bivalent Booster 18yrs & up 10/09/2020   PPD Test 11/15/2021   Tdap 10/12/2022   Varicella 01/02/2023    Past Surgical History:  Procedure Laterality Date   CESAREAN SECTION N/A 12/23/2016   Procedure: CESAREAN SECTION;  Surgeon: Darl Edu, MD;  Location: [facility] ORS;  Service: Obstetrics;  Laterality: N/A;   CESAREAN SECTION N/A 12/31/2022   Procedure: REPEAT  CESAREAN DELIVERY;  Surgeon: Zenobia Hila, MD;  Location: [facility] ORS;  Service: Obstetrics;  Laterality: N/A;   WISDOM TOOTH EXTRACTION     four; 2018    Social History   Tobacco Use   Smoking status: Never   Smokeless tobacco: Never  Vaping Use   Vaping status: Never Used  Substance Use Topics   Alcohol use: No   Drug use: No    Family History  Problem Relation  Age of Onset   Diabetes Mother    Thyroid disease Mother    Epilepsy Mother    Anxiety disorder Mother    Miscarriages / Stillbirths Mother    Hypertension Father    Alcohol abuse Father    Depression Father    Sickle cell anemia Sister    Hypertension Brother    Autism spectrum disorder Brother    ADD / ADHD Brother    Asthma Brother    Intellectual disability Brother    Hypertension Maternal Grandmother    Arthritis Maternal Grandmother    Diabetes Maternal Grandfather    COPD Maternal Grandfather    Stroke Maternal Grandfather    Diabetes Paternal Grandmother    Other Paternal Grandmother        problem c kidneys and liver   Hypertension Paternal Grandfather    ADD / ADHD Son    ADD / ADHD Son    Cancer Maternal Aunt    Intellectual disability Sister         01/13/2024   10:09 AM  GAD 7 : Generalized Anxiety Score  Nervous, Anxious, on Edge 0  Control/stop worrying 0  Worry too much - different things 0  Trouble relaxing 0  Restless 0  Easily annoyed or irritable 0  Afraid - awful might happen 0  Total GAD 7 Score 0  Anxiety Difficulty Not difficult at all       01/13/2024   10:09 AM 12/08/2022   11:01 AM 11/25/2022   10:47 AM  Depression screen PHQ 2/9  Decreased Interest 0 3 0  Down, Depressed, Hopeless 0 3 0  PHQ - 2 Score 0 6 0  Altered sleeping 2 2   Tired, decreased energy 2 3   Change in appetite 2 2   Feeling bad or failure about yourself  2 3   Trouble concentrating 0 3   Moving slowly or fidgety/restless 0 0   Suicidal thoughts 0 0   PHQ-9 Score 8 19   Difficult doing work/chores Not difficult at all Somewhat difficult     BP Readings from Last 3 Encounters:  01/13/24 (!) 136/96  04/05/23 123/72  02/15/23 124/84    Wt Readings from Last 3 Encounters:  01/13/24 237 lb (107.5 kg)  04/05/23 220 lb (99.8 kg)  02/15/23 224 lb 14.4 oz (102 kg)    Ht 5\' 4"  (1.626 m)   BMI 40.68 kg/m   Physical Exam General: Speaking full sentences, no  audible heavy breathing. Sounds alert and appropriately interactive. Well-appearing. Face symmetric. Extraocular movements intact. Pupils equal and round. No nasal flaring or accessory muscle use visualized.  Recent Labs     Component Value Date/Time   NA 142 01/13/2024 1050   NA 140 06/19/2014 0450   K 5.0 01/13/2024 1050   K 4.0 06/19/2014 0450   CL 107 (H) 01/13/2024 1050   CL 109 (H) 06/19/2014 0450   CO2 20 01/13/2024 1050   CO2 20 (L) 06/19/2014 0450   GLUCOSE 96 01/13/2024 1050  GLUCOSE 113 (H) 04/05/2023 1744   GLUCOSE 86 06/19/2014 0450   BUN 10 01/13/2024 1050   BUN 7 06/19/2014 0450   CREATININE 0.72 01/13/2024 1050   CREATININE 0.65 06/19/2014 0450   CALCIUM 9.5 01/13/2024 1050   CALCIUM 8.5 (L) 06/19/2014 0450   PROT 7.5 01/13/2024 1050   PROT 6.7 06/19/2014 0450   ALBUMIN 4.6 01/13/2024 1050   ALBUMIN 2.7 (L) 06/19/2014 0450   AST 22 01/13/2024 1050   AST 27 (H) 06/19/2014 0450   ALT 24 01/13/2024 1050   ALT 15 06/19/2014 0450   ALKPHOS 55 01/13/2024 1050   ALKPHOS 127 (H) 06/19/2014 0450   BILITOT 0.4 01/13/2024 1050   BILITOT 0.2 06/19/2014 0450   GFRNONAA >60 04/05/2023 1744   GFRNONAA >60 06/19/2014 0450   GFRAA 142 07/11/2019 1425   GFRAA >60 06/19/2014 0450    Lab Results  Component Value Date   WBC 5.8 01/13/2024   HGB 8.0 (L) 01/13/2024   HCT 27.7 (L) 01/13/2024   MCV 71 (L) 01/13/2024   PLT 273 01/13/2024   Lab Results  Component Value Date   HGBA1C 5.6 01/13/2024   Lab Results  Component Value Date   CHOL 107 01/13/2024   HDL 28 (L) 01/13/2024   LDLCALC 43 01/13/2024   TRIG 227 (H) 01/13/2024   CHOLHDL 3.8 01/13/2024   Lab Results  Component Value Date   TSH 1.800 01/13/2024     Assessment and Plan:  1. Vaginal candidiasis (Primary) Treat with Diflucan  as below. - fluconazole  (DIFLUCAN ) 150 MG tablet; Take 1 tablet (150 mg total) by mouth as directed for 3 days. Take 1 tablet by mouth.  If no improvement after 72 hours, you  may repeat dose x1.  Dispense: 2 tablet; Refill: 0  2. Cold sore Will send Valtrex  for patient to use as directed over the next 48 hours.  Dispense extra to have on-hand in the event of recurrence. - valACYclovir  (VALTREX ) 1000 MG tablet; Take 2 tablets (2,000 mg total) by mouth 2 (two) times daily. Take 2 tablets every 12h for ONE day at the start of a cold sore, then 1 tablet every 12h for the following day, then stop.  Dispense: 30 tablet; Refill: 1    F/u as scheduled 02/13/24  I discussed the above assessment and treatment plan with the patient. The patient was provided an opportunity to ask questions and all were answered. The patient agreed with the plan and demonstrated an understanding of the instructions. The patient was advised to call back or seek an in-[facility] evaluation if the symptoms worsen or if the condition fails to improve as anticipated. I provided a total time of 20 minutes inclusive of time utilized for medical chart review, information gathering, care coordination with staff, and documentation completion.  Cody Das, PA-C, DMSc, Nutritionist [facility] [phone]

## 2024-01-20 NOTE — Telephone Encounter (Signed)
 See other message

## 2024-02-13 ENCOUNTER — Encounter: Payer: Self-pay | Admitting: Physician Assistant

## 2024-02-13 ENCOUNTER — Other Ambulatory Visit: Payer: Self-pay | Admitting: Physician Assistant

## 2024-02-13 ENCOUNTER — Telehealth: Payer: Self-pay | Admitting: Physician Assistant

## 2024-02-13 ENCOUNTER — Ambulatory Visit: Admitting: Physician Assistant

## 2024-02-13 ENCOUNTER — Other Ambulatory Visit: Payer: Self-pay

## 2024-02-13 VITALS — BP 132/70 | Ht 64.0 in | Wt 234.0 lb

## 2024-02-13 DIAGNOSIS — E8881 Metabolic syndrome: Secondary | ICD-10-CM | POA: Diagnosis not present

## 2024-02-13 DIAGNOSIS — J3489 Other specified disorders of nose and nasal sinuses: Secondary | ICD-10-CM

## 2024-02-13 DIAGNOSIS — E66813 Obesity, class 3: Secondary | ICD-10-CM

## 2024-02-13 DIAGNOSIS — Z6841 Body Mass Index (BMI) 40.0 and over, adult: Secondary | ICD-10-CM

## 2024-02-13 DIAGNOSIS — D509 Iron deficiency anemia, unspecified: Secondary | ICD-10-CM | POA: Diagnosis not present

## 2024-02-13 MED ORDER — WEGOVY 0.25 MG/0.5ML ~~LOC~~ SOAJ
0.2500 mg | SUBCUTANEOUS | 0 refills | Status: DC
Start: 2024-02-13 — End: 2024-02-23
  Filled 2024-02-13 – 2024-02-15 (×2): qty 2, 28d supply, fill #0

## 2024-02-13 NOTE — Progress Notes (Signed)
Date:  02/13/2024   Name:  Erika Solomon   DOB:  [birth year]   MRN:  [id number]   Chief Complaint: Hypertension (f/u weight, HTN - recent BP readings at home 134/80, 137/80, 139/79)  Hypertension   Erika Solomon returns to clinic for 1 month follow-up on chronic conditions including HTN, obesity, and iron  deficiency anemia.  Hemoglobin was 8.0 and hematocrit 27.7 on Feb 10, 2024 reflecting a pronounced microcytic iron  deficiency anemia.  Fortunately, she was able to purchase iron  bisglycinate and has been tolerating it well. She states that she has an irregular menstrual cycle, sometimes with heavy bleeding.   Separately, she was seen for virtual visit in the interim for yeast infection and cold sore which have both resolved. However, she also reports an intranasal sore on the right which has not improved with the valacyclovir  or with topical antibiotic.   Strong family history of obesity, patient has gradually gained weight over time, particularly difficult to lose following her most recent pregnancy which was her fourth child (now 28-year-old).  Over the last several months she has been working on dietary changes to include reduction of processed sugars through beverages and sweets.  She typically eats 2 meals per day.  She has a treadmill at home along with a set of hand weights, exercises some but no dedicated routine.  Moving throughout the day taking care of her 4 children.  She has never used pharmacotherapy for weight loss, but would be willing to consider this.  She is not currently breast-feeding. States she really does not want to be disappointed, feels like if she were able to see some of the weight come off, this would motivate her to continue her efforts moving forward. She has no issues with self-administered injections, as she had to take insulin  for gestational diabetes.  Labs last visit mostly normal aside from HTG, low HDL, and the aforementioned iron  deficiency anemia.   Medication list  has been reviewed and updated.  Current Meds  Medication Sig   valACYclovir  (VALTREX ) 1000 MG tablet Take 2 tablets (2,000 mg total) by mouth 2 (two) times daily. Take 2 tablets every 12h for ONE day at the start of a cold sore, then 1 tablet every 12h for the following day, then stop.   WEGOVY 0.25 MG/0.5ML SOAJ Inject 0.25 mg into the skin once a week.     Review of Systems  Patient Active Problem List   Diagnosis Date Noted   Metabolic syndrome 02/13/2024   Class 3 severe obesity in adult 2024-02-10   Primary hypertension 02-10-24   History of gestational diabetes 02/10/2024   Iron  deficiency anemia 12/26/2016    Allergies  Allergen Reactions   Albuterol Anaphylaxis   Benadryl  [Diphenhydramine ] Anaphylaxis   Buspar [Buspirone] Anaphylaxis   Strawberry Extract Anaphylaxis    Immunization History  Administered Date(s) Administered   Influenza,inj,Quad PF,6+ Mos 09/30/2016   MMR 01/02/2023   Moderna Covid-19 Vaccine Bivalent Booster 48yrs & up 10/09/2020   PPD Test 11/15/2021   Tdap 10/12/2022   Varicella 01/02/2023    Past Surgical History:  Procedure Laterality Date   CESAREAN SECTION N/A 12/23/2016   Procedure: CESAREAN SECTION;  Surgeon: Darl Edu, MD;  Location: [facility] ORS;  Service: Obstetrics;  Laterality: N/A;   CESAREAN SECTION N/A 12/31/2022   Procedure: REPEAT CESAREAN DELIVERY;  Surgeon: Zenobia Hila, MD;  Location: [facility] ORS;  Service: Obstetrics;  Laterality: N/A;   WISDOM TOOTH EXTRACTION     four; 2018  Social History   Tobacco Use   Smoking status: Never   Smokeless tobacco: Never  Vaping Use   Vaping status: Never Used  Substance Use Topics   Alcohol use: No   Drug use: No    Family History  Problem Relation Age of Onset   Diabetes Mother    Thyroid disease Mother    Epilepsy Mother    Anxiety disorder Mother    Miscarriages / Stillbirths Mother    Hypertension Father    Alcohol abuse Father    Depression Father     Sickle cell anemia Sister    Hypertension Brother    Autism spectrum disorder Brother    ADD / ADHD Brother    Asthma Brother    Intellectual disability Brother    Hypertension Maternal Grandmother    Arthritis Maternal Grandmother    Diabetes Maternal Grandfather    COPD Maternal Grandfather    Stroke Maternal Grandfather    Diabetes Paternal Grandmother    Other Paternal Grandmother        problem c kidneys and liver   Hypertension Paternal Grandfather    ADD / ADHD Son    ADD / ADHD Son    Cancer Maternal Aunt    Intellectual disability Sister         02/13/2024    9:20 AM 01/13/2024   10:09 AM  GAD 7 : Generalized Anxiety Score  Nervous, Anxious, on Edge 0 0  Control/stop worrying 0 0  Worry too much - different things 0 0  Trouble relaxing 0 0  Restless 0 0  Easily annoyed or irritable 0 0  Afraid - awful might happen 0 0  Total GAD 7 Score 0 0  Anxiety Difficulty Not difficult at all Not difficult at all       02/13/2024    9:20 AM 01/13/2024   10:09 AM 12/08/2022   11:01 AM  Depression screen PHQ 2/9  Decreased Interest 0 0 3  Down, Depressed, Hopeless 0 0 3  PHQ - 2 Score 0 0 6  Altered sleeping 2 2 2   Tired, decreased energy 2 2 3   Change in appetite 2 2 2   Feeling bad or failure about yourself  2 2 3   Trouble concentrating 0 0 3  Moving slowly or fidgety/restless 0 0 0  Suicidal thoughts 0 0 0  PHQ-9 Score 8 8 19   Difficult doing work/chores Not difficult at all Not difficult at all Somewhat difficult    BP Readings from Last 3 Encounters:  02/13/24 132/70  01/13/24 (!) 136/96  04/05/23 123/72    Wt Readings from Last 3 Encounters:  02/13/24 234 lb (106.1 kg)  01/13/24 237 lb (107.5 kg)  04/05/23 220 lb (99.8 kg)    BP 132/70 (BP Location: Left Arm, Cuff Size: Large)   Ht 5\' 4"  (1.626 m)   Wt 234 lb (106.1 kg)   LMP 01/23/2024 (Exact Date)   Breastfeeding No   BMI 40.17 kg/m   Physical Exam Vitals and nursing note reviewed.   Constitutional:      Appearance: Normal appearance. She is obese.  HENT:     Nose:     Comments: There is a cracked erythematous lesion along the inferoposterior aspect of the right nasal cavity with subtle crusting.  Cardiovascular:     Rate and Rhythm: Normal rate.  Pulmonary:     Effort: Pulmonary effort is normal.  Abdominal:     General: There is no distension.  Musculoskeletal:  General: Normal range of motion.  Skin:    General: Skin is warm and dry.  Neurological:     Mental Status: She is alert and oriented to person, place, and time.     Gait: Gait is intact.  Psychiatric:        Mood and Affect: Mood and affect normal.     Recent Labs     Component Value Date/Time   NA 142 01/13/2024 1050   NA 140 06/19/2014 0450   K 5.0 01/13/2024 1050   K 4.0 06/19/2014 0450   CL 107 (H) 01/13/2024 1050   CL 109 (H) 06/19/2014 0450   CO2 20 01/13/2024 1050   CO2 20 (L) 06/19/2014 0450   GLUCOSE 96 01/13/2024 1050   GLUCOSE 113 (H) 04/05/2023 1744   GLUCOSE 86 06/19/2014 0450   BUN 10 01/13/2024 1050   BUN 7 06/19/2014 0450   CREATININE 0.72 01/13/2024 1050   CREATININE 0.65 06/19/2014 0450   CALCIUM 9.5 01/13/2024 1050   CALCIUM 8.5 (L) 06/19/2014 0450   PROT 7.5 01/13/2024 1050   PROT 6.7 06/19/2014 0450   ALBUMIN 4.6 01/13/2024 1050   ALBUMIN 2.7 (L) 06/19/2014 0450   AST 22 01/13/2024 1050   AST 27 (H) 06/19/2014 0450   ALT 24 01/13/2024 1050   ALT 15 06/19/2014 0450   ALKPHOS 55 01/13/2024 1050   ALKPHOS 127 (H) 06/19/2014 0450   BILITOT 0.4 01/13/2024 1050   BILITOT 0.2 06/19/2014 0450   GFRNONAA >60 04/05/2023 1744   GFRNONAA >60 06/19/2014 0450   GFRAA 142 07/11/2019 1425   GFRAA >60 06/19/2014 0450    Lab Results  Component Value Date   WBC 5.8 01/13/2024   HGB 8.0 (L) 01/13/2024   HCT 27.7 (L) 01/13/2024   MCV 71 (L) 01/13/2024   PLT 273 01/13/2024   Lab Results  Component Value Date   HGBA1C 5.6 01/13/2024   Lab Results  Component  Value Date   CHOL 107 01/13/2024   HDL 28 (L) 01/13/2024   LDLCALC 43 01/13/2024   TRIG 227 (H) 01/13/2024   CHOLHDL 3.8 01/13/2024   Lab Results  Component Value Date   TSH 1.800 01/13/2024     Assessment and Plan:  1. Iron  deficiency anemia, unspecified iron  deficiency anemia type (Primary) Repeat CBC. Continue iron  bisglycinate.  - CBC with Differential/Platelet  2. Class 3 severe obesity with serious comorbidity and body mass index (BMI) of 40.0 to 44.9 in adult Will attempt prior auth for [facility]. Demo'd to patient today.   Advised the importance of adequate protein intake on this medication as well as resistance training which will not only improve insulin  sensitivity but also build muscle mass and reduce risk of atrophy.  Advised importance of portion control and slower eating on this medication to minimize GI side effects. Avoid high fat foods as these may cause discomfort. Patient aware of potential for weight regain when eventually stopping the medication. For this reason, continued efforts toward improving lifestyle will be particularly important moving forward.   - WEGOVY 0.25 MG/0.5ML SOAJ; Inject 0.25 mg into the skin once a week.  Dispense: 2 mL; Refill: 0  3. Metabolic syndrome Plan as above  4. Lesion of right nasal cavity Advised to coat with Vaseline or Aquaphor for the next week or so. Follow clinically.     F/u TBD pending HQIONG Morna Arabian, PA-C, DMSc, Nutritionist [facility] Primary [facility] and Sports Medicine [facility] [phone]  [phone]   

## 2024-02-14 ENCOUNTER — Other Ambulatory Visit (HOSPITAL_COMMUNITY): Payer: Self-pay

## 2024-02-14 ENCOUNTER — Telehealth: Payer: Self-pay | Admitting: Pharmacy Technician

## 2024-02-14 ENCOUNTER — Ambulatory Visit: Payer: Self-pay | Admitting: Physician Assistant

## 2024-02-14 LAB — CBC WITH DIFFERENTIAL/PLATELET
Basophils Absolute: 0 10*3/uL (ref 0.0–0.2)
Basos: 1 %
EOS (ABSOLUTE): 0.1 10*3/uL (ref 0.0–0.4)
Eos: 1 %
Hematocrit: 28.4 % — ABNORMAL LOW (ref 34.0–46.6)
Hemoglobin: 7.9 g/dL — ABNORMAL LOW (ref 11.1–15.9)
Immature Grans (Abs): 0 10*3/uL (ref 0.0–0.1)
Immature Granulocytes: 1 %
Lymphocytes Absolute: 1.8 10*3/uL (ref 0.7–3.1)
Lymphs: 26 %
MCH: 19.6 pg — ABNORMAL LOW (ref 26.6–33.0)
MCHC: 27.8 g/dL — ABNORMAL LOW (ref 31.5–35.7)
MCV: 70 fL — ABNORMAL LOW (ref 79–97)
Monocytes Absolute: 0.5 10*3/uL (ref 0.1–0.9)
Monocytes: 7 %
Neutrophils Absolute: 4.6 10*3/uL (ref 1.4–7.0)
Neutrophils: 64 %
Platelets: 287 10*3/uL (ref 150–450)
RBC: 4.04 x10E6/uL (ref 3.77–5.28)
RDW: 17.8 % — ABNORMAL HIGH (ref 11.7–15.4)
WBC: 7.1 10*3/uL (ref 3.4–10.8)

## 2024-02-14 NOTE — Telephone Encounter (Signed)
 Pharmacy Patient Advocate Encounter   Received notification from Pt Calls Messages that prior authorization for Wegovy 0.25MG /0.5ML auto-injectors is required/requested.   Insurance verification completed.   The patient is insured through [facility] .   Per test claim: PA required; PA submitted to above mentioned insurance via CoverMyMeds Key/confirmation #/[id number] Status is pending

## 2024-02-14 NOTE — Telephone Encounter (Signed)
 Pharmacy Patient Advocate Encounter  Received notification from [facility] that Prior Authorization for [facility] 0.25MG /0.5ML auto-injectors has been APPROVED from 02/14/24 to 08/12/24. Ran test claim, Copay is $4.00. This test claim was processed through [facility]- copay amounts may vary at other pharmacies due to pharmacy/plan contracts, or as the patient moves through the different stages of their insurance plan.   PA #/Case ID/Reference #: [id number]

## 2024-02-14 NOTE — Telephone Encounter (Signed)
 Key: [id number]

## 2024-02-14 NOTE — Telephone Encounter (Signed)
 Good morning Erika Solomon, would you happen to have the KEY# for this PA for Erika Solomon? I attempted to create and submit a PA and got this message from [facility]:

## 2024-02-14 NOTE — Telephone Encounter (Signed)
 For review.  KP

## 2024-02-14 NOTE — Telephone Encounter (Signed)
 Thanks

## 2024-02-14 NOTE — Telephone Encounter (Signed)
 Please complete PA for this pt.  KP

## 2024-02-15 ENCOUNTER — Other Ambulatory Visit: Payer: Self-pay

## 2024-02-16 ENCOUNTER — Other Ambulatory Visit: Payer: Self-pay

## 2024-02-23 ENCOUNTER — Ambulatory Visit: Payer: Medicaid Other | Admitting: Nurse Practitioner

## 2024-02-23 ENCOUNTER — Other Ambulatory Visit: Payer: Self-pay

## 2024-02-23 ENCOUNTER — Other Ambulatory Visit: Payer: Self-pay | Admitting: Physician Assistant

## 2024-02-23 DIAGNOSIS — Z6841 Body Mass Index (BMI) 40.0 and over, adult: Secondary | ICD-10-CM

## 2024-02-23 MED ORDER — WEGOVY 0.25 MG/0.5ML ~~LOC~~ SOAJ
0.2500 mg | SUBCUTANEOUS | 0 refills | Status: DC
Start: 2024-02-23 — End: 2024-03-15
  Filled 2024-02-23 – 2024-02-28 (×2): qty 2, 28d supply, fill #0

## 2024-02-23 NOTE — Progress Notes (Unsigned)
 Per pharmacy: "Hi! Erika Solomon reached out to us  stating she had a malfunction with her 2nd wegovy  shot. It does not seem like she received any medication. Would you advise her to retake the shot again today? Also, the company will replace her box with a voucher, can we get a new script for wegovy  for the replacement box? "   New script sent. Spoke with patient, sounds like no medication made it in, so advised to do another shot today. Worst case scenario, she still gets less than a 0.5 mg dose so really no cause for concern there. Told her about the voucher.

## 2024-02-28 ENCOUNTER — Other Ambulatory Visit: Payer: Self-pay

## 2024-03-05 ENCOUNTER — Other Ambulatory Visit: Payer: Self-pay

## 2024-03-15 ENCOUNTER — Other Ambulatory Visit: Payer: Self-pay

## 2024-03-15 ENCOUNTER — Ambulatory Visit: Admitting: Physician Assistant

## 2024-03-15 VITALS — BP 128/78 | HR 94 | Temp 97.9°F | Ht 64.0 in | Wt 222.0 lb

## 2024-03-15 DIAGNOSIS — E66812 Obesity, class 2: Secondary | ICD-10-CM | POA: Diagnosis not present

## 2024-03-15 DIAGNOSIS — D509 Iron deficiency anemia, unspecified: Secondary | ICD-10-CM | POA: Diagnosis not present

## 2024-03-15 DIAGNOSIS — E661 Drug-induced obesity: Secondary | ICD-10-CM | POA: Diagnosis not present

## 2024-03-15 DIAGNOSIS — Z6838 Body mass index (BMI) 38.0-38.9, adult: Secondary | ICD-10-CM

## 2024-03-15 MED ORDER — WEGOVY 0.5 MG/0.5ML ~~LOC~~ SOAJ
0.5000 mg | SUBCUTANEOUS | 0 refills | Status: DC
Start: 2024-03-15 — End: 2024-04-13
  Filled 2024-03-15: qty 2, 28d supply, fill #0

## 2024-03-15 NOTE — Assessment & Plan Note (Signed)
 Symptomatic.  Recheck CBC today along with iron  panel.  Continue iron  bisglycinate at the current dose for now.

## 2024-03-15 NOTE — Assessment & Plan Note (Signed)
 Congratulated on substantial weight loss following initiation of Wegovy .  Increasing Wegovy  to 0.5 mg/week.  Advised the importance of adequate protein intake on this medication as well as resistance training which will not only improve insulin  sensitivity but also build muscle mass and reduce risk of atrophy.  Advised importance of portion control and slower eating on this medication to minimize GI side effects. Avoid high fat foods as these may cause discomfort. Patient aware of potential for weight regain when eventually stopping the medication. For this reason, continued efforts toward improving lifestyle will be particularly important moving forward.

## 2024-03-15 NOTE — Progress Notes (Signed)
 Date:  03/15/2024   Name:  Erika Solomon   DOB:  05/25/1996   MRN:  [id number]   Chief Complaint: Weight Check (Down 12 lbs /)  HPI [name] presents today for 1 month follow-up on weight management after initiating Wegovy  last visit, resulting in a weight loss of 12 pounds over the last month, and she is happy about this.  Also following up on moderate to severe iron  deficiency anemia, for which she was advised to increase her dose of iron  bisglycinate to 2 capsules daily after our last visit.  She reports good medication compliance and is taking 1 capsule in the morning and 1 capsule in the evening, well-tolerated.  Separately, the nasal lesion mentioned at her last visit has improved with the use of Aquaphor, and has nearly resolved.  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   Medication list has been reviewed and updated.  Current Meds  Medication Sig   OVER THE COUNTER MEDICATION Iron  Bisglycinate   valACYclovir  (VALTREX ) 1000 MG tablet Take 2 tablets (2,000 mg total) by mouth 2 (two) times daily. Take 2 tablets every 12h for ONE day at the start of a cold sore, then 1 tablet every 12h for the following day, then stop.   WEGOVY  0.5 MG/0.5ML SOAJ Inject 0.5 mg into the skin once a week.   [DISCONTINUED] WEGOVY  0.25 MG/0.5ML SOAJ Inject 0.25 mg into the skin once a week.     Review of Systems  Patient Active Problem List   Diagnosis Date Noted   Metabolic syndrome 02/13/2024   Class 2 severe obesity with serious comorbidity in adult [facility]) 01/13/2024   Primary hypertension 01/13/2024   History of gestational diabetes 01/13/2024   Iron  deficiency anemia 12/26/2016    Allergies  Allergen Reactions   Albuterol Anaphylaxis   Benadryl  [Diphenhydramine ] Anaphylaxis   Buspar [Buspirone] Anaphylaxis   Strawberry Extract Anaphylaxis    Immunization History  Administered Date(s) Administered    Influenza,inj,Quad PF,6+ Mos 09/30/2016   MMR 01/02/2023   Moderna Covid-19 Vaccine Bivalent Booster 12yrs & up 10/09/2020   PPD Test 11/15/2021   Tdap 10/12/2022   Varicella 01/02/2023    Past Surgical History:  Procedure Laterality Date   CESAREAN SECTION N/A 12/23/2016   Procedure: CESAREAN SECTION;  Surgeon: Darl Edu, MD;  Location: [facility] ORS;  Service: Obstetrics;  Laterality: N/A;   CESAREAN SECTION N/A 12/31/2022   Procedure: REPEAT CESAREAN DELIVERY;  Surgeon: Zenobia Hila, MD;  Location: [facility] ORS;  Service: Obstetrics;  Laterality: N/A;   WISDOM TOOTH EXTRACTION     four; 2018    Social History   Tobacco Use   Smoking status: Never   Smokeless tobacco: Never  Vaping Use   Vaping status: Never Used  Substance Use Topics   Alcohol use: No   Drug use: No    Family History  Problem Relation Age of Onset   Diabetes Mother    Thyroid disease Mother    Epilepsy Mother    Anxiety disorder Mother    Miscarriages / [place] Mother    Hypertension Father    Alcohol abuse Father    Depression Father    Sickle cell anemia Sister    Hypertension Brother    Autism spectrum disorder Brother    ADD / ADHD Brother    Asthma Brother    Intellectual disability Brother    Hypertension Maternal Grandmother  Arthritis Maternal Grandmother    Diabetes Maternal Grandfather    COPD Maternal Grandfather    Stroke Maternal Grandfather    Diabetes Paternal Grandmother    Other Paternal Grandmother        problem c kidneys and liver   Hypertension Paternal Grandfather    ADD / ADHD Son    ADD / ADHD Son    Cancer Maternal Aunt    Intellectual disability Sister         03/15/2024   10:48 AM 02/13/2024    9:20 AM 01/13/2024   10:09 AM  GAD 7 : Generalized Anxiety Score  Nervous, Anxious, on Edge 0 0 0  Control/stop worrying 0 0 0  Worry too much - different things 0 0 0  Trouble relaxing 0 0 0  Restless 0 0 0  Easily annoyed or irritable 0 0 0   Afraid - awful might happen  0 0  Total GAD 7 Score  0 0  Anxiety Difficulty Not difficult at all Not difficult at all Not difficult at all       03/15/2024   10:48 AM 02/13/2024    9:20 AM 01/13/2024   10:09 AM  Depression screen PHQ 2/9  Decreased Interest 0 0 0  Down, Depressed, Hopeless 0 0 0  PHQ - 2 Score 0 0 0  Altered sleeping 1 2 2   Tired, decreased energy 0 2 2  Change in appetite 0 2 2  Feeling bad or failure about yourself  0 2 2  Trouble concentrating 0 0 0  Moving slowly or fidgety/restless 0 0 0  Suicidal thoughts 0 0 0  PHQ-9 Score 1 8 8   Difficult doing work/chores Not difficult at all Not difficult at all Not difficult at all    BP Readings from Last 3 Encounters:  03/15/24 128/78  02/13/24 132/70  01/13/24 (!) 136/96    Wt Readings from Last 3 Encounters:  03/15/24 222 lb (100.7 kg)  02/13/24 234 lb (106.1 kg)  01/13/24 237 lb (107.5 kg)    BP 128/78   Pulse 94   Temp 97.9 F (36.6 C)   Ht 5' 4 (1.626 m)   Wt 222 lb (100.7 kg)   SpO2 99%   Breastfeeding No   BMI 38.11 kg/m   Physical Exam Vitals and nursing note reviewed.  Constitutional:      Appearance: Normal appearance.   Cardiovascular:     Rate and Rhythm: Normal rate.  Pulmonary:     Effort: Pulmonary effort is normal.  Abdominal:     General: There is no distension.   Musculoskeletal:        General: Normal range of motion.   Skin:    General: Skin is warm and dry.   Neurological:     Mental Status: She is alert and oriented to person, place, and time.     Gait: Gait is intact.   Psychiatric:        Mood and Affect: Mood and affect normal.     Recent Labs     Component Value Date/Time   NA 142 01/13/2024 1050   NA 140 06/19/2014 0450   K 5.0 01/13/2024 1050   K 4.0 06/19/2014 0450   CL 107 (H) 01/13/2024 1050   CL 109 (H) 06/19/2014 0450   CO2 20 01/13/2024 1050   CO2 20 (L) 06/19/2014 0450   GLUCOSE 96 01/13/2024 1050   GLUCOSE 113 (H) 04/05/2023 1744    GLUCOSE 86 06/19/2014  0450   BUN 10 01/13/2024 1050   BUN 7 06/19/2014 0450   CREATININE 0.72 01/13/2024 1050   CREATININE 0.65 06/19/2014 0450   CALCIUM 9.5 01/13/2024 1050   CALCIUM 8.5 (L) 06/19/2014 0450   PROT 7.5 01/13/2024 1050   PROT 6.7 06/19/2014 0450   ALBUMIN 4.6 01/13/2024 1050   ALBUMIN 2.7 (L) 06/19/2014 0450   AST 22 01/13/2024 1050   AST 27 (H) 06/19/2014 0450   ALT 24 01/13/2024 1050   ALT 15 06/19/2014 0450   ALKPHOS 55 01/13/2024 1050   ALKPHOS 127 (H) 06/19/2014 0450   BILITOT 0.4 01/13/2024 1050   BILITOT 0.2 06/19/2014 0450   GFRNONAA >60 04/05/2023 1744   GFRNONAA >60 06/19/2014 0450   GFRAA 142 07/11/2019 1425   GFRAA >60 06/19/2014 0450    Lab Results  Component Value Date   WBC 7.1 02/13/2024   HGB 7.9 (L) 02/13/2024   HCT 28.4 (L) 02/13/2024   MCV 70 (L) 02/13/2024   PLT 287 02/13/2024   Lab Results  Component Value Date   HGBA1C 5.6 01/13/2024   Lab Results  Component Value Date   CHOL 107 01/13/2024   HDL 28 (L) 01/13/2024   LDLCALC 43 01/13/2024   TRIG 227 (H) 01/13/2024   CHOLHDL 3.8 01/13/2024   Lab Results  Component Value Date   TSH 1.800 01/13/2024     Assessment and Plan:  Class 2 drug-induced obesity with serious comorbidity and body mass index (BMI) of 38.0 to 38.9 in adult Assessment & Plan: Congratulated on substantial weight loss following initiation of Wegovy .  Increasing Wegovy  to 0.5 mg/week.  Advised the importance of adequate protein intake on this medication as well as resistance training which will not only improve insulin  sensitivity but also build muscle mass and reduce risk of atrophy.  Advised importance of portion control and slower eating on this medication to minimize GI side effects. Avoid high fat foods as these may cause discomfort. Patient aware of potential for weight regain when eventually stopping the medication. For this reason, continued efforts toward improving lifestyle will be particularly  important moving forward.    Orders: -     Wegovy ; Inject 0.5 mg into the skin once a week.  Dispense: 2 mL; Refill: 0  Iron  deficiency anemia, unspecified iron  deficiency anemia type Assessment & Plan: Symptomatic.  Recheck CBC today along with iron  panel.  Continue iron  bisglycinate at the current dose for now.  Orders: -     CBC with Differential/Platelet -     Iron , TIBC and Ferritin Panel     Return in about 4 weeks (around 04/12/2024) for OV f/u Wegovy .    Cody Das, PA-C, DMSc, Nutritionist [facility] Primary Care and Sports Medicine [facility] [phone]

## 2024-03-17 LAB — CBC WITH DIFFERENTIAL/PLATELET
Basophils Absolute: 0.1 10*3/uL (ref 0.0–0.2)
Basos: 1 %
EOS (ABSOLUTE): 0.1 10*3/uL (ref 0.0–0.4)
Eos: 1 %
Hematocrit: 29.6 % — ABNORMAL LOW (ref 34.0–46.6)
Hemoglobin: 8.1 g/dL — ABNORMAL LOW (ref 11.1–15.9)
Immature Grans (Abs): 0 10*3/uL (ref 0.0–0.1)
Immature Granulocytes: 0 %
Lymphocytes Absolute: 1.6 10*3/uL (ref 0.7–3.1)
Lymphs: 25 %
MCH: 19.8 pg — ABNORMAL LOW (ref 26.6–33.0)
MCHC: 27.4 g/dL — ABNORMAL LOW (ref 31.5–35.7)
MCV: 72 fL — ABNORMAL LOW (ref 79–97)
Monocytes Absolute: 0.4 10*3/uL (ref 0.1–0.9)
Monocytes: 5 %
Neutrophils Absolute: 4.3 10*3/uL (ref 1.4–7.0)
Neutrophils: 68 %
Platelets: 292 10*3/uL (ref 150–450)
RBC: 4.09 x10E6/uL (ref 3.77–5.28)
RDW: 18.3 % — ABNORMAL HIGH (ref 11.7–15.4)
WBC: 6.5 10*3/uL (ref 3.4–10.8)

## 2024-03-17 LAB — IRON,TIBC AND FERRITIN PANEL
Ferritin: 4 ng/mL — ABNORMAL LOW (ref 15–150)
Iron Saturation: 4 % — CL (ref 15–55)
Iron: 23 ug/dL — ABNORMAL LOW (ref 27–159)
Total Iron Binding Capacity: 535 ug/dL — ABNORMAL HIGH (ref 250–450)
UIBC: 512 ug/dL — ABNORMAL HIGH (ref 131–425)

## 2024-03-19 ENCOUNTER — Ambulatory Visit: Payer: Self-pay | Admitting: Physician Assistant

## 2024-03-19 ENCOUNTER — Other Ambulatory Visit: Payer: Self-pay | Admitting: Physician Assistant

## 2024-03-19 DIAGNOSIS — D509 Iron deficiency anemia, unspecified: Secondary | ICD-10-CM

## 2024-03-19 NOTE — Telephone Encounter (Signed)
 Please review and advise patient.   JM

## 2024-03-20 ENCOUNTER — Other Ambulatory Visit (HOSPITAL_COMMUNITY): Payer: Self-pay | Admitting: Physician Assistant

## 2024-03-20 ENCOUNTER — Telehealth (HOSPITAL_COMMUNITY): Payer: Self-pay | Admitting: Physician Assistant

## 2024-03-20 DIAGNOSIS — D509 Iron deficiency anemia, unspecified: Secondary | ICD-10-CM

## 2024-03-20 NOTE — Telephone Encounter (Signed)
 Patient referred to infusion pharmacy team for ambulatory infusion of IV iron .  Insurance - [place] Medicaid Prepaid Site of care - Site of care: [facility] INF Dx code - D50.9 IV Iron  Therapy - Feraheme 510 mg IV x 2  Infusion appointments - Scheduling team will schedule patient as soon as possible.    Sonjia Wilcoxson D. Catalea Labrecque, PharmD

## 2024-03-23 ENCOUNTER — Ambulatory Visit
Admission: RE | Admit: 2024-03-23 | Discharge: 2024-03-23 | Disposition: A | Discharge status: Home / Self Care | Source: Ambulatory Visit | Attending: Physician Assistant | Admitting: Physician Assistant

## 2024-03-23 DIAGNOSIS — D509 Iron deficiency anemia, unspecified: Secondary | ICD-10-CM | POA: Diagnosis present

## 2024-03-23 MED ORDER — SODIUM CHLORIDE 0.9 % IV SOLN
510.0000 mg | INTRAVENOUS | Status: DC
  Administered 2024-03-23: 510 mg via INTRAVENOUS
  Filled 2024-03-23: qty 17

## 2024-03-27 ENCOUNTER — Encounter

## 2024-03-30 ENCOUNTER — Other Ambulatory Visit: Payer: Self-pay

## 2024-03-30 ENCOUNTER — Inpatient Hospital Stay: Admission: RE | Admit: 2024-03-30 | Source: Ambulatory Visit

## 2024-04-02 ENCOUNTER — Ambulatory Visit
Admission: RE | Admit: 2024-04-02 | Discharge: 2024-04-02 | Disposition: A | Discharge status: Home / Self Care | Source: Ambulatory Visit | Attending: Physician Assistant | Admitting: Physician Assistant

## 2024-04-02 DIAGNOSIS — D509 Iron deficiency anemia, unspecified: Secondary | ICD-10-CM | POA: Diagnosis present

## 2024-04-02 MED ORDER — SODIUM CHLORIDE 0.9 % IV SOLN
510.0000 mg | Freq: Once | INTRAVENOUS | Status: AC
  Administered 2024-04-02: 510 mg via INTRAVENOUS
  Filled 2024-04-02: qty 510

## 2024-04-12 ENCOUNTER — Ambulatory Visit: Admitting: Physician Assistant

## 2024-04-13 ENCOUNTER — Encounter: Payer: Self-pay | Admitting: Physician Assistant

## 2024-04-13 ENCOUNTER — Ambulatory Visit: Admitting: Physician Assistant

## 2024-04-13 ENCOUNTER — Other Ambulatory Visit: Payer: Self-pay

## 2024-04-13 VITALS — BP 114/80 | HR 81 | Temp 98.4°F | Ht 64.0 in | Wt 218.2 lb

## 2024-04-13 DIAGNOSIS — E66812 Obesity, class 2: Secondary | ICD-10-CM

## 2024-04-13 DIAGNOSIS — Z6837 Body mass index (BMI) 37.0-37.9, adult: Secondary | ICD-10-CM | POA: Diagnosis not present

## 2024-04-13 DIAGNOSIS — D509 Iron deficiency anemia, unspecified: Secondary | ICD-10-CM | POA: Diagnosis not present

## 2024-04-13 MED ORDER — WEGOVY 0.5 MG/0.5ML ~~LOC~~ SOAJ
0.5000 mg | SUBCUTANEOUS | 0 refills | Status: DC
Start: 2024-04-13 — End: 2024-05-10
  Filled 2024-04-13: qty 2, 28d supply, fill #0

## 2024-04-13 NOTE — Progress Notes (Signed)
 Date:  04/13/2024   Name:  Erika Solomon   DOB:  05/01/1996   MRN:  [id number]   Chief Complaint: Weight Check (Patient following up on her weightloss journey. She has been taking Wegovy  0.5 mg. Overall she is doing well on the dose but does have some nausea with this dose. )  HPI Erika Solomon presents today for 1 month follow-up on weight management after increasing Wegovy  to 0.5 mg dose last visit, resulting in additional weight loss of  4 pounds over the last month, bringing her total weight loss to 16 pounds since initiation.  She mentions she has had more nausea this month and has started administering the medication at night instead.  No vomiting or other issues.  She has a more dedicated fitness regimen, reporting at least 2 sessions of strength training per week with a guided YouTube workout often using dumbbells up to 15 pounds with modifications as necessary.   Also following up on moderate-severe iron  deficiency anemia, for which she received 2 iron  infusions on 03/23/2024 and 04/02/2024, also continues iron  bisglycinate to 2 capsules daily.  She states the iron  infusions made her feel poorly for about 24 hours, and do not seem to have changed her energy levels or conferred any benefit that she can tell.  We suspected this iron  deficiency to be the result of metromenorrhagia, but interestingly she states that since starting the Wegovy , her menstrual cycle has been more regular and much lighter.  She is not presently on any type of birth control.   Medication list has been reviewed and updated.  Current Meds  Medication Sig   OVER THE COUNTER MEDICATION Iron  Bisglycinate   [DISCONTINUED] WEGOVY  0.5 MG/0.5ML SOAJ Inject 0.5 mg into the skin once a week.     Review of Systems  Patient Active Problem List   Diagnosis Date Noted   Metabolic syndrome 02/13/2024   Class 2 severe obesity with serious comorbidity in adult [facility]) 01/13/2024   Primary hypertension 01/13/2024   History of  gestational diabetes 01/13/2024   Iron  deficiency anemia 12/26/2016    Allergies  Allergen Reactions   Albuterol Anaphylaxis   Benadryl  [Diphenhydramine ] Anaphylaxis   Buspar [Buspirone] Anaphylaxis   Strawberry Extract Anaphylaxis    Immunization History  Administered Date(s) Administered   Influenza,inj,Quad PF,6+ Mos 09/30/2016   MMR 01/02/2023   Moderna Covid-19 Vaccine Bivalent Booster 76yrs & up 10/09/2020   PPD Test 11/15/2021   Tdap 10/12/2022   Varicella 01/02/2023    Past Surgical History:  Procedure Laterality Date   CESAREAN SECTION N/A 12/23/2016   Procedure: CESAREAN SECTION;  Surgeon: Glory High, MD;  Location: [facility] ORS;  Service: Obstetrics;  Laterality: N/A;   CESAREAN SECTION N/A 12/31/2022   Procedure: REPEAT CESAREAN DELIVERY;  Surgeon: Janit Alm Agent, MD;  Location: [facility] ORS;  Service: Obstetrics;  Laterality: N/A;   WISDOM TOOTH EXTRACTION     four; 2018    Social History   Tobacco Use   Smoking status: Never   Smokeless tobacco: Never  Vaping Use   Vaping status: Never Used  Substance Use Topics   Alcohol use: No   Drug use: No    Family History  Problem Relation Age of Onset   Diabetes Mother    Thyroid disease Mother    Epilepsy Mother    Anxiety disorder Mother    Miscarriages / Stillbirths Mother    Hypertension Father    Alcohol abuse Father    Depression Father  Sickle cell anemia Sister    Hypertension Brother    Autism spectrum disorder Brother    ADD / ADHD Brother    Asthma Brother    Intellectual disability Brother    Hypertension Maternal Grandmother    Arthritis Maternal Grandmother    Diabetes Maternal Grandfather    COPD Maternal Grandfather    Stroke Maternal Grandfather    Diabetes Paternal Grandmother    Other Paternal Grandmother        problem c kidneys and liver   Hypertension Paternal Grandfather    ADD / ADHD Son    ADD / ADHD Son    Cancer Maternal Aunt    Intellectual disability Sister          03/15/2024   10:48 AM 02/13/2024    9:20 AM 01/13/2024   10:09 AM  GAD 7 : Generalized Anxiety Score  Nervous, Anxious, on Edge 0 0 0  Control/stop worrying 0 0 0  Worry too much - different things 0 0 0  Trouble relaxing 0 0 0  Restless 0 0 0  Easily annoyed or irritable 0 0 0  Afraid - awful might happen  0 0  Total GAD 7 Score  0 0  Anxiety Difficulty Not difficult at all Not difficult at all Not difficult at all       03/15/2024   10:48 AM 02/13/2024    9:20 AM 01/13/2024   10:09 AM  Depression screen PHQ 2/9  Decreased Interest 0 0 0  Down, Depressed, Hopeless 0 0 0  PHQ - 2 Score 0 0 0  Altered sleeping 1 2 2   Tired, decreased energy 0 2 2  Change in appetite 0 2 2  Feeling bad or failure about yourself  0 2 2  Trouble concentrating 0 0 0  Moving slowly or fidgety/restless 0 0 0  Suicidal thoughts 0 0 0  PHQ-9 Score 1 8 8   Difficult doing work/chores Not difficult at all Not difficult at all Not difficult at all    BP Readings from Last 3 Encounters:  04/13/24 114/80  04/02/24 127/81  03/23/24 135/84    Wt Readings from Last 3 Encounters:  04/13/24 218 lb 3.2 oz (99 kg)  03/23/24 220 lb 7.4 oz (100 kg)  03/15/24 222 lb (100.7 kg)    BP 114/80   Pulse 81   Temp 98.4 F (36.9 C)   Ht 5' 4 (1.626 m)   Wt 218 lb 3.2 oz (99 kg)   SpO2 98%   BMI 37.45 kg/m   Physical Exam Vitals and nursing note reviewed.  Constitutional:      Appearance: Normal appearance.  Cardiovascular:     Rate and Rhythm: Normal rate.  Pulmonary:     Effort: Pulmonary effort is normal.  Abdominal:     General: There is no distension.  Musculoskeletal:        General: Normal range of motion.  Skin:    General: Skin is warm and dry.  Neurological:     Mental Status: She is alert and oriented to person, place, and time.     Gait: Gait is intact.  Psychiatric:        Mood and Affect: Mood and affect normal.     Recent Labs     Component Value Date/Time   NA  142 01/13/2024 1050   NA 140 06/19/2014 0450   K 5.0 01/13/2024 1050   K 4.0 06/19/2014 0450   CL 107 (H) 01/13/2024  1050   CL 109 (H) 06/19/2014 0450   CO2 20 01/13/2024 1050   CO2 20 (L) 06/19/2014 0450   GLUCOSE 96 01/13/2024 1050   GLUCOSE 113 (H) 04/05/2023 1744   GLUCOSE 86 06/19/2014 0450   BUN 10 01/13/2024 1050   BUN 7 06/19/2014 0450   CREATININE 0.72 01/13/2024 1050   CREATININE 0.65 06/19/2014 0450   CALCIUM 9.5 01/13/2024 1050   CALCIUM 8.5 (L) 06/19/2014 0450   PROT 7.5 01/13/2024 1050   PROT 6.7 06/19/2014 0450   ALBUMIN 4.6 01/13/2024 1050   ALBUMIN 2.7 (L) 06/19/2014 0450   AST 22 01/13/2024 1050   AST 27 (H) 06/19/2014 0450   ALT 24 01/13/2024 1050   ALT 15 06/19/2014 0450   ALKPHOS 55 01/13/2024 1050   ALKPHOS 127 (H) 06/19/2014 0450   BILITOT 0.4 01/13/2024 1050   BILITOT 0.2 06/19/2014 0450   GFRNONAA >60 04/05/2023 1744   GFRNONAA >60 06/19/2014 0450   GFRAA 142 07/11/2019 1425   GFRAA >60 06/19/2014 0450    Lab Results  Component Value Date   WBC 6.5 03/15/2024   HGB 8.1 (L) 03/15/2024   HCT 29.6 (L) 03/15/2024   MCV 72 (L) 03/15/2024   PLT 292 03/15/2024   Lab Results  Component Value Date   HGBA1C 5.6 01/13/2024   Lab Results  Component Value Date   CHOL 107 01/13/2024   HDL 28 (L) 01/13/2024   LDLCALC 43 01/13/2024   TRIG 227 (H) 01/13/2024   CHOLHDL 3.8 01/13/2024   Lab Results  Component Value Date   TSH 1.800 01/13/2024     Assessment and Plan:  Class 2 severe obesity with serious comorbidity and body mass index (BMI) of 37.0 to 37.9 in adult, unspecified obesity type (HCC) Assessment & Plan: Due to report of increased nausea, will stay at the current dose of Wegovy  for now, continue 0.5 mg weekly.  Infusion past month may be a confounding variable contributing to the nausea.  Advised the importance of adequate protein intake on this medication as well as resistance training which will not only improve insulin  sensitivity  but also build muscle mass and reduce risk of atrophy.  Advised importance of portion control and slower eating on this medication to minimize GI side effects. Avoid high fat foods as these may cause discomfort. Patient aware of potential for weight regain when eventually stopping the medication. For this reason, continued efforts toward improving lifestyle will be particularly important moving forward.    Orders: -     Wegovy ; Inject 0.5 mg into the skin once a week.  Dispense: 2 mL; Refill: 0  Iron  deficiency anemia, unspecified iron  deficiency anemia type Assessment & Plan: Possibly from metromenorrhagia, but it seems her menses have stabilized.  Repeat CBC and iron  labs, follow clinically, continue iron  bisglycinate 2 capsules/day to maintain healthy iron  levels.  Orders: -     CBC with Differential/Platelet -     Iron , TIBC and Ferritin Panel     Return in about 4 weeks (around 05/11/2024) for OV f/u Wegovy .    Rolan Hoyle, PA-C, DMSc, Nutritionist [facility] Primary Care and Sports Medicine [facility] [phone]

## 2024-04-13 NOTE — Assessment & Plan Note (Signed)
 Due to report of increased nausea, will stay at the current dose of Wegovy  for now, continue 0.5 mg weekly.  Infusion past month may be a confounding variable contributing to the nausea.  Advised the importance of adequate protein intake on this medication as well as resistance training which will not only improve insulin  sensitivity but also build muscle mass and reduce risk of atrophy.  Advised importance of portion control and slower eating on this medication to minimize GI side effects. Avoid high fat foods as these may cause discomfort. Patient aware of potential for weight regain when eventually stopping the medication. For this reason, continued efforts toward improving lifestyle will be particularly important moving forward.

## 2024-04-13 NOTE — Assessment & Plan Note (Signed)
 Possibly from metromenorrhagia, but it seems her menses have stabilized.  Repeat CBC and iron  labs, follow clinically, continue iron  bisglycinate 2 capsules/day to maintain healthy iron  levels.

## 2024-05-05 LAB — CBC WITH DIFFERENTIAL/PLATELET
Basophils Absolute: 0 x10E3/uL (ref 0.0–0.2)
Basos: 1 %
EOS (ABSOLUTE): 0.1 x10E3/uL (ref 0.0–0.4)
Eos: 1 %
Hematocrit: 38.1 % (ref 34.0–46.6)
Hemoglobin: 11.6 g/dL (ref 11.1–15.9)
Immature Grans (Abs): 0 x10E3/uL (ref 0.0–0.1)
Immature Granulocytes: 0 %
Lymphocytes Absolute: 1.9 x10E3/uL (ref 0.7–3.1)
Lymphs: 29 %
MCH: 25 pg — ABNORMAL LOW (ref 26.6–33.0)
MCHC: 30.4 g/dL — ABNORMAL LOW (ref 31.5–35.7)
MCV: 82 fL (ref 79–97)
Monocytes Absolute: 0.4 x10E3/uL (ref 0.1–0.9)
Monocytes: 6 %
Neutrophils Absolute: 4.2 x10E3/uL (ref 1.4–7.0)
Neutrophils: 63 %
Platelets: 268 x10E3/uL (ref 150–450)
RBC: 4.64 x10E6/uL (ref 3.77–5.28)
RDW: 24.8 % — ABNORMAL HIGH (ref 11.7–15.4)
WBC: 6.6 x10E3/uL (ref 3.4–10.8)

## 2024-05-05 LAB — IRON,TIBC AND FERRITIN PANEL
Ferritin: 26 ng/mL (ref 15–150)
Iron Saturation: 12 % — ABNORMAL LOW (ref 15–55)
Iron: 48 ug/dL (ref 27–159)
Total Iron Binding Capacity: 387 ug/dL (ref 250–450)
UIBC: 339 ug/dL (ref 131–425)

## 2024-05-07 ENCOUNTER — Ambulatory Visit: Payer: Self-pay | Admitting: Physician Assistant

## 2024-05-10 ENCOUNTER — Encounter: Payer: Self-pay | Admitting: Physician Assistant

## 2024-05-10 ENCOUNTER — Ambulatory Visit: Admitting: Physician Assistant

## 2024-05-10 ENCOUNTER — Other Ambulatory Visit: Payer: Self-pay

## 2024-05-10 VITALS — BP 108/84 | HR 87 | Temp 97.9°F | Ht 64.0 in | Wt 216.0 lb

## 2024-05-10 DIAGNOSIS — E66812 Obesity, class 2: Secondary | ICD-10-CM

## 2024-05-10 DIAGNOSIS — Z6837 Body mass index (BMI) 37.0-37.9, adult: Secondary | ICD-10-CM

## 2024-05-10 MED ORDER — WEGOVY 1 MG/0.5ML ~~LOC~~ SOAJ
1.0000 mg | SUBCUTANEOUS | 0 refills | Status: DC
Start: 2024-05-10 — End: 2024-06-08
  Filled 2024-05-10: qty 2, 28d supply, fill #0

## 2024-05-10 NOTE — Assessment & Plan Note (Signed)
 Increase Wegovy  1 mg/week.  Advised the importance of adequate protein intake on this medication as well as resistance training which will not only improve insulin  sensitivity but also build muscle mass and reduce risk of atrophy.  Advised importance of portion control and slower eating on this medication to minimize GI side effects. Avoid high fat foods as these may cause discomfort. Patient aware of potential for weight regain when eventually stopping the medication. For this reason, continued efforts toward improving lifestyle will be particularly important moving forward.

## 2024-05-10 NOTE — Progress Notes (Signed)
 Date:  05/10/2024   Name:  Erika Solomon   DOB:  10-10-1995   MRN:  [id number]   Chief Complaint: Weight Check (No side effects wants to increase, down 2 lbs)  HPI Samanth presents today for 1 month follow-up on weight management after continuing Wegovy  to 0.5 mg dose last visit, resulting in additional weight loss of 2 pounds over the last month, bringing her total weight loss to 18 pounds since initiation.  She continues to administer the medication every Friday night with good tolerance. She continues with dedicated fitness regimen, reporting at least 2 sessions of strength training per week with a guided YouTube workout often using dumbbells up to 15 pounds with modifications as necessary.   Medication list has been reviewed and updated.  Current Meds  Medication Sig   OVER THE COUNTER MEDICATION Iron  Bisglycinate   valACYclovir  (VALTREX ) 1000 MG tablet Take 2 tablets (2,000 mg total) by mouth 2 (two) times daily. Take 2 tablets every 12h for ONE day at the start of a cold sore, then 1 tablet every 12h for the following day, then stop.   WEGOVY  1 MG/0.5ML SOAJ SQ injection Inject 1 mg into the skin once a week. Use this dose for 1 month (4 shots) and then increase to next higher dose.   [DISCONTINUED] WEGOVY  0.5 MG/0.5ML SOAJ Inject 0.5 mg into the skin once a week.     Review of Systems  Patient Active Problem List   Diagnosis Date Noted   Metabolic syndrome 02/13/2024   Class 2 severe obesity with serious comorbidity in adult [facility]) 01/13/2024   Primary hypertension 01/13/2024   History of gestational diabetes 01/13/2024   Iron  deficiency anemia 12/26/2016    Allergies  Allergen Reactions   Albuterol Anaphylaxis   Benadryl  [Diphenhydramine ] Anaphylaxis   Buspar [Buspirone] Anaphylaxis   Strawberry Extract Anaphylaxis    Immunization History  Administered Date(s) Administered   Influenza,inj,Quad PF,6+ Mos 09/30/2016   MMR 01/02/2023   Moderna Covid-19 Vaccine  Bivalent Booster 50yrs & up 10/09/2020   PPD Test 11/15/2021   Tdap 10/12/2022   Varicella 01/02/2023    Past Surgical History:  Procedure Laterality Date   CESAREAN SECTION N/A 12/23/2016   Procedure: CESAREAN SECTION;  Surgeon: Glory High, MD;  Location: [facility] ORS;  Service: Obstetrics;  Laterality: N/A;   CESAREAN SECTION N/A 12/31/2022   Procedure: REPEAT CESAREAN DELIVERY;  Surgeon: Janit Alm Agent, MD;  Location: [facility] ORS;  Service: Obstetrics;  Laterality: N/A;   WISDOM TOOTH EXTRACTION     four; 2018    Social History   Tobacco Use   Smoking status: Never   Smokeless tobacco: Never  Vaping Use   Vaping status: Never Used  Substance Use Topics   Alcohol use: No   Drug use: No    Family History  Problem Relation Age of Onset   Diabetes Mother    Thyroid disease Mother    Epilepsy Mother    Anxiety disorder Mother    Miscarriages / [place] Mother    Hypertension Father    Alcohol abuse Father    Depression Father    Sickle cell anemia Sister    Hypertension Brother    Autism spectrum disorder Brother    ADD / ADHD Brother    Asthma Brother    Intellectual disability Brother    Hypertension Maternal Grandmother    Arthritis Maternal Grandmother    Diabetes Maternal Grandfather    COPD Maternal Grandfather  Stroke Maternal Grandfather    Diabetes Paternal Grandmother    Other Paternal Grandmother        problem c kidneys and liver   Hypertension Paternal Grandfather    ADD / ADHD Son    ADD / ADHD Son    Cancer Maternal Aunt    Intellectual disability Sister         05/10/2024   10:57 AM 03/15/2024   10:48 AM 02/13/2024    9:20 AM 01/13/2024   10:09 AM  GAD 7 : Generalized Anxiety Score  Nervous, Anxious, on Edge 0 0 0 0  Control/stop worrying 0 0 0 0  Worry too much - different things 0 0 0 0  Trouble relaxing 0 0 0 0  Restless 0 0 0 0  Easily annoyed or irritable 1 0 0 0  Afraid - awful might happen 0  0 0  Total GAD 7 Score 1  0 0   Anxiety Difficulty Not difficult at all Not difficult at all Not difficult at all Not difficult at all       05/10/2024   10:57 AM 03/15/2024   10:48 AM 02/13/2024    9:20 AM  Depression screen PHQ 2/9  Decreased Interest 0 0 0  Down, Depressed, Hopeless 0 0 0  PHQ - 2 Score 0 0 0  Altered sleeping  1 2  Tired, decreased energy  0 2  Change in appetite  0 2  Feeling bad or failure about yourself   0 2  Trouble concentrating  0 0  Moving slowly or fidgety/restless  0 0  Suicidal thoughts  0 0  PHQ-9 Score  1 8  Difficult doing work/chores  Not difficult at all Not difficult at all    BP Readings from Last 3 Encounters:  05/10/24 108/84  04/13/24 114/80  03/15/24 128/78    Wt Readings from Last 3 Encounters:  05/10/24 216 lb (98 kg)  04/13/24 218 lb 3.2 oz (99 kg)  03/15/24 222 lb (100.7 kg)    BP 108/84   Pulse 87   Temp 97.9 F (36.6 C)   Ht 5' 4 (1.626 m)   Wt 216 lb (98 kg)   SpO2 98%   Breastfeeding No   BMI 37.08 kg/m   Physical Exam Vitals and nursing note reviewed.  Constitutional:      Appearance: Normal appearance. She is obese.  Cardiovascular:     Rate and Rhythm: Normal rate.  Pulmonary:     Effort: Pulmonary effort is normal.  Abdominal:     General: There is no distension.  Musculoskeletal:        General: Normal range of motion.  Skin:    General: Skin is warm and dry.  Neurological:     Mental Status: She is alert and oriented to person, place, and time.     Gait: Gait is intact.  Psychiatric:        Mood and Affect: Mood and affect normal.     Recent Labs     Component Value Date/Time   NA 142 01/13/2024 1050   NA 140 06/19/2014 0450   K 5.0 01/13/2024 1050   K 4.0 06/19/2014 0450   CL 107 (H) 01/13/2024 1050   CL 109 (H) 06/19/2014 0450   CO2 20 01/13/2024 1050   CO2 20 (L) 06/19/2014 0450   GLUCOSE 96 01/13/2024 1050   GLUCOSE 113 (H) 04/05/2023 1744   GLUCOSE 86 06/19/2014 0450   BUN 10 01/13/2024  1050   BUN 7  06/19/2014 0450   CREATININE 0.72 01/13/2024 1050   CREATININE 0.65 06/19/2014 0450   CALCIUM 9.5 01/13/2024 1050   CALCIUM 8.5 (L) 06/19/2014 0450   PROT 7.5 01/13/2024 1050   PROT 6.7 06/19/2014 0450   ALBUMIN 4.6 01/13/2024 1050   ALBUMIN 2.7 (L) 06/19/2014 0450   AST 22 01/13/2024 1050   AST 27 (H) 06/19/2014 0450   ALT 24 01/13/2024 1050   ALT 15 06/19/2014 0450   ALKPHOS 55 01/13/2024 1050   ALKPHOS 127 (H) 06/19/2014 0450   BILITOT 0.4 01/13/2024 1050   BILITOT 0.2 06/19/2014 0450   GFRNONAA >60 04/05/2023 1744   GFRNONAA >60 06/19/2014 0450   GFRAA 142 07/11/2019 1425   GFRAA >60 06/19/2014 0450    Lab Results  Component Value Date   WBC 6.6 05/04/2024   HGB 11.6 05/04/2024   HCT 38.1 05/04/2024   MCV 82 05/04/2024   PLT 268 05/04/2024   Lab Results  Component Value Date   HGBA1C 5.6 01/13/2024   HGBA1C 5.4 06/30/2022   Lab Results  Component Value Date   CHOL 107 01/13/2024   HDL 28 (L) 01/13/2024   LDLCALC 43 01/13/2024   TRIG 227 (H) 01/13/2024   CHOLHDL 3.8 01/13/2024   Lab Results  Component Value Date   TSH 1.800 01/13/2024      Assessment and Plan:  Class 2 severe obesity with serious comorbidity and body mass index (BMI) of 37.0 to 37.9 in adult, unspecified obesity type (HCC) Assessment & Plan: Increase Wegovy  1 mg/week.  Advised the importance of adequate protein intake on this medication as well as resistance training which will not only improve insulin  sensitivity but also build muscle mass and reduce risk of atrophy.  Advised importance of portion control and slower eating on this medication to minimize GI side effects. Avoid high fat foods as these may cause discomfort. Patient aware of potential for weight regain when eventually stopping the medication. For this reason, continued efforts toward improving lifestyle will be particularly important moving forward.    Orders: -     Wegovy ; Inject 1 mg into the skin once a week. Use this  dose for 1 month (4 shots) and then increase to next higher dose.  Dispense: 2 mL; Refill: 0     Return in about 4 weeks (around 06/07/2024) for OV f/u Wegovy .    Rolan Hoyle, PA-C, DMSc, Nutritionist [facility] Primary Care and Sports Medicine [facility] [phone]

## 2024-05-23 ENCOUNTER — Ambulatory Visit: Admitting: Physician Assistant

## 2024-06-08 ENCOUNTER — Other Ambulatory Visit: Payer: Self-pay

## 2024-06-08 ENCOUNTER — Encounter: Payer: Self-pay | Admitting: Physician Assistant

## 2024-06-08 ENCOUNTER — Ambulatory Visit (INDEPENDENT_AMBULATORY_CARE_PROVIDER_SITE_OTHER): Admitting: Physician Assistant

## 2024-06-08 VITALS — BP 120/88 | HR 96 | Temp 98.7°F | Ht 64.0 in | Wt 209.0 lb

## 2024-06-08 DIAGNOSIS — Z0000 Encounter for general adult medical examination without abnormal findings: Secondary | ICD-10-CM | POA: Diagnosis not present

## 2024-06-08 DIAGNOSIS — Z6835 Body mass index (BMI) 35.0-35.9, adult: Secondary | ICD-10-CM

## 2024-06-08 DIAGNOSIS — E66812 Obesity, class 2: Secondary | ICD-10-CM

## 2024-06-08 MED ORDER — WEGOVY 1.7 MG/0.75ML ~~LOC~~ SOAJ
1.7000 mg | SUBCUTANEOUS | 0 refills | Status: DC
Start: 2024-06-08 — End: 2024-07-04
  Filled 2024-06-08: qty 3, 28d supply, fill #0

## 2024-06-08 NOTE — Progress Notes (Signed)
 Date:  06/08/2024   Name:  Erika Solomon   DOB:  1996/04/10   MRN:  [id number]   Chief Complaint: Annual Exam (Down 7 lbs/)  HPI Erika Solomon presents today for routine annual physical as well as 1 month follow-up on weight management after increasing Wegovy  to 1 mg dose last visit, resulting in additional weight loss of 7 pounds over the last month, bringing her total weight loss to 25 pounds since initiation.  She continues with dedicated fitness regimen, reporting at least 2 sessions of strength training per week with a guided YouTube workout often using dumbbells up to 15 pounds with modifications as necessary.  She continues with medication administration every Friday night, but notes that by the following Thursday morning her hunger and appetite begins to return.   Last Physical: >3y ago Last Dental Exam: 7y ago. Planning for appt after Nov Last Eye Exam: Feb 2025 Last Pap: 2023 NILM, no hx abnormals    Medication list has been reviewed and updated.  Current Meds  Medication Sig   OVER THE COUNTER MEDICATION Iron  Bisglycinate   valACYclovir  (VALTREX ) 1000 MG tablet Take 2 tablets (2,000 mg total) by mouth 2 (two) times daily. Take 2 tablets every 12h for ONE day at the start of a cold sore, then 1 tablet every 12h for the following day, then stop.   WEGOVY  1.7 MG/0.75ML SOAJ SQ injection Inject 1.7 mg into the skin once a week.   [DISCONTINUED] WEGOVY  1 MG/0.5ML SOAJ SQ injection Inject 1 mg into the skin once a week. Use this dose for 1 month (4 shots) and then increase to next higher dose.     Review of Systems  Patient Active Problem List   Diagnosis Date Noted   Metabolic syndrome 02/13/2024   Class 2 severe obesity with serious comorbidity in adult Erika Solomon) 01/13/2024   Primary hypertension 01/13/2024   History of gestational diabetes 01/13/2024   Iron  deficiency anemia 12/26/2016    Allergies  Allergen Reactions   Albuterol Anaphylaxis   Benadryl  [Diphenhydramine ]  Anaphylaxis   Buspar [Buspirone] Anaphylaxis   Strawberry Extract Anaphylaxis    Immunization History  Administered Date(s) Administered   HPV 9-valent 06/08/2006, 07/02/2010, 12/30/2010   Influenza,inj,Quad PF,6+ Mos 09/30/2016   MMR 01/02/2023   Moderna Covid-19 Vaccine Bivalent Booster 99yrs & up 10/09/2020   PPD Test 11/15/2021   Tdap 10/12/2022   Varicella 01/02/2023    Past Surgical History:  Procedure Laterality Date   CESAREAN SECTION N/A 12/23/2016   Procedure: CESAREAN SECTION;  Surgeon: Erika High, MD;  Location: [facility] ORS;  Service: Obstetrics;  Laterality: N/A;   CESAREAN SECTION N/A 12/31/2022   Procedure: REPEAT CESAREAN DELIVERY;  Surgeon: Erika Alm Agent, MD;  Location: [facility] ORS;  Service: Obstetrics;  Laterality: N/A;   WISDOM TOOTH EXTRACTION     four; 2018    Social History   Tobacco Use   Smoking status: Never   Smokeless tobacco: Never  Vaping Use   Vaping status: Never Used  Substance Use Topics   Alcohol use: No   Drug use: No    Family History  Problem Relation Age of Onset   Diabetes Mother    Thyroid disease Mother    Epilepsy Mother    Anxiety disorder Mother    Miscarriages / Stillbirths Mother    Hypertension Father    Alcohol abuse Father    Depression Father    Sickle cell anemia Sister    Hypertension Brother  Autism spectrum disorder Brother    ADD / ADHD Brother    Asthma Brother    Intellectual disability Brother    Hypertension Maternal Grandmother    Arthritis Maternal Grandmother    Diabetes Maternal Grandfather    COPD Maternal Grandfather    Stroke Maternal Grandfather    Diabetes Paternal Grandmother    Other Paternal Grandmother        problem c kidneys and liver   Hypertension Paternal Grandfather    ADD / ADHD Son    ADD / ADHD Son    Cancer Maternal Aunt    Intellectual disability Sister         05/10/2024   10:57 AM 03/15/2024   10:48 AM 02/13/2024    9:20 AM 01/13/2024   10:09 AM  GAD 7 :  Generalized Anxiety Score  Nervous, Anxious, on Edge 0 0 0 0  Control/stop worrying 0 0 0 0  Worry too much - different things 0 0 0 0  Trouble relaxing 0 0 0 0  Restless 0 0 0 0  Easily annoyed or irritable 1 0 0 0  Afraid - awful might happen 0  0 0  Total GAD 7 Score 1  0 0  Anxiety Difficulty Not difficult at all Not difficult at all Not difficult at all Not difficult at all       05/10/2024   10:57 AM 03/15/2024   10:48 AM 02/13/2024    9:20 AM  Depression screen PHQ 2/9  Decreased Interest 0 0 0  Down, Depressed, Hopeless 0 0 0  PHQ - 2 Score 0 0 0  Altered sleeping  1 2  Tired, decreased energy  0 2  Change in appetite  0 2  Feeling bad or failure about yourself   0 2  Trouble concentrating  0 0  Moving slowly or fidgety/restless  0 0  Suicidal thoughts  0 0  PHQ-9 Score  1 8  Difficult doing work/chores  Not difficult at all Not difficult at all    BP Readings from Last 3 Encounters:  06/08/24 120/88  05/10/24 108/84  04/13/24 114/80    Wt Readings from Last 3 Encounters:  06/08/24 209 lb (94.8 kg)  05/10/24 216 lb (98 kg)  04/13/24 218 lb 3.2 oz (99 kg)    BP 120/88   Pulse 96   Temp 98.7 F (37.1 C)   Ht 5' 4 (1.626 m)   Wt 209 lb (94.8 kg)   SpO2 100%   BMI 35.87 kg/m   Physical Exam Vitals and nursing note reviewed. Exam conducted with a chaperone present.  Constitutional:      Appearance: Normal appearance. She is well-groomed.  HENT:     Ears:     Comments: EAC clear bilaterally with good view of TM which is without effusion or erythema.     Nose: Nose normal.     Mouth/Throat:     Mouth: Mucous membranes are moist. No oral lesions.     Dentition: Normal dentition.     Pharynx: Uvula midline. No posterior oropharyngeal erythema.  Eyes:     General: Vision grossly intact.     Extraocular Movements: Extraocular movements intact.     Conjunctiva/sclera: Conjunctivae normal.     Pupils: Pupils are equal, round, and reactive to light.   Neck:     Thyroid: No thyroid mass or thyromegaly.  Cardiovascular:     Rate and Rhythm: Normal rate and regular rhythm.  Heart sounds: S1 normal and S2 normal. No murmur heard.    No friction rub. No gallop.     Comments: Pulses 2+ at radial, PT, DP bilaterally. No carotid bruit. No peripheral edema Pulmonary:     Effort: Pulmonary effort is normal.     Breath sounds: Normal breath sounds.  Chest:     Comments: Breast exam typical for age without suspicious masses, asymmetry, or lymphadenopathy. There are a few small rounded mobile well-defined masses bilaterally likely constituting benign cysts, presumably hormone-dependent. These were pointed out to patient for observation, though she was reassured no cause for alarm.  Abdominal:     General: Bowel sounds are normal.     Palpations: Abdomen is soft. There is no mass.     Tenderness: There is no abdominal tenderness.  Musculoskeletal:     Comments: Full ROM with strength 5/5 bilateral upper and lower extremities  Lymphadenopathy:     Cervical: No cervical adenopathy.  Skin:    General: Skin is warm.     Capillary Refill: Capillary refill takes less than 2 seconds.     Findings: No lesion or rash.  Neurological:     Mental Status: She is alert and oriented to person, place, and time.     Cranial Nerves: Cranial nerves 2-12 are intact.     Gait: Gait is intact.  Psychiatric:        Mood and Affect: Mood and affect normal.        Behavior: Behavior normal.     Recent Labs     Component Value Date/Time   NA 142 01/13/2024 1050   NA 140 06/19/2014 0450   K 5.0 01/13/2024 1050   K 4.0 06/19/2014 0450   CL 107 (H) 01/13/2024 1050   CL 109 (H) 06/19/2014 0450   CO2 20 01/13/2024 1050   CO2 20 (L) 06/19/2014 0450   GLUCOSE 96 01/13/2024 1050   GLUCOSE 113 (H) 04/05/2023 1744   GLUCOSE 86 06/19/2014 0450   BUN 10 01/13/2024 1050   BUN 7 06/19/2014 0450   CREATININE 0.72 01/13/2024 1050   CREATININE 0.65 06/19/2014  0450   CALCIUM 9.5 01/13/2024 1050   CALCIUM 8.5 (L) 06/19/2014 0450   PROT 7.5 01/13/2024 1050   PROT 6.7 06/19/2014 0450   ALBUMIN 4.6 01/13/2024 1050   ALBUMIN 2.7 (L) 06/19/2014 0450   AST 22 01/13/2024 1050   AST 27 (H) 06/19/2014 0450   ALT 24 01/13/2024 1050   ALT 15 06/19/2014 0450   ALKPHOS 55 01/13/2024 1050   ALKPHOS 127 (H) 06/19/2014 0450   BILITOT 0.4 01/13/2024 1050   BILITOT 0.2 06/19/2014 0450   GFRNONAA >60 04/05/2023 1744   GFRNONAA >60 06/19/2014 0450   GFRAA 142 07/11/2019 1425   GFRAA >60 06/19/2014 0450    Lab Results  Component Value Date   WBC 6.6 05/04/2024   HGB 11.6 05/04/2024   HCT 38.1 05/04/2024   MCV 82 05/04/2024   PLT 268 05/04/2024   Lab Results  Component Value Date   HGBA1C 5.6 01/13/2024   HGBA1C 5.4 06/30/2022   Lab Results  Component Value Date   CHOL 107 01/13/2024   HDL 28 (L) 01/13/2024   LDLCALC 43 01/13/2024   TRIG 227 (H) 01/13/2024   CHOLHDL 3.8 01/13/2024   Lab Results  Component Value Date   TSH 1.800 01/13/2024      Assessment and Plan:  1. Annual physical exam (Primary) Encouraged healthy lifestyle including regular physical activity  and consumption of whole fruits and vegetables. Encouraged routine dental and eye exams. Vaccinations up to date.    2. Class 2 severe obesity with serious comorbidity and body mass index (BMI) of 35.0 to 35.9 in adult, unspecified obesity type (HCC) Increase to Wegovy  1.7 mg/wk.   Advised the importance of adequate protein intake on this medication as well as resistance training which will not only improve insulin  sensitivity but also build muscle mass and reduce risk of atrophy.  Advised importance of portion control and slower eating on this medication to minimize GI side effects. Avoid Solomon fat foods as these may cause discomfort. Patient aware of potential for weight regain when eventually stopping the medication. For this reason, continued efforts toward improving lifestyle  will be particularly important moving forward.   - WEGOVY  1.7 MG/0.75ML SOAJ SQ injection; Inject 1.7 mg into the skin once a week.  Dispense: 3 mL; Refill: 0     Return in about 4 weeks (around 07/06/2024) for OV f/u Wegovy .    Rolan Hoyle, PA-C, DMSc, Nutritionist [place] [facility] [phone]

## 2024-06-14 ENCOUNTER — Ambulatory Visit: Admitting: Physician Assistant

## 2024-07-04 ENCOUNTER — Other Ambulatory Visit: Payer: Self-pay

## 2024-07-04 ENCOUNTER — Other Ambulatory Visit: Payer: Self-pay | Admitting: Physician Assistant

## 2024-07-04 DIAGNOSIS — Z6835 Body mass index (BMI) 35.0-35.9, adult: Secondary | ICD-10-CM

## 2024-07-04 MED ORDER — WEGOVY 1 MG/0.5ML ~~LOC~~ SOAJ
1.0000 mg | SUBCUTANEOUS | 0 refills | Status: DC
  Filled 2024-07-04 (×2): qty 2, 28d supply, fill #0

## 2024-07-04 NOTE — Telephone Encounter (Signed)
 FYI  KP

## 2024-07-04 NOTE — Telephone Encounter (Signed)
 Spoke with patient via phone.  Even prior to getting COVID, she had nausea and vomiting at the 1.7 mg dose of Wegovy .  For this reason, we will be dropping her back down to the 1 mg dose for the next month.  I have sent this to her pharmacy.  She does have Medicaid, so this medication will not be covered, but she does have a voucher for the next 1 month supply so she can use this.  I have encouraged her to reschedule her follow-up visit with me from 07/06/2024 to sometime in late October roughly 4 weeks from now give or take.  She will do this via MyChart.  At this follow-up, we will need to discuss alternative medications for her due to insurance coverage

## 2024-07-05 ENCOUNTER — Other Ambulatory Visit: Payer: Self-pay

## 2024-07-06 ENCOUNTER — Ambulatory Visit: Admitting: Physician Assistant

## 2024-07-09 ENCOUNTER — Ambulatory Visit: Admitting: Physician Assistant

## 2024-07-30 ENCOUNTER — Encounter: Payer: Self-pay | Admitting: Physician Assistant

## 2024-07-30 ENCOUNTER — Other Ambulatory Visit (HOSPITAL_COMMUNITY): Payer: Self-pay

## 2024-07-30 ENCOUNTER — Ambulatory Visit: Admitting: Physician Assistant

## 2024-07-30 ENCOUNTER — Telehealth: Payer: Self-pay | Admitting: Physician Assistant

## 2024-07-30 VITALS — BP 132/82 | HR 98 | Temp 97.8°F | Ht 64.0 in | Wt 200.0 lb

## 2024-07-30 DIAGNOSIS — D509 Iron deficiency anemia, unspecified: Secondary | ICD-10-CM

## 2024-07-30 DIAGNOSIS — Z832 Family history of diseases of the blood and blood-forming organs and certain disorders involving the immune mechanism: Secondary | ICD-10-CM | POA: Diagnosis not present

## 2024-07-30 DIAGNOSIS — Z23 Encounter for immunization: Secondary | ICD-10-CM | POA: Diagnosis not present

## 2024-07-30 DIAGNOSIS — Z6835 Body mass index (BMI) 35.0-35.9, adult: Secondary | ICD-10-CM | POA: Diagnosis not present

## 2024-07-30 DIAGNOSIS — E66812 Obesity, class 2: Secondary | ICD-10-CM | POA: Diagnosis not present

## 2024-07-30 NOTE — Telephone Encounter (Signed)
 Good morning, below is the list of preferred meds for weight loss that her plan will cover:    I test billed saxenda, contrave and qsymia all 3 rejected for plan benefit exclusion

## 2024-07-30 NOTE — Telephone Encounter (Signed)
 Good morning, is it possible to see what medications are covered for weight loss on this patient's Medicaid plan? The EMR seems to suggest Qsymia and Contrave are both coverage exclusions. Can you confirm that and maybe check on Saxenda?

## 2024-07-30 NOTE — Progress Notes (Signed)
 Date:  07/30/2024   Name:  Erika Solomon   DOB:  May 30, 1996   MRN:  [id number]   Chief Complaint: Weight Check (Down 9 lbs/)  HPI  Erika Solomon presents today for 8 week f/u on weight management after Medicaid dropped coverage for Wegovy . We tried to increase to Wegovy  1.7 mg but it was poorly tolerated so she just completed another month of Wegovy  1 mg a week ago. She is down 9 lb since last visit but says only 1 lb was in this last month. She would like to switch to another pharmacotherapy option, and has not tried any other medications for weight loss.   She says her ice cravings have really surged as of late. CBC and iron  panel checked 3 months ago showed low-normal iron  without anemia. She reports family history of sickle cell in her sister and thalassemia in her grandmother. To her knowledge, she has not been checked for either of these conditions before.   Medication list has been reviewed and updated.  Current Meds  Medication Sig   OVER THE COUNTER MEDICATION Iron  Bisglycinate   valACYclovir  (VALTREX ) 1000 MG tablet Take 2 tablets (2,000 mg total) by mouth 2 (two) times daily. Take 2 tablets every 12h for ONE day at the start of a cold sore, then 1 tablet every 12h for the following day, then stop.   WEGOVY  1 MG/0.5ML SOAJ SQ injection Inject 1 mg into the skin once a week.     Review of Systems  Patient Active Problem List   Diagnosis Date Noted   Metabolic syndrome 02/13/2024   Class 2 severe obesity with serious comorbidity in adult 01/13/2024   Primary hypertension 01/13/2024   History of gestational diabetes 01/13/2024   Iron  deficiency anemia 12/26/2016    Allergies  Allergen Reactions   Albuterol Anaphylaxis   Benadryl  [Diphenhydramine ] Anaphylaxis   Buspar [Buspirone] Anaphylaxis   Strawberry Extract Anaphylaxis    Immunization History  Administered Date(s) Administered   HPV 9-valent 06/08/2006, 07/02/2010, 12/30/2010   Influenza, Seasonal, Injecte,  Preservative Fre 07/30/2024   Influenza,inj,Quad PF,6+ Mos 09/30/2016   MMR 01/02/2023   Moderna Covid-19 Vaccine Bivalent Booster 8yrs & up 10/09/2020   PPD Test 11/15/2021   Tdap 10/12/2022   Varicella 01/02/2023    Past Surgical History:  Procedure Laterality Date   CESAREAN SECTION N/A 12/23/2016   Procedure: CESAREAN SECTION;  Surgeon: Glory High, MD;  Location: [facility] ORS;  Service: Obstetrics;  Laterality: N/A;   CESAREAN SECTION N/A 12/31/2022   Procedure: REPEAT CESAREAN DELIVERY;  Surgeon: Janit Alm Agent, MD;  Location: [facility] OR[facility];  Service: Obstetrics;  Laterality: N/A;   WISDOM TOOTH EXTRACTION     four; 2018    Social History   Tobacco Use   Smoking status: Never   Smokeless tobacco: Never  Vaping Use   Vaping status: Never Used  Substance Use Topics   Alcohol use: No   Drug use: No    Family History  Problem Relation Age of Onset   Diabetes Mother    Thyroid disease Mother    Epilepsy Mother    Anxiety disorder Mother    Miscarriages / Stillbirths Mother    Hypertension Father    Alcohol abuse Father    Depression Father    Sickle cell anemia Sister    Hypertension Brother    Autism spectrum disorder Brother    ADD / ADHD Brother    Asthma Brother    Intellectual disability Brother  Hypertension Maternal Grandmother    Arthritis Maternal Grandmother    Diabetes Maternal Grandfather    COPD Maternal Grandfather    Stroke Maternal Grandfather    Diabetes Paternal Grandmother    Other Paternal Grandmother        problem c kidneys and liver   Hypertension Paternal Grandfather    ADD / ADHD Son    ADD / ADHD Son    Cancer Maternal Aunt    Intellectual disability Sister         07/30/2024    8:06 AM 05/10/2024   10:57 AM 03/15/2024   10:48 AM 02/13/2024    9:20 AM  GAD 7 : Generalized Anxiety Score  Nervous, Anxious, on Edge 0 0 0 0  Control/stop worrying 0 0 0 0  Worry too much - different things 0 0 0 0  Trouble relaxing 0 0 0 0   Restless 0 0 0 0  Easily annoyed or irritable 0 1 0 0  Afraid - awful might happen 0 0  0  Total GAD 7 Score 0 1  0  Anxiety Difficulty Not difficult at all Not difficult at all Not difficult at all Not difficult at all       07/30/2024    8:06 AM 05/10/2024   10:57 AM 03/15/2024   10:48 AM  Depression screen PHQ 2/9  Decreased Interest 0 0 0  Down, Depressed, Hopeless 0 0 0  PHQ - 2 Score 0 0 0  Altered sleeping   1  Tired, decreased energy   0  Change in appetite   0  Feeling bad or failure about yourself    0  Trouble concentrating   0  Moving slowly or fidgety/restless   0  Suicidal thoughts   0  PHQ-9 Score   1  Difficult doing work/chores   Not difficult at all    BP Readings from Last 3 Encounters:  07/30/24 132/82  06/08/24 120/88  05/10/24 108/84    Wt Readings from Last 3 Encounters:  07/30/24 200 lb (90.7 kg)  06/08/24 209 lb (94.8 kg)  05/10/24 216 lb (98 kg)    BP 132/82   Pulse 98   Temp 97.8 F (36.6 C)   Ht 5' 4 (1.626 m)   Wt 200 lb (90.7 kg)   SpO2 98%   Breastfeeding No   BMI 34.33 kg/m   Physical Exam Vitals and nursing note reviewed.  Constitutional:      Appearance: Normal appearance. She is obese.  Cardiovascular:     Rate and Rhythm: Normal rate.  Pulmonary:     Effort: Pulmonary effort is normal.  Abdominal:     General: There is no distension.  Musculoskeletal:        General: Normal range of motion.  Skin:    General: Skin is warm and dry.  Neurological:     Mental Status: She is alert and oriented to person, place, and time.     Gait: Gait is intact.  Psychiatric:        Mood and Affect: Mood and affect normal.     Recent Labs     Component Value Date/Time   NA 142 01/13/2024 1050   NA 140 06/19/2014 0450   K 5.0 01/13/2024 1050   K 4.0 06/19/2014 0450   CL 107 (H) 01/13/2024 1050   CL 109 (H) 06/19/2014 0450   CO2 20 01/13/2024 1050   CO2 20 (L) 06/19/2014 0450   GLUCOSE 96  01/13/2024 1050   GLUCOSE 113  (H) 04/05/2023 1744   GLUCOSE 86 06/19/2014 0450   BUN 10 01/13/2024 1050   BUN 7 06/19/2014 0450   CREATININE 0.72 01/13/2024 1050   CREATININE 0.65 06/19/2014 0450   CALCIUM 9.5 01/13/2024 1050   CALCIUM 8.5 (L) 06/19/2014 0450   PROT 7.5 01/13/2024 1050   PROT 6.7 06/19/2014 0450   ALBUMIN 4.6 01/13/2024 1050   ALBUMIN 2.7 (L) 06/19/2014 0450   AST 22 01/13/2024 1050   AST 27 (H) 06/19/2014 0450   ALT 24 01/13/2024 1050   ALT 15 06/19/2014 0450   ALKPHOS 55 01/13/2024 1050   ALKPHOS 127 (H) 06/19/2014 0450   BILITOT 0.4 01/13/2024 1050   BILITOT 0.2 06/19/2014 0450   GFRNONAA >60 04/05/2023 1744   GFRNONAA >60 06/19/2014 0450   GFRAA 142 07/11/2019 1425   GFRAA >60 06/19/2014 0450    Lab Results  Component Value Date   WBC 6.6 05/04/2024   HGB 11.6 05/04/2024   HCT 38.1 05/04/2024   MCV 82 05/04/2024   PLT 268 05/04/2024   Lab Results  Component Value Date   HGBA1C 5.6 01/13/2024   HGBA1C 5.4 06/30/2022   Lab Results  Component Value Date   CHOL 107 01/13/2024   HDL 28 (L) 01/13/2024   LDLCALC 43 01/13/2024   TRIG 227 (H) 01/13/2024   CHOLHDL 3.8 01/13/2024   Lab Results  Component Value Date   TSH 1.800 01/13/2024      Assessment and Plan:  Class 2 severe obesity with serious comorbidity and body mass index (BMI) of 35.0 to 35.9 in adult, unspecified obesity type Assessment & Plan: Will need to pivot to alternative medication. I am curious about coverage for Qsymia, Contrave, and Saxenda.  Due to formulary restrictions, we may need to prescribe either generic phentermine, metformin, or bupropion.   Will check with our prior authorization team and see what options may be available.   In the meantime, patient advised to keep up her physical activity especially strength training which will help to keep her resting metabolic rate higher, aiding with weight maintenance.    Iron  deficiency anemia, unspecified iron  deficiency anemia type -     CBC with  Differential/Platelet -     Iron , TIBC and Ferritin Panel -     Hgb Fractionation Cascade -     Alpha-Thalassemia Analysis  Family history of sickle cell anemia -     Hgb Fractionation Cascade -     Alpha-Thalassemia Analysis  Family history of thalassemia -     Hgb Fractionation Cascade -     Alpha-Thalassemia Analysis  Encounter for immunization -     Flu vaccine trivalent PF, 6mos and older(Flulaval,Afluria,Fluarix,Fluzone)     Return in about 5 weeks (around 09/03/2024) for OV f/u weight management.    Rolan Hoyle, PA-C, DMSc, Nutritionist [facility] [phone]

## 2024-07-30 NOTE — Assessment & Plan Note (Signed)
 Will need to pivot to alternative medication. I am curious about coverage for Qsymia, Contrave, and Saxenda.  Due to formulary restrictions, we may need to prescribe either generic phentermine, metformin, or bupropion.   Will check with our prior authorization team and see what options may be available.   In the meantime, patient advised to keep up her physical activity especially strength training which will help to keep her resting metabolic rate higher, aiding with weight maintenance.

## 2024-07-31 ENCOUNTER — Other Ambulatory Visit: Payer: Self-pay | Admitting: Medical Genetics

## 2024-07-31 ENCOUNTER — Other Ambulatory Visit: Payer: Self-pay

## 2024-07-31 ENCOUNTER — Other Ambulatory Visit: Payer: Self-pay | Admitting: Physician Assistant

## 2024-07-31 DIAGNOSIS — E66812 Obesity, class 2: Secondary | ICD-10-CM

## 2024-07-31 MED ORDER — PHENTERMINE HCL 37.5 MG PO TABS
18.7500 mg | ORAL_TABLET | Freq: Every day | ORAL | 0 refills | Status: DC
  Filled 2024-07-31: qty 30, 34d supply, fill #0

## 2024-08-09 ENCOUNTER — Ambulatory Visit: Payer: Self-pay | Admitting: Physician Assistant

## 2024-08-09 ENCOUNTER — Other Ambulatory Visit: Payer: Self-pay | Admitting: Physician Assistant

## 2024-08-09 DIAGNOSIS — D509 Iron deficiency anemia, unspecified: Secondary | ICD-10-CM

## 2024-08-09 LAB — ALPHA-THALASSEMIA ANALYSIS: IMAGE: 0

## 2024-08-09 LAB — HGB FRACTIONATION CASCADE
Hgb A2: 2.1 % (ref 1.8–3.2)
Hgb A: 97.9 % (ref 96.4–98.8)
Hgb F: 0 % (ref 0.0–2.0)
Hgb S: 0 %

## 2024-08-09 LAB — CBC WITH DIFFERENTIAL/PLATELET
Basophils Absolute: 0.1 x10E3/uL (ref 0.0–0.2)
Basos: 1 %
EOS (ABSOLUTE): 0.1 x10E3/uL (ref 0.0–0.4)
Eos: 1 %
Hematocrit: 32.9 % — ABNORMAL LOW (ref 34.0–46.6)
Hemoglobin: 9.9 g/dL — ABNORMAL LOW (ref 11.1–15.9)
Immature Grans (Abs): 0 x10E3/uL (ref 0.0–0.1)
Immature Granulocytes: 0 %
Lymphocytes Absolute: 1.6 x10E3/uL (ref 0.7–3.1)
Lymphs: 22 %
MCH: 24 pg — ABNORMAL LOW (ref 26.6–33.0)
MCHC: 30.1 g/dL — ABNORMAL LOW (ref 31.5–35.7)
MCV: 80 fL (ref 79–97)
Monocytes Absolute: 0.5 x10E3/uL (ref 0.1–0.9)
Monocytes: 7 %
Neutrophils Absolute: 5.1 x10E3/uL (ref 1.4–7.0)
Neutrophils: 69 %
Platelets: 267 x10E3/uL (ref 150–450)
RBC: 4.12 x10E6/uL (ref 3.77–5.28)
RDW: 14.1 % (ref 11.7–15.4)
WBC: 7.4 x10E3/uL (ref 3.4–10.8)

## 2024-08-09 LAB — IRON,TIBC AND FERRITIN PANEL
Ferritin: 6 ng/mL — ABNORMAL LOW (ref 15–150)
Iron Saturation: 6 % — CL (ref 15–55)
Iron: 26 ug/dL — ABNORMAL LOW (ref 27–159)
Total Iron Binding Capacity: 446 ug/dL (ref 250–450)
UIBC: 420 ug/dL (ref 131–425)

## 2024-08-13 ENCOUNTER — Other Ambulatory Visit: Payer: Self-pay | Admitting: Physician Assistant

## 2024-08-13 DIAGNOSIS — D509 Iron deficiency anemia, unspecified: Secondary | ICD-10-CM

## 2024-08-13 NOTE — Telephone Encounter (Signed)
 Please review.  KP

## 2024-08-17 ENCOUNTER — Other Ambulatory Visit

## 2024-08-18 ENCOUNTER — Other Ambulatory Visit

## 2024-09-03 ENCOUNTER — Inpatient Hospital Stay

## 2024-09-03 ENCOUNTER — Ambulatory Visit: Admitting: Physician Assistant

## 2024-09-03 ENCOUNTER — Encounter: Payer: Self-pay | Admitting: Physician Assistant

## 2024-09-03 ENCOUNTER — Inpatient Hospital Stay: Attending: Oncology | Admitting: Oncology

## 2024-09-03 ENCOUNTER — Other Ambulatory Visit: Payer: Self-pay

## 2024-09-03 ENCOUNTER — Encounter: Payer: Self-pay | Admitting: Oncology

## 2024-09-03 VITALS — BP 127/78 | HR 90 | Temp 97.0°F | Resp 18

## 2024-09-03 VITALS — BP 122/78 | HR 97 | Temp 98.1°F | Ht 64.0 in | Wt 195.0 lb

## 2024-09-03 DIAGNOSIS — D509 Iron deficiency anemia, unspecified: Secondary | ICD-10-CM | POA: Diagnosis present

## 2024-09-03 DIAGNOSIS — N920 Excessive and frequent menstruation with regular cycle: Secondary | ICD-10-CM | POA: Insufficient documentation

## 2024-09-03 DIAGNOSIS — K130 Diseases of lips: Secondary | ICD-10-CM

## 2024-09-03 DIAGNOSIS — E66811 Obesity, class 1: Secondary | ICD-10-CM

## 2024-09-03 DIAGNOSIS — Z3202 Encounter for pregnancy test, result negative: Secondary | ICD-10-CM | POA: Insufficient documentation

## 2024-09-03 DIAGNOSIS — Z6833 Body mass index (BMI) 33.0-33.9, adult: Secondary | ICD-10-CM | POA: Diagnosis not present

## 2024-09-03 MED ORDER — PHENTERMINE HCL 37.5 MG PO TABS
18.7500 mg | ORAL_TABLET | Freq: Every day | ORAL | 0 refills | Status: DC
  Filled 2024-09-03: qty 30, 30d supply, fill #0

## 2024-09-03 NOTE — Assessment & Plan Note (Signed)
 Menorrhagia since menarche.  Family history of heavy menstrual bleeding. Recommend patient to follow-up with gynecology. Check von Willebrand panel at the next visit.

## 2024-09-03 NOTE — Assessment & Plan Note (Signed)
 Recommend patient to take B complex vitamin over-the-counter.

## 2024-09-03 NOTE — Progress Notes (Signed)
 Date:  09/03/2024   Name:  Erika Solomon   DOB:  01-Mar-1996   MRN:  [id number]   Chief Complaint: Weight Check (Down 5 lbs), Dry Lips (X2 weeks, nothing is helping, unchanged, lips peeling ), and Mass (Sore is in nose, X2 weeks, not going away, painful)  HPI  Erika Solomon returns for 1 month follow-up on weight management after insurance coverage necessitated switch from Wegovy  to phentermine . She has done fairly well with the medication, down an additional 5 lb since last month.   Unrelated she reports pain on the left side of the nasal passage, chapped lips, and a painful left corner of the mouth for the last 1-2 weeks. Valacyclovir  has not helped. She has been putting Aquaphor on these areas.   Medication list has been reviewed and updated.  Current Meds  Medication Sig   OVER THE COUNTER MEDICATION Iron  Bisglycinate   valACYclovir  (VALTREX ) 1000 MG tablet Take 2 tablets (2,000 mg total) by mouth 2 (two) times daily. Take 2 tablets every 12h for ONE day at the start of a cold sore, then 1 tablet every 12h for the following day, then stop.   [DISCONTINUED] phentermine  (ADIPEX-P ) 37.5 MG tablet Take 0.5-1 tablets (18.75-37.5 mg total) by mouth daily before breakfast. Take on an empty stomach with a glass of water. Start with half-tablet for at least the first 4 days. May cause dry mouth, jitteriness, or bowel changes.     Review of Systems  Patient Active Problem List   Diagnosis Date Noted   Metabolic syndrome 02/13/2024   Class 2 severe obesity with serious comorbidity in adult 01/13/2024   Primary hypertension 01/13/2024   History of gestational diabetes 01/13/2024   Iron  deficiency anemia 12/26/2016    Allergies  Allergen Reactions   Albuterol Anaphylaxis   Benadryl  [Diphenhydramine ] Anaphylaxis   Buspar [Buspirone] Anaphylaxis   Strawberry Extract Anaphylaxis    Immunization History  Administered Date(s) Administered   HPV 9-valent 06/08/2006, 07/02/2010, 12/30/2010    Influenza, Seasonal, Injecte, Preservative Fre 07/30/2024   Influenza,inj,Quad PF,6+ Mos 09/30/2016   MMR 01/02/2023   Moderna Covid-19 Vaccine Bivalent Booster 6yrs & up 10/09/2020   PPD Test 11/15/2021   Tdap 10/12/2022   Varicella 01/02/2023    Past Surgical History:  Procedure Laterality Date   CESAREAN SECTION N/A 12/23/2016   Procedure: CESAREAN SECTION;  Surgeon: Glory High, MD;  Location: [facility] ORS;  Service: Obstetrics;  Laterality: N/A;   CESAREAN SECTION N/A 12/31/2022   Procedure: REPEAT CESAREAN DELIVERY;  Surgeon: Janit Alm Agent, MD;  Location: [facility] ORS;  Service: Obstetrics;  Laterality: N/A;   WISDOM TOOTH EXTRACTION     four; 2018    Social History   Tobacco Use   Smoking status: Never   Smokeless tobacco: Never  Vaping Use   Vaping status: Never Used  Substance Use Topics   Alcohol use: No   Drug use: No    Family History  Problem Relation Age of Onset   Diabetes Mother    Thyroid disease Mother    Epilepsy Mother    Anxiety disorder Mother    Miscarriages / Stillbirths Mother    Hypertension Father    Alcohol abuse Father    Depression Father    Sickle cell anemia Sister    Hypertension Brother    Autism spectrum disorder Brother    ADD / ADHD Brother    Asthma Brother    Intellectual disability Brother    Hypertension Maternal Grandmother  Arthritis Maternal Grandmother    Diabetes Maternal Grandfather    COPD Maternal Grandfather    Stroke Maternal Grandfather    Diabetes Paternal Grandmother    Other Paternal Grandmother        problem c kidneys and liver   Hypertension Paternal Grandfather    ADD / ADHD Son    ADD / ADHD Son    Cancer Maternal Aunt    Intellectual disability Sister         07/30/2024    8:06 AM 05/10/2024   10:57 AM 03/15/2024   10:48 AM 02/13/2024    9:20 AM  GAD 7 : Generalized Anxiety Score  Nervous, Anxious, on Edge 0 0 0 0  Control/stop worrying 0 0 0 0  Worry too much - different things 0 0  0 0  Trouble relaxing 0 0 0 0  Restless 0 0 0 0  Easily annoyed or irritable 0 1 0 0  Afraid - awful might happen 0 0  0  Total GAD 7 Score 0 1  0  Anxiety Difficulty Not difficult at all Not difficult at all Not difficult at all Not difficult at all       07/30/2024    8:06 AM 05/10/2024   10:57 AM 03/15/2024   10:48 AM  Depression screen PHQ 2/9  Decreased Interest 0 0 0  Down, Depressed, Hopeless 0 0 0  PHQ - 2 Score 0 0 0  Altered sleeping   1  Tired, decreased energy   0  Change in appetite   0  Feeling bad or failure about yourself    0  Trouble concentrating   0  Moving slowly or fidgety/restless   0  Suicidal thoughts   0  PHQ-9 Score   1   Difficult doing work/chores   Not difficult at all     Data saved with a previous flowsheet row definition    BP Readings from Last 3 Encounters:  09/03/24 122/78  07/30/24 132/82  06/08/24 120/88    Wt Readings from Last 3 Encounters:  09/03/24 195 lb (88.5 kg)  07/30/24 200 lb (90.7 kg)  06/08/24 209 lb (94.8 kg)    BP 122/78   Pulse 97   Temp 98.1 F (36.7 C)   Ht 5' 4 (1.626 m)   Wt 195 lb (88.5 kg)   SpO2 99%   Breastfeeding Yes   BMI 33.47 kg/m   Physical Exam Vitals and nursing note reviewed.  Constitutional:      Appearance: Normal appearance.  HENT:     Nose:     Comments: Left posterior intranasal fissure with slight crusting/scabbing.    Mouth/Throat:     Comments: Mild left angular cheilitis Cardiovascular:     Rate and Rhythm: Normal rate.  Pulmonary:     Effort: Pulmonary effort is normal.  Abdominal:     General: There is no distension.  Musculoskeletal:        General: Normal range of motion.  Skin:    General: Skin is warm and dry.  Neurological:     Mental Status: She is alert and oriented to person, place, and time.     Gait: Gait is intact.  Psychiatric:        Mood and Affect: Mood and affect normal.     Recent Labs     Component Value Date/Time   NA 142 01/13/2024 1050    NA 140 06/19/2014 0450   K 5.0 01/13/2024 1050  K 4.0 06/19/2014 0450   CL 107 (H) 01/13/2024 1050   CL 109 (H) 06/19/2014 0450   CO2 20 01/13/2024 1050   CO2 20 (L) 06/19/2014 0450   GLUCOSE 96 01/13/2024 1050   GLUCOSE 113 (H) 04/05/2023 1744   GLUCOSE 86 06/19/2014 0450   BUN 10 01/13/2024 1050   BUN 7 06/19/2014 0450   CREATININE 0.72 01/13/2024 1050   CREATININE 0.65 06/19/2014 0450   CALCIUM 9.5 01/13/2024 1050   CALCIUM 8.5 (L) 06/19/2014 0450   PROT 7.5 01/13/2024 1050   PROT 6.7 06/19/2014 0450   ALBUMIN 4.6 01/13/2024 1050   ALBUMIN 2.7 (L) 06/19/2014 0450   AST 22 01/13/2024 1050   AST 27 (H) 06/19/2014 0450   ALT 24 01/13/2024 1050   ALT 15 06/19/2014 0450   ALKPHOS 55 01/13/2024 1050   ALKPHOS 127 (H) 06/19/2014 0450   BILITOT 0.4 01/13/2024 1050   BILITOT 0.2 06/19/2014 0450   GFRNONAA >60 04/05/2023 1744   GFRNONAA >60 06/19/2014 0450   GFRAA 142 07/11/2019 1425   GFRAA >60 06/19/2014 0450    Lab Results  Component Value Date   WBC 7.4 07/30/2024   HGB 9.9 (L) 07/30/2024   HCT 32.9 (L) 07/30/2024   MCV 80 07/30/2024   PLT 267 07/30/2024   Lab Results  Component Value Date   HGBA1C 5.6 01/13/2024   HGBA1C 5.4 06/30/2022   Lab Results  Component Value Date   CHOL 107 01/13/2024   HDL 28 (L) 01/13/2024   LDLCALC 43 01/13/2024   TRIG 227 (H) 01/13/2024   CHOLHDL 3.8 01/13/2024   Lab Results  Component Value Date   TSH 1.800 01/13/2024      Assessment and Plan:  1. Class 1 obesity with serious comorbidity and body mass index (BMI) of 33.0 to 33.9 in adult, unspecified obesity type (Primary) Continue phentermine  as prescribed, follow-up in 1 month. - phentermine  (ADIPEX-P ) 37.5 MG tablet; Take 0.5-1 tablets (18.75-37.5 mg total) by mouth daily before breakfast. Take on an empty stomach with a glass of water. Start with half-tablet for at least the first 4 days. May cause dry mouth, jitteriness, or bowel changes.  Dispense: 30 tablet;  Refill: 0  2. Angular cheilitis Known iron  deficiency. Seeing hematology today and GI next week. Also encouraged to seek GYN consult, as she reports heavy menses but only for a few days. In the meantime, for the angular cheilitis and intranasal fissure, she should continue use of Aquaphor and focus on adequate hydration and use of humidifier in the home.  Advised to try to avoid dry heat wherever possible.     F/u 52m OV wgt mgmt   Rolan Hoyle, PA-C, DMSc, Nutritionist [facility] [phone]

## 2024-09-03 NOTE — Assessment & Plan Note (Addendum)
 Lab Results  Component Value Date   HGB 9.9 (L) 07/30/2024   TIBC 446 07/30/2024   IRONPCTSAT 6 (LL) 07/30/2024   FERRITIN 6 (L) 07/30/2024    Lab results reviewed with patient.  Consistent with iron  deficiency anemia despite oral iron  supplementation.  I discussed about option of proceed with IV Venofer  treatments. patient is interested in IV Venofer  treatments.  She has previously tolerated IV Venofer  treatments as well as IV Feraheme treatments.  Plan IV venofer  weekly x 5

## 2024-09-03 NOTE — Progress Notes (Signed)
 Hematology/Oncology Consult note [phone] [phone]        REFERRING PROVIDER: Manya Toribio SQUIBB, PA   CHIEF COMPLAINTS/REASON FOR VISIT:  Evaluation of iron  deficiency anemia   ASSESSMENT & PLAN:   Iron  deficiency anemia Lab Results  Component Value Date   HGB 9.9 (L) 07/30/2024   TIBC 446 07/30/2024   IRONPCTSAT 6 (LL) 07/30/2024   FERRITIN 6 (L) 07/30/2024    Lab results reviewed with patient.  Consistent with iron  deficiency anemia despite oral iron  supplementation.  I discussed about option of proceed with IV Venofer  treatments. patient is interested in IV Venofer  treatments.  She has previously tolerated IV Venofer  treatments as well as IV Feraheme treatments.  Plan IV venofer  weekly x 5  Angular cheilitis Recommend patient to take B complex vitamin over-the-counter.  Menorrhagia Menorrhagia since menarche.  Family history of heavy menstrual bleeding. Recommend patient to follow-up with gynecology. Check von Willebrand panel at the next visit.   Orders Placed This Encounter  Procedures   Von Willebrand panel    Standing Status:   Future    Expected Date:   12/02/2024    Expiration Date:   09/03/2025   CBC with Differential (Cancer Center Only)    Standing Status:   Future    Expected Date:   12/02/2024    Expiration Date:   03/02/2025   Iron  and TIBC    Standing Status:   Future    Expected Date:   12/02/2024    Expiration Date:   03/02/2025   Ferritin    Standing Status:   Future    Expected Date:   12/02/2024    Expiration Date:   03/02/2025   Retic Panel    Standing Status:   Future    Expected Date:   12/02/2024    Expiration Date:   03/02/2025   Celiac panel 10    Standing Status:   Future    Expected Date:   12/02/2024    Expiration Date:   03/02/2025   Pregnancy, urine    Standing Status:   Standing    Number of Occurrences:   4    Expiration Date:   03/04/2025   Follow-up in 3 months All questions were answered. The patient  knows to call the clinic with any problems, questions or concerns.  Zelphia Cap, MD, PhD [facility] Oncology 09/03/2024   HISTORY OF PRESENTING ILLNESS:   Erika Solomon is a  28 y.o.  female with PMH listed below was seen in consultation at the request of  Manya Toribio SQUIBB, PA  for evaluation of iron  deficiency anemia  Discussed the use of AI scribe software for clinical note transcription with the patient, who gave verbal consent to proceed.    She has a history of chronic iron  deficiency anemia, first identified during her first pregnancy in 76. Her hemoglobin level was 9.9 g/dL as of July 30, 2024. She has experienced chronic anemia since at least 2015, with intermittent improvement in 2019 and 2020, but a decline in 2023 and 2024. She has been taking iron  supplements since April 2025, but they have not been effective. Previous treatments include blood transfusions and iron  infusions, such as Venofer , which provided temporary improvement.  She reports heavy menstrual periods, which have improved since she began losing weight in April 2025. Her periods are now heavy for the first two days and then become light.  She started menstruating at age 14 and recalls her periods being  heavy from the onset. No major abdominal surgeries other than C-sections and no known history of celiac disease or fibroids.  She has had four pregnancies, with her youngest child being almost two years old. During her pregnancies, she required iron  transfusions. There is a family history of blood and iron  issues among the women in her family.  She experiences cravings for ice. She denies taking ibuprofen  regularly and reports no significant alcohol use. She mentions a recent issue with the corners of her mouth splitting. No rectal bleeding and denies any significant acid reflux. She reports frequent gum bleeding and occasional epistaxis.  MEDICAL HISTORY:  Past Medical History:  Diagnosis Date    Allergy    Anemia    Anxiety and depression    Asthma    Blood transfusion without reported diagnosis    Gestational diabetes    Hx of preeclampsia, prior pregnancy, currently pregnant    Hypertension    Macrosomia    Panic disorder    Postpartum care following cesarean delivery 12/26/2016   Shoulder dystocia, delivered     SURGICAL HISTORY: Past Surgical History:  Procedure Laterality Date   CESAREAN SECTION N/A 12/23/2016   Procedure: CESAREAN SECTION;  Surgeon: Glory High, MD;  Location: [facility] ORS;  Service: Obstetrics;  Laterality: N/A;   CESAREAN SECTION N/A 12/31/2022   Procedure: REPEAT CESAREAN DELIVERY;  Surgeon: Janit Alm Agent, MD;  Location: [facility] ORS;  Service: Obstetrics;  Laterality: N/A;   WISDOM TOOTH EXTRACTION     four; 2018    SOCIAL HISTORY: Social History   Socioeconomic History   Marital status: Significant Other    Spouse name: Franky Beam   Number of children: 4   Years of education: 13.5   Highest education level: Associate degree: academic program  Occupational History   Occupation: lawyer  Tobacco Use   Smoking status: Never   Smokeless tobacco: Never  Vaping Use   Vaping status: Never Used  Substance and Sexual Activity   Alcohol use: No   Drug use: No   Sexual activity: Yes    Partners: Male    Birth control/protection: None  Other Topics Concern   Not on file  Social History Narrative   Not on file   Social Drivers of Health   Financial Resource Strain: Low Risk  (07/29/2024)   Overall Financial Resource Strain (CARDIA)    Difficulty of Paying Living Expenses: Not hard at all  Food Insecurity: Patient Declined (08/31/2024)   Hunger Vital Sign    Worried About Running Out of Food in the Last Year: Patient declined    Ran Out of Food in the Last Year: Patient declined  Transportation Needs: Patient Declined (08/31/2024)   PRAPARE - Administrator, Civil Service (Medical): Patient declined    Lack  of Transportation (Non-Medical): Patient declined  Physical Activity: Insufficiently Active (07/29/2024)   Exercise Vital Sign    Days of Exercise per Week: 3 days    Minutes of Exercise per Session: 30 min  Stress: Stress Concern Present (07/29/2024)   [employer] of Occupational Health - Occupational Stress Questionnaire    Feeling of Stress: To some extent  Social Connections: Socially Integrated (07/29/2024)   Social Connection and Isolation Panel    Frequency of Communication with Friends and Family: Twice a week    Frequency of Social Gatherings with Friends and Family: Once a week    Attends Religious Services: More than 4 times per year    Active  Member of Clubs or Organizations: Yes    Attends Engineer, Structural: More than 4 times per year    Marital Status: Married  Catering Manager Violence: Not At Risk (01/13/2024)   Humiliation, Afraid, Rape, and Kick questionnaire    Fear of Current or Ex-Partner: No    Emotionally Abused: No    Physically Abused: No    Sexually Abused: No    FAMILY HISTORY: Family History  Problem Relation Age of Onset   Diabetes Mother    Thyroid disease Mother    Epilepsy Mother    Anxiety disorder Mother    Miscarriages / Stillbirths Mother    Hypertension Father    Alcohol abuse Father    Depression Father    Sickle cell anemia Sister    Hypertension Brother    Autism spectrum disorder Brother    ADD / ADHD Brother    Asthma Brother    Intellectual disability Brother    Hypertension Maternal Grandmother    Arthritis Maternal Grandmother    Diabetes Maternal Grandfather    COPD Maternal Grandfather    Stroke Maternal Grandfather    Diabetes Paternal Grandmother    Other Paternal Grandmother        problem c kidneys and liver   Hypertension Paternal Grandfather    ADD / ADHD Son    ADD / ADHD Son    Cancer Maternal Aunt    Intellectual disability Sister     ALLERGIES:  is allergic to albuterol, benadryl   [diphenhydramine ], and strawberry extract.  MEDICATIONS:  Current Outpatient Medications  Medication Sig Dispense Refill   OVER THE COUNTER MEDICATION Iron  Bisglycinate     phentermine  (ADIPEX-P ) 37.5 MG tablet Take 0.5-1 tablets (18.75-37.5 mg total) by mouth daily before breakfast. Take on an empty stomach with a glass of water. Start with half-tablet for at least the first 4 days. May cause dry mouth, jitteriness, or bowel changes. 30 tablet 0   valACYclovir  (VALTREX ) 1000 MG tablet Take 2 tablets (2,000 mg total) by mouth 2 (two) times daily. Take 2 tablets every 12h for ONE day at the start of a cold sore, then 1 tablet every 12h for the following day, then stop. 30 tablet 1   No current facility-administered medications for this visit.    Review of Systems  Constitutional:  Positive for fatigue. Negative for appetite change, chills and fever.  HENT:   Negative for hearing loss and voice change.   Eyes:  Negative for eye problems.  Respiratory:  Negative for chest tightness and cough.   Cardiovascular:  Negative for chest pain.  Gastrointestinal:  Negative for abdominal distention, abdominal pain and blood in stool.  Endocrine: Negative for hot flashes.  Genitourinary:  Positive for menstrual problem. Negative for difficulty urinating and frequency.   Musculoskeletal:  Negative for arthralgias.  Skin:  Negative for itching and rash.  Neurological:  Negative for extremity weakness.  Hematological:  Negative for adenopathy.  Psychiatric/Behavioral:  Negative for confusion.    PHYSICAL EXAMINATION:  Vitals:   09/03/24 1510  BP: 127/78  Pulse: 90  Resp: 18  Temp: (!) 97 F (36.1 C)  SpO2: 100%   There were no vitals filed for this visit.  Physical Exam Constitutional:      General: She is not in acute distress. HENT:     Head: Normocephalic and atraumatic.  Eyes:     General: No scleral icterus. Cardiovascular:     Rate and Rhythm: Normal rate and regular rhythm.  Pulmonary:     Effort: Pulmonary effort is normal. No respiratory distress.     Breath sounds: Normal breath sounds. No wheezing.  Abdominal:     General: Bowel sounds are normal. There is no distension.     Palpations: Abdomen is soft.  Musculoskeletal:        General: No deformity. Normal range of motion.     Cervical back: Normal range of motion and neck supple.  Skin:    General: Skin is warm and dry.     Findings: No erythema or rash.     Comments: Left angular cheilitis  Neurological:     Mental Status: She is alert and oriented to person, place, and time. Mental status is at baseline.  Psychiatric:        Mood and Affect: Mood normal.     LABORATORY DATA:  I have reviewed the data as listed    Latest Ref Rng & Units 07/30/2024    8:48 AM 05/04/2024    2:02 PM 03/15/2024   11:09 AM  CBC  WBC 3.4 - 10.8 x10E3/uL 7.4  6.6  6.5   Hemoglobin 11.1 - 15.9 g/dL 9.9  88.3  8.1   Hematocrit 34.0 - 46.6 % 32.9  38.1  29.6   Platelets 150 - 450 x10E3/uL 267  268  292       Latest Ref Rng & Units 01/13/2024   10:50 AM 04/05/2023    5:44 PM 12/15/2022    1:21 PM  CMP  Glucose 70 - 99 mg/dL 96  886  76   BUN 6 - 20 mg/dL 10  11  5    Creatinine 0.57 - 1.00 mg/dL 9.27  9.12  9.51   Sodium 134 - 144 mmol/L 142  134  135   Potassium 3.5 - 5.2 mmol/L 5.0  3.4  3.8   Chloride 96 - 106 mmol/L 107  101  108   CO2 20 - 29 mmol/L 20  22  22    Calcium 8.7 - 10.2 mg/dL 9.5  8.7  9.2   Total Protein 6.0 - 8.5 g/dL 7.5  8.1  6.7   Total Bilirubin 0.0 - 1.2 mg/dL 0.4  1.0  0.5   Alkaline Phos 44 - 121 IU/L 55  69  68   AST 0 - 40 IU/L 22  59  16   ALT 0 - 32 IU/L 24  72  11       RADIOGRAPHIC STUDIES: I have personally reviewed the radiological images as listed and agreed with the findings in the report. No results found.

## 2024-09-03 NOTE — Addendum Note (Signed)
 Addended by: BABARA CALL on: 09/03/2024 09:23 PM   Modules accepted: Orders

## 2024-09-14 ENCOUNTER — Inpatient Hospital Stay

## 2024-09-14 VITALS — BP 126/81 | HR 91 | Temp 98.9°F | Resp 18

## 2024-09-14 DIAGNOSIS — D509 Iron deficiency anemia, unspecified: Secondary | ICD-10-CM | POA: Diagnosis not present

## 2024-09-14 LAB — PREGNANCY, URINE: Preg Test, Ur: NEGATIVE

## 2024-09-14 MED ORDER — IRON SUCROSE 20 MG/ML IV SOLN
200.0000 mg | Freq: Once | INTRAVENOUS | Status: AC
  Administered 2024-09-14: 200 mg via INTRAVENOUS
  Filled 2024-09-14: qty 10

## 2024-09-14 NOTE — Patient Instructions (Signed)

## 2024-09-18 ENCOUNTER — Inpatient Hospital Stay

## 2024-09-19 ENCOUNTER — Inpatient Hospital Stay

## 2024-09-19 VITALS — BP 129/78 | HR 85 | Temp 97.9°F | Resp 17

## 2024-09-19 DIAGNOSIS — D509 Iron deficiency anemia, unspecified: Secondary | ICD-10-CM

## 2024-09-19 LAB — PREGNANCY, URINE: Preg Test, Ur: NEGATIVE

## 2024-09-19 MED ORDER — SODIUM CHLORIDE 0.9% FLUSH
10.0000 mL | Freq: Once | INTRAVENOUS | Status: AC | PRN
  Administered 2024-09-19: 16:00:00 10 mL
  Filled 2024-09-19: qty 10

## 2024-09-19 MED ORDER — IRON SUCROSE 20 MG/ML IV SOLN
200.0000 mg | Freq: Once | INTRAVENOUS | Status: AC
  Administered 2024-09-19: 16:00:00 200 mg via INTRAVENOUS
  Filled 2024-09-19: qty 10

## 2024-09-24 ENCOUNTER — Inpatient Hospital Stay

## 2024-10-01 ENCOUNTER — Inpatient Hospital Stay

## 2024-10-01 NOTE — Telephone Encounter (Signed)
 Please review.  KP

## 2024-10-02 ENCOUNTER — Inpatient Hospital Stay

## 2024-10-02 ENCOUNTER — Ambulatory Visit: Admitting: Physician Assistant

## 2024-10-02 VITALS — BP 127/80 | HR 80 | Temp 98.0°F | Resp 16

## 2024-10-02 DIAGNOSIS — D509 Iron deficiency anemia, unspecified: Secondary | ICD-10-CM

## 2024-10-02 MED ORDER — SODIUM CHLORIDE 0.9% FLUSH
10.0000 mL | Freq: Once | INTRAVENOUS | Status: AC | PRN
  Administered 2024-10-02: 10 mL
  Filled 2024-10-02: qty 10

## 2024-10-02 MED ORDER — IRON SUCROSE 20 MG/ML IV SOLN
200.0000 mg | Freq: Once | INTRAVENOUS | Status: AC
  Administered 2024-10-02: 200 mg via INTRAVENOUS

## 2024-10-02 NOTE — Progress Notes (Signed)
 Patient declined the urine pregnancy test today and stated that she is on her period. Dr. Babara aware.

## 2024-10-05 ENCOUNTER — Telehealth: Payer: Self-pay | Admitting: Physician Assistant

## 2024-10-05 ENCOUNTER — Encounter: Payer: Self-pay | Admitting: Physician Assistant

## 2024-10-05 ENCOUNTER — Ambulatory Visit: Admitting: Physician Assistant

## 2024-10-05 ENCOUNTER — Telehealth: Payer: Self-pay

## 2024-10-05 ENCOUNTER — Other Ambulatory Visit (HOSPITAL_COMMUNITY): Payer: Self-pay

## 2024-10-05 VITALS — BP 128/86 | HR 98 | Temp 97.8°F | Ht 64.0 in | Wt 195.0 lb

## 2024-10-05 DIAGNOSIS — Z6833 Body mass index (BMI) 33.0-33.9, adult: Secondary | ICD-10-CM

## 2024-10-05 DIAGNOSIS — K130 Diseases of lips: Secondary | ICD-10-CM

## 2024-10-05 DIAGNOSIS — D509 Iron deficiency anemia, unspecified: Secondary | ICD-10-CM

## 2024-10-05 DIAGNOSIS — E66811 Obesity, class 1: Secondary | ICD-10-CM | POA: Diagnosis not present

## 2024-10-05 NOTE — Telephone Encounter (Signed)
 Pharmacy Patient Advocate Encounter   Received notification from Physician's Office that prior authorization for Wegovy  0.25 mg/0.5 ml pen is required/requested.   Insurance verification completed.   The patient is insured through [employer].   Per test claim: PA required; PA started via CoverMyMeds. KEY [id number] . Waiting for clinical questions to populate.

## 2024-10-05 NOTE — Progress Notes (Signed)
 "   Date:  10/05/2024   Name:  Erika Solomon   DOB:  09/24/1996   MRN:  [id number]   Chief Complaint: Weight Check  HPI  Chalsea returns today for 46-month f/u on weight management having just completed her second month of phentermine . Weight has been stable this month despite an initial loss of 5 lbs last month.   Since her last visit, she completed IV iron  infusions through hematology 12/12, 12/17, and 12/30 for iron  deficiency anemia thought to be related to menorrhagia.  She has been having numerous side effects after her infusions.  She also mentions in a message to me 10/02/24 that she has been struggling with extremely dry lips and angular cheilitis in the left corner of my mouth, despite using various moisturizers such as chapstick, Vaseline, Aquaphor, and a humidifier, with no improvement. My facial skin has also become dry, itchy, and prone to breakouts, prompting me to start using CeraVe Itch Relief Moisturizing Lotion.  Hematology has placed orders for CBC, iron  panel, reticulocytes, celiac screen, von Willebrand screen, although she tells me these tests will not be completed until March.   Medication list has been reviewed and updated.  Active Medications[1]   Review of Systems  Patient Active Problem List   Diagnosis Date Noted   Angular cheilitis 09/03/2024   Menorrhagia 09/03/2024   Metabolic syndrome 02/13/2024   Class 2 severe obesity with serious comorbidity in adult 01/13/2024   Primary hypertension 01/13/2024   History of gestational diabetes 01/13/2024   Iron  deficiency anemia 12/26/2016    Allergies[2]  Immunization History  Administered Date(s) Administered   HPV 9-valent 06/08/2006, 07/02/2010, 12/30/2010   Influenza, Seasonal, Injecte, Preservative Fre 07/30/2024   Influenza,inj,Quad PF,6+ Mos 09/30/2016   MMR 01/02/2023   Moderna Covid-19 Vaccine Bivalent Booster 42yrs & up 10/09/2020   PPD Test 11/15/2021   Tdap 10/12/2022   Varicella  01/02/2023    Past Surgical History:  Procedure Laterality Date   CESAREAN SECTION N/A 12/23/2016   Procedure: CESAREAN SECTION;  Surgeon: Glory High, MD;  Location: [facility] ORS;  Service: Obstetrics;  Laterality: N/A;   CESAREAN SECTION N/A 12/31/2022   Procedure: REPEAT CESAREAN DELIVERY;  Surgeon: Janit Alm Agent, MD;  Location: [facility] ORS;  Service: Obstetrics;  Laterality: N/A;   WISDOM TOOTH EXTRACTION     four; 2018    Social History[3]  Family History  Problem Relation Age of Onset   Diabetes Mother    Thyroid disease Mother    Epilepsy Mother    Anxiety disorder Mother    Miscarriages / Stillbirths Mother    Hypertension Father    Alcohol abuse Father    Depression Father    Sickle cell anemia Sister    Hypertension Brother    Autism spectrum disorder Brother    ADD / ADHD Brother    Asthma Brother    Intellectual disability Brother    Hypertension Maternal Grandmother    Arthritis Maternal Grandmother    Diabetes Maternal Grandfather    COPD Maternal Grandfather    Stroke Maternal Grandfather    Diabetes Paternal Grandmother    Other Paternal Grandmother        problem c kidneys and liver   Hypertension Paternal Grandfather    ADD / ADHD Son    ADD / ADHD Son    Cancer Maternal Aunt    Intellectual disability Sister         10/05/2024    2:10 PM 07/30/2024  8:06 AM 05/10/2024   10:57 AM 03/15/2024   10:48 AM  GAD 7 : Generalized Anxiety Score  Nervous, Anxious, on Edge 0 0 0 0  Control/stop worrying 0 0 0 0  Worry too much - different things 0 0 0 0  Trouble relaxing 0 0 0 0  Restless 0 0 0 0  Easily annoyed or irritable 0 0 1 0  Afraid - awful might happen 0 0 0   Total GAD 7 Score 0 0 1   Anxiety Difficulty Not difficult at all Not difficult at all Not difficult at all Not difficult at all       10/05/2024    2:10 PM 09/04/2024    3:55 PM 07/30/2024    8:06 AM  Depression screen PHQ 2/9  Decreased Interest 1 1 0  Down, Depressed,  Hopeless 0 0 0  PHQ - 2 Score 1 1 0    BP Readings from Last 3 Encounters:  10/05/24 128/86  10/02/24 127/80  09/19/24 129/78    Wt Readings from Last 3 Encounters:  10/05/24 195 lb (88.5 kg)  09/03/24 195 lb (88.5 kg)  07/30/24 200 lb (90.7 kg)    BP 128/86   Pulse 98   Temp 97.8 F (36.6 C)   Ht 5' 4 (1.626 m)   Wt 195 lb (88.5 kg)   LMP 10/02/2024 (Exact Date)   SpO2 99%   BMI 33.47 kg/m   Physical Exam Vitals and nursing note reviewed.  Constitutional:      Appearance: Normal appearance. She is obese.  Cardiovascular:     Rate and Rhythm: Normal rate.  Pulmonary:     Effort: Pulmonary effort is normal.  Abdominal:     General: There is no distension.  Musculoskeletal:        General: Normal range of motion.  Skin:    General: Skin is warm and dry.  Neurological:     Mental Status: She is alert and oriented to person, place, and time.     Gait: Gait is intact.  Psychiatric:        Mood and Affect: Mood and affect normal.     Recent Labs     Component Value Date/Time   NA 142 01/13/2024 1050   NA 140 06/19/2014 0450   K 5.0 01/13/2024 1050   K 4.0 06/19/2014 0450   CL 107 (H) 01/13/2024 1050   CL 109 (H) 06/19/2014 0450   CO2 20 01/13/2024 1050   CO2 20 (L) 06/19/2014 0450   GLUCOSE 96 01/13/2024 1050   GLUCOSE 113 (H) 04/05/2023 1744   GLUCOSE 86 06/19/2014 0450   BUN 10 01/13/2024 1050   BUN 7 06/19/2014 0450   CREATININE 0.72 01/13/2024 1050   CREATININE 0.65 06/19/2014 0450   CALCIUM 9.5 01/13/2024 1050   CALCIUM 8.5 (L) 06/19/2014 0450   PROT 7.5 01/13/2024 1050   PROT 6.7 06/19/2014 0450   ALBUMIN 4.6 01/13/2024 1050   ALBUMIN 2.7 (L) 06/19/2014 0450   AST 22 01/13/2024 1050   AST 27 (H) 06/19/2014 0450   ALT 24 01/13/2024 1050   ALT 15 06/19/2014 0450   ALKPHOS 55 01/13/2024 1050   ALKPHOS 127 (H) 06/19/2014 0450   BILITOT 0.4 01/13/2024 1050   BILITOT 0.2 06/19/2014 0450   GFRNONAA >60 04/05/2023 1744   GFRNONAA >60  06/19/2014 0450   GFRAA 142 07/11/2019 1425   GFRAA >60 06/19/2014 0450    Lab Results  Component Value Date  WBC 7.4 07/30/2024   HGB 9.6 (L) 10/05/2024   HCT 30.7 (L) 10/05/2024   MCV 80 07/30/2024   PLT 267 07/30/2024   Lab Results  Component Value Date   HGBA1C 5.6 01/13/2024   HGBA1C 5.4 06/30/2022   Lab Results  Component Value Date   CHOL 107 01/13/2024   HDL 28 (L) 01/13/2024   LDLCALC 43 01/13/2024   TRIG 227 (H) 01/13/2024   CHOLHDL 3.8 01/13/2024   Lab Results  Component Value Date   TSH 1.800 01/13/2024      Assessment and Plan:  1. Class 1 obesity with serious comorbidity and body mass index (BMI) of 33.0 to 33.9 in adult, unspecified obesity type (Primary) Will stop phentermine  via taper (reviewed) due to suboptimal response and possible contribution to her skin and lips dryness as side effect. Will instead try to restart Wegovy  given recent change to again include this on Medicaid formulary but a new prior auth is needed.   2. Angular cheilitis Continue moisturizing methods, stop phentermine . Screen for celiac  3. Iron  deficiency anemia, unspecified iron  deficiency anemia type Will go ahead and screen for celiac disease given her apparently resistant IDA and skin/mucus membrane manifestations.   - IgA - Tissue transglutaminase, IgA - Hemoglobin and hematocrit, blood    F/u TBD pending next steps with Wegovy  or other weight management options.    Rolan Hoyle, PA-C, DMSc, DipACLM, Nutritionist [facility] [phone]      [1]  Current Meds  Medication Sig   OVER THE COUNTER MEDICATION Iron  Bisglycinate   phentermine  (ADIPEX-P ) 37.5 MG tablet Take 0.5-1 tablets (18.75-37.5 mg total) by mouth daily before breakfast. Take on an empty stomach with a glass of water. Start with half-tablet for at least the first 4 days. May cause dry mouth, jitteriness, or bowel  changes.   valACYclovir  (VALTREX ) 1000 MG tablet Take 2 tablets (2,000 mg total) by mouth 2 (two) times daily. Take 2 tablets every 12h for ONE day at the start of a cold sore, then 1 tablet every 12h for the following day, then stop.  [2]  Allergies Allergen Reactions   Albuterol Anaphylaxis   Benadryl  [Diphenhydramine ] Anaphylaxis   Strawberry Extract Anaphylaxis  [3]  Social History Tobacco Use   Smoking status: Never   Smokeless tobacco: Never  Vaping Use   Vaping status: Never Used  Substance Use Topics   Alcohol use: No   Drug use: No   "

## 2024-10-05 NOTE — Telephone Encounter (Signed)
 Please check prior auth for Wegovy  for obesity with comorbidity of HTN and metabolic syndrome. She was on this before the formulary change in Oct, but now the formulary changed again and it should be approved. Original dx was class 2 severe/morbid obesity with serious comorbidity.

## 2024-10-06 LAB — TISSUE TRANSGLUTAMINASE, IGA: t-Transglutaminase (tTG) IgA: <2 U/mL (ref 0–3)

## 2024-10-06 LAB — HEMOGLOBIN AND HEMATOCRIT, BLOOD
Hematocrit: 30.7 % — ABNORMAL LOW (ref 34.0–46.6)
Hemoglobin: 9.6 g/dL — ABNORMAL LOW (ref 11.1–15.9)

## 2024-10-06 LAB — IGA: Immunoglobulin A, (IgA) QN, Serum: 209 mg/dL (ref 87–352)

## 2024-10-07 ENCOUNTER — Ambulatory Visit: Payer: Self-pay | Admitting: Physician Assistant

## 2024-10-09 ENCOUNTER — Other Ambulatory Visit: Payer: Self-pay

## 2024-10-09 ENCOUNTER — Inpatient Hospital Stay: Attending: Oncology

## 2024-10-09 ENCOUNTER — Inpatient Hospital Stay: Admitting: Oncology

## 2024-10-09 ENCOUNTER — Encounter: Payer: Self-pay | Admitting: Oncology

## 2024-10-09 ENCOUNTER — Inpatient Hospital Stay

## 2024-10-09 VITALS — BP 128/82 | HR 72 | Temp 97.0°F | Resp 18

## 2024-10-09 DIAGNOSIS — L739 Follicular disorder, unspecified: Secondary | ICD-10-CM

## 2024-10-09 DIAGNOSIS — L02429 Furuncle of limb, unspecified: Secondary | ICD-10-CM | POA: Insufficient documentation

## 2024-10-09 DIAGNOSIS — L02422 Furuncle of left axilla: Secondary | ICD-10-CM | POA: Diagnosis not present

## 2024-10-09 DIAGNOSIS — D509 Iron deficiency anemia, unspecified: Secondary | ICD-10-CM

## 2024-10-09 LAB — PREGNANCY, URINE: Preg Test, Ur: NEGATIVE

## 2024-10-09 MED ORDER — IRON SUCROSE 20 MG/ML IV SOLN
200.0000 mg | Freq: Once | INTRAVENOUS | Status: AC
  Administered 2024-10-09: 200 mg via INTRAVENOUS
  Filled 2024-10-09: qty 10

## 2024-10-09 MED ORDER — AMOXICILLIN-POT CLAVULANATE 875-125 MG PO TABS
1.0000 | ORAL_TABLET | Freq: Two times a day (BID) | ORAL | 0 refills | Status: AC
  Filled 2024-10-09: qty 10, 5d supply, fill #0

## 2024-10-09 MED ORDER — CLOTRIMAZOLE 1 % VA CREA
1.0000 | TOPICAL_CREAM | Freq: Every day | VAGINAL | 0 refills | Status: AC
  Filled 2024-10-09: qty 45, 10d supply, fill #0

## 2024-10-09 MED ORDER — SODIUM CHLORIDE 0.9% FLUSH
10.0000 mL | Freq: Once | INTRAVENOUS | Status: AC | PRN
  Administered 2024-10-09: 10 mL
  Filled 2024-10-09: qty 10

## 2024-10-09 NOTE — Assessment & Plan Note (Signed)
 Recommend a 5 days course of Augmentin  BID.  Patient requests coverage for vaginal yeast infection which she always gets after taking antibiotics. Recommend topical vaginal lotrimin  cream.  Patient knows to call clinic if symptoms do not improve or if nodule persists.

## 2024-10-09 NOTE — Telephone Encounter (Signed)
 Pharmacy Patient Advocate Encounter  Received notification from [employer] that Prior Authorization for Wegovy  0.25 mg/0.5 ml pen has been DENIED.  Full denial letter will be uploaded to the media tab. See denial reason below.  Wegovy  is not covered under the member's benefit plan and is excluded from formulary.   PA #/Case ID/Reference #: [id number]

## 2024-10-09 NOTE — Progress Notes (Signed)
 " Hematology/Oncology Progress note [phone] [phone]           REFERRING PROVIDER: Manya Toribio SQUIBB, PA   CHIEF COMPLAINTS/REASON FOR VISIT:  Evaluation of iron  deficiency anemia   ASSESSMENT & PLAN:   Furuncle of axilla Recommend a 5 days course of Augmentin  BID.  Patient requests coverage for vaginal yeast infection which she always gets after taking antibiotics. Recommend topical vaginal lotrimin  cream.  Patient knows to call clinic if symptoms do not improve or if nodule persists.   Iron  deficiency anemia Lab Results  Component Value Date   HGB 9.6 (L) 10/05/2024   TIBC 446 07/30/2024   IRONPCTSAT 6 (LL) 07/30/2024   FERRITIN 6 (L) 07/30/2024    S/p weekly Venofer  x 4. Interval H&H after 3 treatments did not show any improvement.  Will repeat another cbc in 3-4 weeks, check VWD, PT/PTT.    Orders Placed This Encounter  Procedures   Retic Panel    Standing Status:   Future    Expected Date:   10/30/2024    Expiration Date:   01/28/2025   CBC with Differential (Cancer Center Only)    Standing Status:   Future    Expected Date:   10/30/2024    Expiration Date:   01/28/2025   Keep currently scheduled appointment.  All questions were answered. The patient knows to call the clinic with any problems, questions or concerns.  Zelphia Cap, MD, PhD [facility] Hematology Oncology 10/09/2024   HISTORY OF PRESENTING ILLNESS:   Erika Solomon is a  29 y.o.  female with PMH listed below was seen in consultation at the request of  Manya Toribio SQUIBB, PA  for evaluation of iron  deficiency anemia  Discussed the use of AI scribe software for clinical note transcription with the patient, who gave verbal consent to proceed.    She has a history of chronic iron  deficiency anemia, first identified during her first pregnancy in 61. Her hemoglobin level was 9.9 g/dL as of July 30, 2024. She has experienced chronic anemia since at least 2015, with  intermittent improvement in 2019 and 2020, but a decline in 2023 and 2024. She has been taking iron  supplements since April 2025, but they have not been effective. Previous treatments include blood transfusions and iron  infusions, such as Venofer , which provided temporary improvement.  She reports heavy menstrual periods, which have improved since she began losing weight in April 2025. Her periods are now heavy for the first two days and then become light.  She started menstruating at age 51 and recalls her periods being heavy from the onset. No major abdominal surgeries other than C-sections and no known history of celiac disease or fibroids.  She has had four pregnancies, with her youngest child being almost two years old. During her pregnancies, she required iron  transfusions. There is a family history of blood and iron  issues among the women in her family.  She experiences cravings for ice. She denies taking ibuprofen  regularly and reports no significant alcohol use. She mentions a recent issue with the corners of her mouth splitting. No rectal bleeding and denies any significant acid reflux. She reports frequent gum bleeding and occasional epistaxis.   INTERVAL HISTORY Erika Solomon is a 29 y.o. female who has above history reviewed by me today presents for acute visit for evaluation of painful lump under her left breast  Patient is here for iron  infusion.  She continues to have a heavy menstrual period especially  on first 2 days.  She has finished 3 IV Venofer  treatments and tolerated well.  Yesterday her primary care provider checked H&H which did not show much improvement.  Today she finished fourth iron  infusion.  She mentioned to RN about painful lump under her left breast which started about a week ago.  No fever or chills.  She denies having any similar symptoms in the past.  MEDICAL HISTORY:  Past Medical History:  Diagnosis Date   Allergy    Anemia    Anxiety and depression     Asthma    Blood transfusion without reported diagnosis    Gestational diabetes    Hx of preeclampsia, prior pregnancy, currently pregnant    Hypertension    Macrosomia    Panic disorder    Postpartum care following cesarean delivery 12/26/2016   Shoulder dystocia, delivered     SURGICAL HISTORY: Past Surgical History:  Procedure Laterality Date   CESAREAN SECTION N/A 12/23/2016   Procedure: CESAREAN SECTION;  Surgeon: Glory High, MD;  Location: [facility] ORS;  Service: Obstetrics;  Laterality: N/A;   CESAREAN SECTION N/A 12/31/2022   Procedure: REPEAT CESAREAN DELIVERY;  Surgeon: Janit Alm Agent, MD;  Location: [facility] ORS;  Service: Obstetrics;  Laterality: N/A;   WISDOM TOOTH EXTRACTION     four; 2018    SOCIAL HISTORY: Social History   Socioeconomic History   Marital status: Significant Other    Spouse name: Franky Beam   Number of children: 4   Years of education: 13.5   Highest education level: Associate degree: academic program  Occupational History   Occupation: lawyer  Tobacco Use   Smoking status: Never   Smokeless tobacco: Never  Vaping Use   Vaping status: Never Used  Substance and Sexual Activity   Alcohol use: No   Drug use: No   Sexual activity: Yes    Partners: Male    Birth control/protection: None  Other Topics Concern   Not on file  Social History Narrative   Not on file   Social Drivers of Health   Tobacco Use: Low Risk (10/05/2024)   Patient History    Smoking Tobacco Use: Never    Smokeless Tobacco Use: Never    Passive Exposure: Not on file  Financial Resource Strain: Low Risk (09/04/2024)   Overall Financial Resource Strain (CARDIA)    Difficulty of Paying Living Expenses: Not very hard  Food Insecurity: No Food Insecurity (09/04/2024)   Epic    Worried About Radiation Protection Practitioner of Food in the Last Year: Never true    Ran Out of Food in the Last Year: Never true  Transportation Needs: No Transportation Needs (09/04/2024)   Epic     Lack of Transportation (Medical): No    Lack of Transportation (Non-Medical): No  Physical Activity: Insufficiently Active (07/29/2024)   Exercise Vital Sign    Days of Exercise per Week: 3 days    Minutes of Exercise per Session: 30 min  Stress: No Stress Concern Present (09/04/2024)   [employer] of Occupational Health - Occupational Stress Questionnaire    Feeling of Stress: Not at all  Recent Concern: Stress - Stress Concern Present (07/29/2024)   [employer] of Occupational Health - Occupational Stress Questionnaire    Feeling of Stress: To some extent  Social Connections: Socially Integrated (07/29/2024)   Social Connection and Isolation Panel    Frequency of Communication with Friends and Family: Twice a week    Frequency of Social Gatherings with Friends  and Family: Once a week    Attends Religious Services: More than 4 times per year    Active Member of Clubs or Organizations: Yes    Attends Banker Meetings: More than 4 times per year    Marital Status: Married  Catering Manager Violence: Not At Risk (09/04/2024)   Epic    Fear of Current or Ex-Partner: No    Emotionally Abused: No    Physically Abused: No    Sexually Abused: No  Depression (PHQ2-9): Low Risk (10/09/2024)   Depression (PHQ2-9)    PHQ-2 Score: 1  Alcohol Screen: Low Risk (07/29/2024)   Alcohol Screen    Last Alcohol Screening Score (AUDIT): 0  Housing: Low Risk (09/04/2024)   Epic    Unable to Pay for Housing in the Last Year: No    Number of Times Moved in the Last Year: 0    Homeless in the Last Year: No  Utilities: Not At Risk (09/04/2024)   Epic    Threatened with loss of utilities: No  Health Literacy: Adequate Health Literacy (09/04/2024)   B1300 Health Literacy    Frequency of need for help with medical instructions: Never    FAMILY HISTORY: Family History  Problem Relation Age of Onset   Diabetes Mother    Thyroid disease Mother    Epilepsy Mother    Anxiety  disorder Mother    Miscarriages / Stillbirths Mother    Hypertension Father    Alcohol abuse Father    Depression Father    Sickle cell anemia Sister    Hypertension Brother    Autism spectrum disorder Brother    ADD / ADHD Brother    Asthma Brother    Intellectual disability Brother    Hypertension Maternal Grandmother    Arthritis Maternal Grandmother    Diabetes Maternal Grandfather    COPD Maternal Grandfather    Stroke Maternal Grandfather    Diabetes Paternal Grandmother    Other Paternal Grandmother        problem c kidneys and liver   Hypertension Paternal Grandfather    ADD / ADHD Son    ADD / ADHD Son    Cancer Maternal Aunt    Intellectual disability Sister     ALLERGIES:  is allergic to albuterol, benadryl  [diphenhydramine ], and strawberry extract.  MEDICATIONS:  Current Outpatient Medications  Medication Sig Dispense Refill   amoxicillin -clavulanate (AUGMENTIN ) 875-125 MG tablet Take 1 tablet by mouth 2 (two) times daily. 10 tablet 0   clotrimazole  (GYNE-LOTRIMIN ) 1 % vaginal cream Place 1 Applicatorful vaginally at bedtime for 10 days. 45 g 0   OVER THE COUNTER MEDICATION Iron  Bisglycinate     valACYclovir  (VALTREX ) 1000 MG tablet Take 2 tablets (2,000 mg total) by mouth 2 (two) times daily. Take 2 tablets every 12h for ONE day at the start of a cold sore, then 1 tablet every 12h for the following day, then stop. 30 tablet 1   No current facility-administered medications for this visit.    Review of Systems  Constitutional:  Positive for fatigue. Negative for appetite change, chills and fever.  HENT:   Negative for hearing loss and voice change.   Eyes:  Negative for eye problems.  Respiratory:  Negative for chest tightness and cough.   Cardiovascular:  Negative for chest pain.  Gastrointestinal:  Negative for abdominal distention, abdominal pain and blood in stool.  Endocrine: Negative for hot flashes.  Genitourinary:  Positive for menstrual problem.  Negative for  difficulty urinating and frequency.   Musculoskeletal:  Negative for arthralgias.  Skin:  Negative for itching and rash.  Neurological:  Negative for extremity weakness.  Hematological:  Negative for adenopathy.  Psychiatric/Behavioral:  Negative for confusion.    PHYSICAL EXAMINATION:  There were no vitals filed for this visit.  There were no vitals filed for this visit.  Physical Exam Constitutional:      General: She is not in acute distress. HENT:     Head: Normocephalic and atraumatic.  Eyes:     General: No scleral icterus. Cardiovascular:     Rate and Rhythm: Normal rate and regular rhythm.  Pulmonary:     Effort: Pulmonary effort is normal. No respiratory distress.     Breath sounds: Normal breath sounds.  Abdominal:     General: There is no distension.  Musculoskeletal:        General: No deformity. Normal range of motion.     Cervical back: Normal range of motion and neck supple.  Skin:    Findings: No erythema.  Neurological:     Mental Status: She is alert and oriented to person, place, and time. Mental status is at baseline.  Psychiatric:        Mood and Affect: Mood normal.   Left axilla subcutaneous nodule with tenderness.  No surrounding erythema or discharge.  LABORATORY DATA:  I have reviewed the data as listed    Latest Ref Rng & Units 10/05/2024    2:54 PM 07/30/2024    8:48 AM 05/04/2024    2:02 PM  CBC  WBC 3.4 - 10.8 x10E3/uL  7.4  6.6   Hemoglobin 11.1 - 15.9 g/dL 9.6  9.9  88.3   Hematocrit 34.0 - 46.6 % 30.7  32.9  38.1   Platelets 150 - 450 x10E3/uL  267  268       Latest Ref Rng & Units 01/13/2024   10:50 AM 04/05/2023    5:44 PM 12/15/2022    1:21 PM  CMP  Glucose 70 - 99 mg/dL 96  886  76   BUN 6 - 20 mg/dL 10  11  5    Creatinine 0.57 - 1.00 mg/dL 9.27  9.12  9.51   Sodium 134 - 144 mmol/L 142  134  135   Potassium 3.5 - 5.2 mmol/L 5.0  3.4  3.8   Chloride 96 - 106 mmol/L 107  101  108   CO2 20 - 29 mmol/L 20  22  22     Calcium 8.7 - 10.2 mg/dL 9.5  8.7  9.2   Total Protein 6.0 - 8.5 g/dL 7.5  8.1  6.7   Total Bilirubin 0.0 - 1.2 mg/dL 0.4  1.0  0.5   Alkaline Phos 44 - 121 IU/L 55  69  68   AST 0 - 40 IU/L 22  59  16   ALT 0 - 32 IU/L 24  72  11       RADIOGRAPHIC STUDIES: I have personally reviewed the radiological images as listed and agreed with the findings in the report. No results found.       "

## 2024-10-09 NOTE — Assessment & Plan Note (Addendum)
 Lab Results  Component Value Date   HGB 9.6 (L) 10/05/2024   TIBC 446 07/30/2024   IRONPCTSAT 6 (LL) 07/30/2024   FERRITIN 6 (L) 07/30/2024    S/p weekly Venofer  x 4. Interval H&H after 3 treatments did not show any improvement.  Will repeat another cbc in 3-4 weeks, check VWD, PT/PTT.

## 2024-10-10 ENCOUNTER — Other Ambulatory Visit: Payer: Self-pay

## 2024-10-10 ENCOUNTER — Telehealth: Payer: Self-pay

## 2024-10-10 NOTE — Telephone Encounter (Signed)
-----   Message from [place] E sent at 10/10/2024  8:43 AM EST ----- I scheduled lab appt and left voicemail for pt with appt details. ----- Message ----- From: Babara Call, MD Sent: 10/09/2024  10:18 PM EST To: Almarie JINNY Nett, RN; Fonda KANDICE Sax, CMA#  Please arrange her to get a lab encounter around 10/30/24 for interval blood work. She knows the plan.

## 2024-10-10 NOTE — Telephone Encounter (Signed)
 Please review.  KP

## 2024-10-15 ENCOUNTER — Other Ambulatory Visit: Payer: Self-pay

## 2024-10-15 ENCOUNTER — Telehealth: Payer: Self-pay | Admitting: Oncology

## 2024-10-15 DIAGNOSIS — D509 Iron deficiency anemia, unspecified: Secondary | ICD-10-CM

## 2024-10-15 DIAGNOSIS — N920 Excessive and frequent menstruation with regular cycle: Secondary | ICD-10-CM

## 2024-10-15 NOTE — Telephone Encounter (Signed)
 pt called and cancel lab Lab urine preg testing on 1/13 but it keeping the iron  appt.

## 2024-10-16 ENCOUNTER — Telehealth: Payer: Self-pay | Admitting: Pharmacy Technician

## 2024-10-16 ENCOUNTER — Other Ambulatory Visit (HOSPITAL_COMMUNITY): Payer: Self-pay

## 2024-10-16 ENCOUNTER — Encounter: Payer: Self-pay | Admitting: Oncology

## 2024-10-16 ENCOUNTER — Other Ambulatory Visit: Payer: Self-pay

## 2024-10-16 ENCOUNTER — Inpatient Hospital Stay

## 2024-10-16 VITALS — BP 134/88 | HR 82 | Temp 97.8°F | Resp 18

## 2024-10-16 DIAGNOSIS — D509 Iron deficiency anemia, unspecified: Secondary | ICD-10-CM

## 2024-10-16 MED ORDER — IRON SUCROSE 20 MG/ML IV SOLN
200.0000 mg | Freq: Once | INTRAVENOUS | Status: AC
  Administered 2024-10-16: 200 mg via INTRAVENOUS
  Filled 2024-10-16: qty 10

## 2024-10-16 MED ORDER — SODIUM CHLORIDE 0.9% FLUSH
10.0000 mL | Freq: Once | INTRAVENOUS | Status: AC | PRN
  Administered 2024-10-16: 10 mL
  Filled 2024-10-16: qty 10

## 2024-10-16 NOTE — Telephone Encounter (Signed)
 Please clarify which medication and strength PA is required on.

## 2024-10-16 NOTE — Telephone Encounter (Signed)
 Pharmacy Patient Advocate Encounter   Received notification from Patient Advice Request messages that prior authorization for Wegovy  0.25MG /0.5ML auto-injectors is required/requested.   Insurance verification completed.   The patient is insured through [employer].   Per test claim: PA required; PA started via CoverMyMeds. KEY [id number] . Waiting for clinical questions to populate.

## 2024-10-16 NOTE — Progress Notes (Signed)
 Patient declined urine pregnancy test today. She states there is no chance of pregnancy.

## 2024-10-16 NOTE — Telephone Encounter (Signed)
 PA request has been Submitted. New Encounter has been or will be created for follow up. For additional info see Pharmacy Prior Auth telephone encounter from 10/16/24.

## 2024-10-17 ENCOUNTER — Other Ambulatory Visit: Payer: Self-pay | Admitting: Physician Assistant

## 2024-10-17 ENCOUNTER — Other Ambulatory Visit: Payer: Self-pay

## 2024-10-17 ENCOUNTER — Other Ambulatory Visit (HOSPITAL_COMMUNITY): Payer: Self-pay

## 2024-10-17 DIAGNOSIS — E66812 Obesity, class 2: Secondary | ICD-10-CM

## 2024-10-17 MED ORDER — WEGOVY 0.25 MG/0.5ML ~~LOC~~ SOAJ
0.2500 mg | SUBCUTANEOUS | 0 refills | Status: AC
  Filled 2024-10-17: qty 2, 28d supply, fill #0

## 2024-10-17 NOTE — Telephone Encounter (Signed)
 Pharmacy Patient Advocate Encounter  Received notification from [employer] that Prior Authorization for Wegovy  0.25MG /0.5ML auto-injectors has been APPROVED from 10/17/24 to 10/17/25. Ran test claim, Copay is $4.00. This test claim was processed through [facility]- copay amounts may vary at other pharmacies due to pharmacy/plan contracts, or as the patient moves through the different stages of their insurance plan.   PA #/Case ID/Reference #: [id number]

## 2024-10-17 NOTE — Telephone Encounter (Signed)
 Wegovy  approved. Ok to send in new prescription? Not on current medication list.  KP

## 2024-10-17 NOTE — Telephone Encounter (Signed)
 Sent pt message on mychart.  KP

## 2024-10-30 ENCOUNTER — Telehealth: Payer: Self-pay | Admitting: Oncology

## 2024-10-30 ENCOUNTER — Inpatient Hospital Stay

## 2024-10-30 NOTE — Telephone Encounter (Signed)
 Pt called to r/s her lab appt from today 1/27 to different day (1/30) due to weather and not able to get out of her driveway. New appt date/time confirmed

## 2024-11-02 ENCOUNTER — Inpatient Hospital Stay

## 2024-11-02 ENCOUNTER — Telehealth: Payer: Self-pay | Admitting: Oncology

## 2024-11-02 NOTE — Telephone Encounter (Signed)
 Pt called to cancel lab for today 1/30 due to weather. Pt will call back to r/s once the weather is better

## 2024-12-03 ENCOUNTER — Inpatient Hospital Stay

## 2024-12-04 ENCOUNTER — Ambulatory Visit: Admitting: Physician Assistant

## 2024-12-10 ENCOUNTER — Inpatient Hospital Stay

## 2024-12-10 ENCOUNTER — Inpatient Hospital Stay: Admitting: Oncology

## 2024-12-24 ENCOUNTER — Inpatient Hospital Stay

## 2024-12-31 ENCOUNTER — Inpatient Hospital Stay

## 2024-12-31 ENCOUNTER — Inpatient Hospital Stay: Admitting: Oncology

## 2025-06-12 ENCOUNTER — Encounter: Admitting: Physician Assistant
